# Patient Record
Sex: Female | Born: 1955 | Race: Black or African American | Marital: Single | Smoking: Current every day smoker
Health system: Southern US, Community
[De-identification: ages and names within clinical notes are randomized; demographics above are authoritative.]

## PROBLEM LIST (undated history)

## (undated) DIAGNOSIS — G894 Chronic pain syndrome: Secondary | ICD-10-CM

## (undated) DIAGNOSIS — M810 Age-related osteoporosis without current pathological fracture: Secondary | ICD-10-CM

## (undated) DIAGNOSIS — G4730 Sleep apnea, unspecified: Secondary | ICD-10-CM

## (undated) DIAGNOSIS — F419 Anxiety disorder, unspecified: Secondary | ICD-10-CM

## (undated) DIAGNOSIS — J449 Chronic obstructive pulmonary disease, unspecified: Secondary | ICD-10-CM

## (undated) DIAGNOSIS — K219 Gastro-esophageal reflux disease without esophagitis: Secondary | ICD-10-CM

## (undated) DIAGNOSIS — M1990 Unspecified osteoarthritis, unspecified site: Secondary | ICD-10-CM

## (undated) DIAGNOSIS — I509 Heart failure, unspecified: Secondary | ICD-10-CM

## (undated) DIAGNOSIS — E785 Hyperlipidemia, unspecified: Secondary | ICD-10-CM

## (undated) DIAGNOSIS — I639 Cerebral infarction, unspecified: Secondary | ICD-10-CM

## (undated) DIAGNOSIS — Z5189 Encounter for other specified aftercare: Secondary | ICD-10-CM

## (undated) DIAGNOSIS — I10 Essential (primary) hypertension: Secondary | ICD-10-CM

## (undated) DIAGNOSIS — G709 Myoneural disorder, unspecified: Secondary | ICD-10-CM

## (undated) DIAGNOSIS — E079 Disorder of thyroid, unspecified: Secondary | ICD-10-CM

## (undated) DIAGNOSIS — B1920 Unspecified viral hepatitis C without hepatic coma: Secondary | ICD-10-CM

## (undated) HISTORY — DX: Encounter for other specified aftercare: Z51.89

## (undated) HISTORY — DX: Anxiety disorder, unspecified: F41.9

## (undated) HISTORY — DX: Hyperlipidemia, unspecified: E78.5

## (undated) HISTORY — DX: Sleep apnea, unspecified: G47.30

## (undated) HISTORY — DX: Chronic obstructive pulmonary disease, unspecified: J44.9

## (undated) HISTORY — PX: ABDOMINAL HYSTERECTOMY: SHX81

## (undated) HISTORY — PX: THYROID SURGERY: SHX805

## (undated) HISTORY — PX: CHOLECYSTECTOMY: SHX55

## (undated) HISTORY — DX: Age-related osteoporosis without current pathological fracture: M81.0

## (undated) HISTORY — DX: Disorder of thyroid, unspecified: E07.9

## (undated) HISTORY — PX: ABDOMINAL SURGERY: SHX537

## (undated) HISTORY — DX: Myoneural disorder, unspecified: G70.9

## (undated) HISTORY — DX: Gastro-esophageal reflux disease without esophagitis: K21.9

---

## 1994-11-21 DIAGNOSIS — I639 Cerebral infarction, unspecified: Secondary | ICD-10-CM

## 1994-11-21 HISTORY — DX: Cerebral infarction, unspecified: I63.9

## 2005-03-16 ENCOUNTER — Encounter: Payer: Self-pay | Admitting: Gastroenterology

## 2005-05-09 ENCOUNTER — Encounter: Payer: Self-pay | Admitting: Gastroenterology

## 2008-08-18 ENCOUNTER — Encounter
Admission: RE | Admit: 2008-08-18 | Discharge: 2008-08-18 | Payer: Self-pay | Admitting: Physical Medicine & Rehabilitation

## 2009-06-19 ENCOUNTER — Encounter: Payer: Self-pay | Admitting: Gastroenterology

## 2011-01-21 ENCOUNTER — Encounter: Payer: Self-pay | Admitting: Gastroenterology

## 2011-01-21 HISTORY — PX: ESOPHAGOGASTRODUODENOSCOPY: SHX1529

## 2014-12-26 ENCOUNTER — Encounter: Payer: Self-pay | Admitting: Gastroenterology

## 2014-12-26 HISTORY — PX: COLONOSCOPY: SHX174

## 2015-07-07 DIAGNOSIS — R32 Unspecified urinary incontinence: Secondary | ICD-10-CM | POA: Diagnosis not present

## 2015-07-07 DIAGNOSIS — M170 Bilateral primary osteoarthritis of knee: Secondary | ICD-10-CM | POA: Diagnosis not present

## 2015-07-07 DIAGNOSIS — F11220 Opioid dependence with intoxication, uncomplicated: Secondary | ICD-10-CM | POA: Diagnosis not present

## 2015-07-07 DIAGNOSIS — Z79891 Long term (current) use of opiate analgesic: Secondary | ICD-10-CM | POA: Diagnosis not present

## 2015-07-07 DIAGNOSIS — E538 Deficiency of other specified B group vitamins: Secondary | ICD-10-CM | POA: Diagnosis not present

## 2015-07-07 DIAGNOSIS — G43909 Migraine, unspecified, not intractable, without status migrainosus: Secondary | ICD-10-CM | POA: Diagnosis not present

## 2015-07-07 DIAGNOSIS — M5137 Other intervertebral disc degeneration, lumbosacral region: Secondary | ICD-10-CM | POA: Diagnosis not present

## 2015-07-09 DIAGNOSIS — M25552 Pain in left hip: Secondary | ICD-10-CM | POA: Diagnosis not present

## 2015-07-12 DIAGNOSIS — R32 Unspecified urinary incontinence: Secondary | ICD-10-CM | POA: Diagnosis not present

## 2015-07-12 DIAGNOSIS — I10 Essential (primary) hypertension: Secondary | ICD-10-CM | POA: Diagnosis not present

## 2015-07-12 DIAGNOSIS — F419 Anxiety disorder, unspecified: Secondary | ICD-10-CM | POA: Diagnosis not present

## 2015-07-12 DIAGNOSIS — G43909 Migraine, unspecified, not intractable, without status migrainosus: Secondary | ICD-10-CM | POA: Diagnosis not present

## 2015-07-12 DIAGNOSIS — M19012 Primary osteoarthritis, left shoulder: Secondary | ICD-10-CM | POA: Diagnosis not present

## 2015-07-12 DIAGNOSIS — G8194 Hemiplegia, unspecified affecting left nondominant side: Secondary | ICD-10-CM | POA: Diagnosis not present

## 2015-07-12 DIAGNOSIS — M170 Bilateral primary osteoarthritis of knee: Secondary | ICD-10-CM | POA: Diagnosis not present

## 2015-07-12 DIAGNOSIS — M5137 Other intervertebral disc degeneration, lumbosacral region: Secondary | ICD-10-CM | POA: Diagnosis not present

## 2015-07-12 DIAGNOSIS — J984 Other disorders of lung: Secondary | ICD-10-CM | POA: Diagnosis not present

## 2015-07-12 DIAGNOSIS — I509 Heart failure, unspecified: Secondary | ICD-10-CM | POA: Diagnosis not present

## 2015-07-12 DIAGNOSIS — Z720 Tobacco use: Secondary | ICD-10-CM | POA: Diagnosis not present

## 2015-07-12 DIAGNOSIS — M19011 Primary osteoarthritis, right shoulder: Secondary | ICD-10-CM | POA: Diagnosis not present

## 2015-07-12 DIAGNOSIS — F319 Bipolar disorder, unspecified: Secondary | ICD-10-CM | POA: Diagnosis not present

## 2015-07-13 DIAGNOSIS — M5432 Sciatica, left side: Secondary | ICD-10-CM | POA: Diagnosis not present

## 2015-07-13 DIAGNOSIS — M7062 Trochanteric bursitis, left hip: Secondary | ICD-10-CM | POA: Diagnosis not present

## 2015-07-13 DIAGNOSIS — M5137 Other intervertebral disc degeneration, lumbosacral region: Secondary | ICD-10-CM | POA: Diagnosis not present

## 2015-07-14 DIAGNOSIS — M5137 Other intervertebral disc degeneration, lumbosacral region: Secondary | ICD-10-CM | POA: Diagnosis not present

## 2015-07-15 DIAGNOSIS — I509 Heart failure, unspecified: Secondary | ICD-10-CM | POA: Diagnosis not present

## 2015-07-15 DIAGNOSIS — J984 Other disorders of lung: Secondary | ICD-10-CM | POA: Diagnosis not present

## 2015-07-15 DIAGNOSIS — F419 Anxiety disorder, unspecified: Secondary | ICD-10-CM | POA: Diagnosis not present

## 2015-07-15 DIAGNOSIS — G43909 Migraine, unspecified, not intractable, without status migrainosus: Secondary | ICD-10-CM | POA: Diagnosis not present

## 2015-07-15 DIAGNOSIS — F319 Bipolar disorder, unspecified: Secondary | ICD-10-CM | POA: Diagnosis not present

## 2015-07-15 DIAGNOSIS — I10 Essential (primary) hypertension: Secondary | ICD-10-CM | POA: Diagnosis not present

## 2015-07-15 DIAGNOSIS — Z720 Tobacco use: Secondary | ICD-10-CM | POA: Diagnosis not present

## 2015-07-15 DIAGNOSIS — M170 Bilateral primary osteoarthritis of knee: Secondary | ICD-10-CM | POA: Diagnosis not present

## 2015-07-15 DIAGNOSIS — M5137 Other intervertebral disc degeneration, lumbosacral region: Secondary | ICD-10-CM | POA: Diagnosis not present

## 2015-07-15 DIAGNOSIS — R32 Unspecified urinary incontinence: Secondary | ICD-10-CM | POA: Diagnosis not present

## 2015-07-15 DIAGNOSIS — M19011 Primary osteoarthritis, right shoulder: Secondary | ICD-10-CM | POA: Diagnosis not present

## 2015-07-15 DIAGNOSIS — G8194 Hemiplegia, unspecified affecting left nondominant side: Secondary | ICD-10-CM | POA: Diagnosis not present

## 2015-07-15 DIAGNOSIS — M19012 Primary osteoarthritis, left shoulder: Secondary | ICD-10-CM | POA: Diagnosis not present

## 2015-07-16 DIAGNOSIS — M5137 Other intervertebral disc degeneration, lumbosacral region: Secondary | ICD-10-CM | POA: Diagnosis not present

## 2015-07-17 DIAGNOSIS — M5137 Other intervertebral disc degeneration, lumbosacral region: Secondary | ICD-10-CM | POA: Diagnosis not present

## 2015-07-18 DIAGNOSIS — M5136 Other intervertebral disc degeneration, lumbar region: Secondary | ICD-10-CM | POA: Diagnosis not present

## 2015-07-18 DIAGNOSIS — M5126 Other intervertebral disc displacement, lumbar region: Secondary | ICD-10-CM | POA: Diagnosis not present

## 2015-07-18 DIAGNOSIS — M5137 Other intervertebral disc degeneration, lumbosacral region: Secondary | ICD-10-CM | POA: Diagnosis not present

## 2015-07-19 DIAGNOSIS — M5137 Other intervertebral disc degeneration, lumbosacral region: Secondary | ICD-10-CM | POA: Diagnosis not present

## 2015-07-20 DIAGNOSIS — M5137 Other intervertebral disc degeneration, lumbosacral region: Secondary | ICD-10-CM | POA: Diagnosis not present

## 2015-07-21 DIAGNOSIS — F419 Anxiety disorder, unspecified: Secondary | ICD-10-CM | POA: Diagnosis not present

## 2015-07-21 DIAGNOSIS — G43909 Migraine, unspecified, not intractable, without status migrainosus: Secondary | ICD-10-CM | POA: Diagnosis not present

## 2015-07-21 DIAGNOSIS — I10 Essential (primary) hypertension: Secondary | ICD-10-CM | POA: Diagnosis not present

## 2015-07-21 DIAGNOSIS — M170 Bilateral primary osteoarthritis of knee: Secondary | ICD-10-CM | POA: Diagnosis not present

## 2015-07-21 DIAGNOSIS — M5137 Other intervertebral disc degeneration, lumbosacral region: Secondary | ICD-10-CM | POA: Diagnosis not present

## 2015-07-21 DIAGNOSIS — R32 Unspecified urinary incontinence: Secondary | ICD-10-CM | POA: Diagnosis not present

## 2015-07-21 DIAGNOSIS — F319 Bipolar disorder, unspecified: Secondary | ICD-10-CM | POA: Diagnosis not present

## 2015-07-21 DIAGNOSIS — G8194 Hemiplegia, unspecified affecting left nondominant side: Secondary | ICD-10-CM | POA: Diagnosis not present

## 2015-07-21 DIAGNOSIS — Z720 Tobacco use: Secondary | ICD-10-CM | POA: Diagnosis not present

## 2015-07-21 DIAGNOSIS — M19012 Primary osteoarthritis, left shoulder: Secondary | ICD-10-CM | POA: Diagnosis not present

## 2015-07-21 DIAGNOSIS — J984 Other disorders of lung: Secondary | ICD-10-CM | POA: Diagnosis not present

## 2015-07-21 DIAGNOSIS — M19011 Primary osteoarthritis, right shoulder: Secondary | ICD-10-CM | POA: Diagnosis not present

## 2015-07-21 DIAGNOSIS — I509 Heart failure, unspecified: Secondary | ICD-10-CM | POA: Diagnosis not present

## 2015-07-22 DIAGNOSIS — M5137 Other intervertebral disc degeneration, lumbosacral region: Secondary | ICD-10-CM | POA: Diagnosis not present

## 2015-07-22 DIAGNOSIS — R32 Unspecified urinary incontinence: Secondary | ICD-10-CM | POA: Diagnosis not present

## 2015-07-23 DIAGNOSIS — G8194 Hemiplegia, unspecified affecting left nondominant side: Secondary | ICD-10-CM | POA: Diagnosis not present

## 2015-07-23 DIAGNOSIS — M5137 Other intervertebral disc degeneration, lumbosacral region: Secondary | ICD-10-CM | POA: Diagnosis not present

## 2015-07-23 DIAGNOSIS — M170 Bilateral primary osteoarthritis of knee: Secondary | ICD-10-CM | POA: Diagnosis not present

## 2015-07-23 DIAGNOSIS — I509 Heart failure, unspecified: Secondary | ICD-10-CM | POA: Diagnosis not present

## 2015-07-23 DIAGNOSIS — J984 Other disorders of lung: Secondary | ICD-10-CM | POA: Diagnosis not present

## 2015-07-23 DIAGNOSIS — F319 Bipolar disorder, unspecified: Secondary | ICD-10-CM | POA: Diagnosis not present

## 2015-07-23 DIAGNOSIS — M19011 Primary osteoarthritis, right shoulder: Secondary | ICD-10-CM | POA: Diagnosis not present

## 2015-07-23 DIAGNOSIS — Z720 Tobacco use: Secondary | ICD-10-CM | POA: Diagnosis not present

## 2015-07-23 DIAGNOSIS — F419 Anxiety disorder, unspecified: Secondary | ICD-10-CM | POA: Diagnosis not present

## 2015-07-23 DIAGNOSIS — R32 Unspecified urinary incontinence: Secondary | ICD-10-CM | POA: Diagnosis not present

## 2015-07-23 DIAGNOSIS — G43909 Migraine, unspecified, not intractable, without status migrainosus: Secondary | ICD-10-CM | POA: Diagnosis not present

## 2015-07-23 DIAGNOSIS — M19012 Primary osteoarthritis, left shoulder: Secondary | ICD-10-CM | POA: Diagnosis not present

## 2015-07-23 DIAGNOSIS — I10 Essential (primary) hypertension: Secondary | ICD-10-CM | POA: Diagnosis not present

## 2015-07-24 DIAGNOSIS — M5137 Other intervertebral disc degeneration, lumbosacral region: Secondary | ICD-10-CM | POA: Diagnosis not present

## 2015-07-25 DIAGNOSIS — M5137 Other intervertebral disc degeneration, lumbosacral region: Secondary | ICD-10-CM | POA: Diagnosis not present

## 2015-07-26 DIAGNOSIS — M5137 Other intervertebral disc degeneration, lumbosacral region: Secondary | ICD-10-CM | POA: Diagnosis not present

## 2015-07-27 DIAGNOSIS — M5137 Other intervertebral disc degeneration, lumbosacral region: Secondary | ICD-10-CM | POA: Diagnosis not present

## 2015-07-28 DIAGNOSIS — M5137 Other intervertebral disc degeneration, lumbosacral region: Secondary | ICD-10-CM | POA: Diagnosis not present

## 2015-07-29 DIAGNOSIS — M5137 Other intervertebral disc degeneration, lumbosacral region: Secondary | ICD-10-CM | POA: Diagnosis not present

## 2015-07-29 DIAGNOSIS — I872 Venous insufficiency (chronic) (peripheral): Secondary | ICD-10-CM | POA: Diagnosis not present

## 2015-07-29 DIAGNOSIS — D509 Iron deficiency anemia, unspecified: Secondary | ICD-10-CM | POA: Diagnosis not present

## 2015-07-30 DIAGNOSIS — F319 Bipolar disorder, unspecified: Secondary | ICD-10-CM | POA: Diagnosis not present

## 2015-07-30 DIAGNOSIS — M5137 Other intervertebral disc degeneration, lumbosacral region: Secondary | ICD-10-CM | POA: Diagnosis not present

## 2015-07-30 DIAGNOSIS — G43909 Migraine, unspecified, not intractable, without status migrainosus: Secondary | ICD-10-CM | POA: Diagnosis not present

## 2015-07-30 DIAGNOSIS — F419 Anxiety disorder, unspecified: Secondary | ICD-10-CM | POA: Diagnosis not present

## 2015-07-30 DIAGNOSIS — M170 Bilateral primary osteoarthritis of knee: Secondary | ICD-10-CM | POA: Diagnosis not present

## 2015-07-30 DIAGNOSIS — J984 Other disorders of lung: Secondary | ICD-10-CM | POA: Diagnosis not present

## 2015-07-30 DIAGNOSIS — Z720 Tobacco use: Secondary | ICD-10-CM | POA: Diagnosis not present

## 2015-07-30 DIAGNOSIS — M19011 Primary osteoarthritis, right shoulder: Secondary | ICD-10-CM | POA: Diagnosis not present

## 2015-07-30 DIAGNOSIS — I509 Heart failure, unspecified: Secondary | ICD-10-CM | POA: Diagnosis not present

## 2015-07-30 DIAGNOSIS — G8194 Hemiplegia, unspecified affecting left nondominant side: Secondary | ICD-10-CM | POA: Diagnosis not present

## 2015-07-30 DIAGNOSIS — M19012 Primary osteoarthritis, left shoulder: Secondary | ICD-10-CM | POA: Diagnosis not present

## 2015-07-30 DIAGNOSIS — R32 Unspecified urinary incontinence: Secondary | ICD-10-CM | POA: Diagnosis not present

## 2015-07-30 DIAGNOSIS — I10 Essential (primary) hypertension: Secondary | ICD-10-CM | POA: Diagnosis not present

## 2015-07-31 DIAGNOSIS — M5137 Other intervertebral disc degeneration, lumbosacral region: Secondary | ICD-10-CM | POA: Diagnosis not present

## 2015-08-01 DIAGNOSIS — M5137 Other intervertebral disc degeneration, lumbosacral region: Secondary | ICD-10-CM | POA: Diagnosis not present

## 2015-08-02 DIAGNOSIS — M5137 Other intervertebral disc degeneration, lumbosacral region: Secondary | ICD-10-CM | POA: Diagnosis not present

## 2015-08-03 DIAGNOSIS — G43909 Migraine, unspecified, not intractable, without status migrainosus: Secondary | ICD-10-CM | POA: Diagnosis not present

## 2015-08-03 DIAGNOSIS — M19011 Primary osteoarthritis, right shoulder: Secondary | ICD-10-CM | POA: Diagnosis not present

## 2015-08-03 DIAGNOSIS — F319 Bipolar disorder, unspecified: Secondary | ICD-10-CM | POA: Diagnosis not present

## 2015-08-03 DIAGNOSIS — I10 Essential (primary) hypertension: Secondary | ICD-10-CM | POA: Diagnosis not present

## 2015-08-03 DIAGNOSIS — I509 Heart failure, unspecified: Secondary | ICD-10-CM | POA: Diagnosis not present

## 2015-08-03 DIAGNOSIS — J984 Other disorders of lung: Secondary | ICD-10-CM | POA: Diagnosis not present

## 2015-08-03 DIAGNOSIS — G8194 Hemiplegia, unspecified affecting left nondominant side: Secondary | ICD-10-CM | POA: Diagnosis not present

## 2015-08-03 DIAGNOSIS — M19012 Primary osteoarthritis, left shoulder: Secondary | ICD-10-CM | POA: Diagnosis not present

## 2015-08-03 DIAGNOSIS — Z720 Tobacco use: Secondary | ICD-10-CM | POA: Diagnosis not present

## 2015-08-03 DIAGNOSIS — R32 Unspecified urinary incontinence: Secondary | ICD-10-CM | POA: Diagnosis not present

## 2015-08-03 DIAGNOSIS — F419 Anxiety disorder, unspecified: Secondary | ICD-10-CM | POA: Diagnosis not present

## 2015-08-03 DIAGNOSIS — M5137 Other intervertebral disc degeneration, lumbosacral region: Secondary | ICD-10-CM | POA: Diagnosis not present

## 2015-08-03 DIAGNOSIS — M170 Bilateral primary osteoarthritis of knee: Secondary | ICD-10-CM | POA: Diagnosis not present

## 2015-08-04 DIAGNOSIS — M5137 Other intervertebral disc degeneration, lumbosacral region: Secondary | ICD-10-CM | POA: Diagnosis not present

## 2015-08-05 DIAGNOSIS — M5137 Other intervertebral disc degeneration, lumbosacral region: Secondary | ICD-10-CM | POA: Diagnosis not present

## 2015-08-06 DIAGNOSIS — F11220 Opioid dependence with intoxication, uncomplicated: Secondary | ICD-10-CM | POA: Diagnosis not present

## 2015-08-06 DIAGNOSIS — G8194 Hemiplegia, unspecified affecting left nondominant side: Secondary | ICD-10-CM | POA: Diagnosis not present

## 2015-08-06 DIAGNOSIS — M5137 Other intervertebral disc degeneration, lumbosacral region: Secondary | ICD-10-CM | POA: Diagnosis not present

## 2015-08-06 DIAGNOSIS — M5432 Sciatica, left side: Secondary | ICD-10-CM | POA: Diagnosis not present

## 2015-08-06 DIAGNOSIS — Z23 Encounter for immunization: Secondary | ICD-10-CM | POA: Diagnosis not present

## 2015-08-06 DIAGNOSIS — Z79891 Long term (current) use of opiate analgesic: Secondary | ICD-10-CM | POA: Diagnosis not present

## 2015-08-06 DIAGNOSIS — G43909 Migraine, unspecified, not intractable, without status migrainosus: Secondary | ICD-10-CM | POA: Diagnosis not present

## 2015-08-06 DIAGNOSIS — I63311 Cerebral infarction due to thrombosis of right middle cerebral artery: Secondary | ICD-10-CM | POA: Diagnosis not present

## 2015-08-06 DIAGNOSIS — R32 Unspecified urinary incontinence: Secondary | ICD-10-CM | POA: Diagnosis not present

## 2015-08-07 DIAGNOSIS — M5137 Other intervertebral disc degeneration, lumbosacral region: Secondary | ICD-10-CM | POA: Diagnosis not present

## 2015-08-08 DIAGNOSIS — M5137 Other intervertebral disc degeneration, lumbosacral region: Secondary | ICD-10-CM | POA: Diagnosis not present

## 2015-08-09 DIAGNOSIS — M5137 Other intervertebral disc degeneration, lumbosacral region: Secondary | ICD-10-CM | POA: Diagnosis not present

## 2015-08-10 DIAGNOSIS — M5137 Other intervertebral disc degeneration, lumbosacral region: Secondary | ICD-10-CM | POA: Diagnosis not present

## 2015-08-11 DIAGNOSIS — M5137 Other intervertebral disc degeneration, lumbosacral region: Secondary | ICD-10-CM | POA: Diagnosis not present

## 2015-08-12 DIAGNOSIS — Z720 Tobacco use: Secondary | ICD-10-CM | POA: Diagnosis not present

## 2015-08-12 DIAGNOSIS — M170 Bilateral primary osteoarthritis of knee: Secondary | ICD-10-CM | POA: Diagnosis not present

## 2015-08-12 DIAGNOSIS — J984 Other disorders of lung: Secondary | ICD-10-CM | POA: Diagnosis not present

## 2015-08-12 DIAGNOSIS — M19012 Primary osteoarthritis, left shoulder: Secondary | ICD-10-CM | POA: Diagnosis not present

## 2015-08-12 DIAGNOSIS — F419 Anxiety disorder, unspecified: Secondary | ICD-10-CM | POA: Diagnosis not present

## 2015-08-12 DIAGNOSIS — M19011 Primary osteoarthritis, right shoulder: Secondary | ICD-10-CM | POA: Diagnosis not present

## 2015-08-12 DIAGNOSIS — R32 Unspecified urinary incontinence: Secondary | ICD-10-CM | POA: Diagnosis not present

## 2015-08-12 DIAGNOSIS — I509 Heart failure, unspecified: Secondary | ICD-10-CM | POA: Diagnosis not present

## 2015-08-12 DIAGNOSIS — M5137 Other intervertebral disc degeneration, lumbosacral region: Secondary | ICD-10-CM | POA: Diagnosis not present

## 2015-08-12 DIAGNOSIS — G8194 Hemiplegia, unspecified affecting left nondominant side: Secondary | ICD-10-CM | POA: Diagnosis not present

## 2015-08-12 DIAGNOSIS — G43909 Migraine, unspecified, not intractable, without status migrainosus: Secondary | ICD-10-CM | POA: Diagnosis not present

## 2015-08-12 DIAGNOSIS — F319 Bipolar disorder, unspecified: Secondary | ICD-10-CM | POA: Diagnosis not present

## 2015-08-12 DIAGNOSIS — I10 Essential (primary) hypertension: Secondary | ICD-10-CM | POA: Diagnosis not present

## 2015-08-13 DIAGNOSIS — M5137 Other intervertebral disc degeneration, lumbosacral region: Secondary | ICD-10-CM | POA: Diagnosis not present

## 2015-08-14 DIAGNOSIS — M19012 Primary osteoarthritis, left shoulder: Secondary | ICD-10-CM | POA: Diagnosis not present

## 2015-08-14 DIAGNOSIS — G8194 Hemiplegia, unspecified affecting left nondominant side: Secondary | ICD-10-CM | POA: Diagnosis not present

## 2015-08-14 DIAGNOSIS — G43909 Migraine, unspecified, not intractable, without status migrainosus: Secondary | ICD-10-CM | POA: Diagnosis not present

## 2015-08-14 DIAGNOSIS — I10 Essential (primary) hypertension: Secondary | ICD-10-CM | POA: Diagnosis not present

## 2015-08-14 DIAGNOSIS — M5137 Other intervertebral disc degeneration, lumbosacral region: Secondary | ICD-10-CM | POA: Diagnosis not present

## 2015-08-14 DIAGNOSIS — M19011 Primary osteoarthritis, right shoulder: Secondary | ICD-10-CM | POA: Diagnosis not present

## 2015-08-14 DIAGNOSIS — Z720 Tobacco use: Secondary | ICD-10-CM | POA: Diagnosis not present

## 2015-08-14 DIAGNOSIS — F419 Anxiety disorder, unspecified: Secondary | ICD-10-CM | POA: Diagnosis not present

## 2015-08-14 DIAGNOSIS — I509 Heart failure, unspecified: Secondary | ICD-10-CM | POA: Diagnosis not present

## 2015-08-14 DIAGNOSIS — R32 Unspecified urinary incontinence: Secondary | ICD-10-CM | POA: Diagnosis not present

## 2015-08-14 DIAGNOSIS — J984 Other disorders of lung: Secondary | ICD-10-CM | POA: Diagnosis not present

## 2015-08-14 DIAGNOSIS — F319 Bipolar disorder, unspecified: Secondary | ICD-10-CM | POA: Diagnosis not present

## 2015-08-14 DIAGNOSIS — M170 Bilateral primary osteoarthritis of knee: Secondary | ICD-10-CM | POA: Diagnosis not present

## 2015-08-15 DIAGNOSIS — M5137 Other intervertebral disc degeneration, lumbosacral region: Secondary | ICD-10-CM | POA: Diagnosis not present

## 2015-08-16 DIAGNOSIS — M5137 Other intervertebral disc degeneration, lumbosacral region: Secondary | ICD-10-CM | POA: Diagnosis not present

## 2015-08-17 DIAGNOSIS — M5137 Other intervertebral disc degeneration, lumbosacral region: Secondary | ICD-10-CM | POA: Diagnosis not present

## 2015-08-18 DIAGNOSIS — M19011 Primary osteoarthritis, right shoulder: Secondary | ICD-10-CM | POA: Diagnosis not present

## 2015-08-18 DIAGNOSIS — G43909 Migraine, unspecified, not intractable, without status migrainosus: Secondary | ICD-10-CM | POA: Diagnosis not present

## 2015-08-18 DIAGNOSIS — M5137 Other intervertebral disc degeneration, lumbosacral region: Secondary | ICD-10-CM | POA: Diagnosis not present

## 2015-08-18 DIAGNOSIS — I10 Essential (primary) hypertension: Secondary | ICD-10-CM | POA: Diagnosis not present

## 2015-08-18 DIAGNOSIS — F319 Bipolar disorder, unspecified: Secondary | ICD-10-CM | POA: Diagnosis not present

## 2015-08-18 DIAGNOSIS — I509 Heart failure, unspecified: Secondary | ICD-10-CM | POA: Diagnosis not present

## 2015-08-18 DIAGNOSIS — Z720 Tobacco use: Secondary | ICD-10-CM | POA: Diagnosis not present

## 2015-08-18 DIAGNOSIS — F419 Anxiety disorder, unspecified: Secondary | ICD-10-CM | POA: Diagnosis not present

## 2015-08-18 DIAGNOSIS — G8194 Hemiplegia, unspecified affecting left nondominant side: Secondary | ICD-10-CM | POA: Diagnosis not present

## 2015-08-18 DIAGNOSIS — M19012 Primary osteoarthritis, left shoulder: Secondary | ICD-10-CM | POA: Diagnosis not present

## 2015-08-18 DIAGNOSIS — R32 Unspecified urinary incontinence: Secondary | ICD-10-CM | POA: Diagnosis not present

## 2015-08-18 DIAGNOSIS — M170 Bilateral primary osteoarthritis of knee: Secondary | ICD-10-CM | POA: Diagnosis not present

## 2015-08-18 DIAGNOSIS — J984 Other disorders of lung: Secondary | ICD-10-CM | POA: Diagnosis not present

## 2015-08-19 DIAGNOSIS — M5137 Other intervertebral disc degeneration, lumbosacral region: Secondary | ICD-10-CM | POA: Diagnosis not present

## 2015-08-20 DIAGNOSIS — M5137 Other intervertebral disc degeneration, lumbosacral region: Secondary | ICD-10-CM | POA: Diagnosis not present

## 2015-08-21 DIAGNOSIS — M5137 Other intervertebral disc degeneration, lumbosacral region: Secondary | ICD-10-CM | POA: Diagnosis not present

## 2015-08-21 DIAGNOSIS — R32 Unspecified urinary incontinence: Secondary | ICD-10-CM | POA: Diagnosis not present

## 2015-08-22 DIAGNOSIS — M5137 Other intervertebral disc degeneration, lumbosacral region: Secondary | ICD-10-CM | POA: Diagnosis not present

## 2015-08-23 DIAGNOSIS — M5137 Other intervertebral disc degeneration, lumbosacral region: Secondary | ICD-10-CM | POA: Diagnosis not present

## 2015-08-24 DIAGNOSIS — M5137 Other intervertebral disc degeneration, lumbosacral region: Secondary | ICD-10-CM | POA: Diagnosis not present

## 2015-08-25 DIAGNOSIS — F419 Anxiety disorder, unspecified: Secondary | ICD-10-CM | POA: Diagnosis not present

## 2015-08-25 DIAGNOSIS — M19011 Primary osteoarthritis, right shoulder: Secondary | ICD-10-CM | POA: Diagnosis not present

## 2015-08-25 DIAGNOSIS — F319 Bipolar disorder, unspecified: Secondary | ICD-10-CM | POA: Diagnosis not present

## 2015-08-25 DIAGNOSIS — R32 Unspecified urinary incontinence: Secondary | ICD-10-CM | POA: Diagnosis not present

## 2015-08-25 DIAGNOSIS — M170 Bilateral primary osteoarthritis of knee: Secondary | ICD-10-CM | POA: Diagnosis not present

## 2015-08-25 DIAGNOSIS — I509 Heart failure, unspecified: Secondary | ICD-10-CM | POA: Diagnosis not present

## 2015-08-25 DIAGNOSIS — G43909 Migraine, unspecified, not intractable, without status migrainosus: Secondary | ICD-10-CM | POA: Diagnosis not present

## 2015-08-25 DIAGNOSIS — Z720 Tobacco use: Secondary | ICD-10-CM | POA: Diagnosis not present

## 2015-08-25 DIAGNOSIS — M5137 Other intervertebral disc degeneration, lumbosacral region: Secondary | ICD-10-CM | POA: Diagnosis not present

## 2015-08-25 DIAGNOSIS — I10 Essential (primary) hypertension: Secondary | ICD-10-CM | POA: Diagnosis not present

## 2015-08-25 DIAGNOSIS — J984 Other disorders of lung: Secondary | ICD-10-CM | POA: Diagnosis not present

## 2015-08-25 DIAGNOSIS — M19012 Primary osteoarthritis, left shoulder: Secondary | ICD-10-CM | POA: Diagnosis not present

## 2015-08-25 DIAGNOSIS — G8194 Hemiplegia, unspecified affecting left nondominant side: Secondary | ICD-10-CM | POA: Diagnosis not present

## 2015-08-26 DIAGNOSIS — J984 Other disorders of lung: Secondary | ICD-10-CM | POA: Diagnosis not present

## 2015-08-26 DIAGNOSIS — F319 Bipolar disorder, unspecified: Secondary | ICD-10-CM | POA: Diagnosis not present

## 2015-08-26 DIAGNOSIS — M170 Bilateral primary osteoarthritis of knee: Secondary | ICD-10-CM | POA: Diagnosis not present

## 2015-08-26 DIAGNOSIS — M19011 Primary osteoarthritis, right shoulder: Secondary | ICD-10-CM | POA: Diagnosis not present

## 2015-08-26 DIAGNOSIS — G43909 Migraine, unspecified, not intractable, without status migrainosus: Secondary | ICD-10-CM | POA: Diagnosis not present

## 2015-08-26 DIAGNOSIS — F419 Anxiety disorder, unspecified: Secondary | ICD-10-CM | POA: Diagnosis not present

## 2015-08-26 DIAGNOSIS — M5137 Other intervertebral disc degeneration, lumbosacral region: Secondary | ICD-10-CM | POA: Diagnosis not present

## 2015-08-26 DIAGNOSIS — Z720 Tobacco use: Secondary | ICD-10-CM | POA: Diagnosis not present

## 2015-08-26 DIAGNOSIS — R32 Unspecified urinary incontinence: Secondary | ICD-10-CM | POA: Diagnosis not present

## 2015-08-26 DIAGNOSIS — M19012 Primary osteoarthritis, left shoulder: Secondary | ICD-10-CM | POA: Diagnosis not present

## 2015-08-26 DIAGNOSIS — I10 Essential (primary) hypertension: Secondary | ICD-10-CM | POA: Diagnosis not present

## 2015-08-26 DIAGNOSIS — G8194 Hemiplegia, unspecified affecting left nondominant side: Secondary | ICD-10-CM | POA: Diagnosis not present

## 2015-08-26 DIAGNOSIS — I509 Heart failure, unspecified: Secondary | ICD-10-CM | POA: Diagnosis not present

## 2015-08-27 DIAGNOSIS — G43909 Migraine, unspecified, not intractable, without status migrainosus: Secondary | ICD-10-CM | POA: Diagnosis not present

## 2015-08-27 DIAGNOSIS — I509 Heart failure, unspecified: Secondary | ICD-10-CM | POA: Diagnosis not present

## 2015-08-27 DIAGNOSIS — M19012 Primary osteoarthritis, left shoulder: Secondary | ICD-10-CM | POA: Diagnosis not present

## 2015-08-27 DIAGNOSIS — F319 Bipolar disorder, unspecified: Secondary | ICD-10-CM | POA: Diagnosis not present

## 2015-08-27 DIAGNOSIS — Z720 Tobacco use: Secondary | ICD-10-CM | POA: Diagnosis not present

## 2015-08-27 DIAGNOSIS — M170 Bilateral primary osteoarthritis of knee: Secondary | ICD-10-CM | POA: Diagnosis not present

## 2015-08-27 DIAGNOSIS — M19011 Primary osteoarthritis, right shoulder: Secondary | ICD-10-CM | POA: Diagnosis not present

## 2015-08-27 DIAGNOSIS — F419 Anxiety disorder, unspecified: Secondary | ICD-10-CM | POA: Diagnosis not present

## 2015-08-27 DIAGNOSIS — I10 Essential (primary) hypertension: Secondary | ICD-10-CM | POA: Diagnosis not present

## 2015-08-27 DIAGNOSIS — R32 Unspecified urinary incontinence: Secondary | ICD-10-CM | POA: Diagnosis not present

## 2015-08-27 DIAGNOSIS — J984 Other disorders of lung: Secondary | ICD-10-CM | POA: Diagnosis not present

## 2015-08-27 DIAGNOSIS — G8194 Hemiplegia, unspecified affecting left nondominant side: Secondary | ICD-10-CM | POA: Diagnosis not present

## 2015-08-27 DIAGNOSIS — M5137 Other intervertebral disc degeneration, lumbosacral region: Secondary | ICD-10-CM | POA: Diagnosis not present

## 2015-08-28 DIAGNOSIS — M5137 Other intervertebral disc degeneration, lumbosacral region: Secondary | ICD-10-CM | POA: Diagnosis not present

## 2015-08-29 DIAGNOSIS — M5137 Other intervertebral disc degeneration, lumbosacral region: Secondary | ICD-10-CM | POA: Diagnosis not present

## 2015-08-30 DIAGNOSIS — M5137 Other intervertebral disc degeneration, lumbosacral region: Secondary | ICD-10-CM | POA: Diagnosis not present

## 2015-08-31 DIAGNOSIS — M5137 Other intervertebral disc degeneration, lumbosacral region: Secondary | ICD-10-CM | POA: Diagnosis not present

## 2015-09-01 DIAGNOSIS — I509 Heart failure, unspecified: Secondary | ICD-10-CM | POA: Diagnosis not present

## 2015-09-01 DIAGNOSIS — G43909 Migraine, unspecified, not intractable, without status migrainosus: Secondary | ICD-10-CM | POA: Diagnosis not present

## 2015-09-01 DIAGNOSIS — J984 Other disorders of lung: Secondary | ICD-10-CM | POA: Diagnosis not present

## 2015-09-01 DIAGNOSIS — F319 Bipolar disorder, unspecified: Secondary | ICD-10-CM | POA: Diagnosis not present

## 2015-09-01 DIAGNOSIS — Z720 Tobacco use: Secondary | ICD-10-CM | POA: Diagnosis not present

## 2015-09-01 DIAGNOSIS — M5137 Other intervertebral disc degeneration, lumbosacral region: Secondary | ICD-10-CM | POA: Diagnosis not present

## 2015-09-01 DIAGNOSIS — G8194 Hemiplegia, unspecified affecting left nondominant side: Secondary | ICD-10-CM | POA: Diagnosis not present

## 2015-09-01 DIAGNOSIS — I10 Essential (primary) hypertension: Secondary | ICD-10-CM | POA: Diagnosis not present

## 2015-09-01 DIAGNOSIS — F419 Anxiety disorder, unspecified: Secondary | ICD-10-CM | POA: Diagnosis not present

## 2015-09-01 DIAGNOSIS — M19011 Primary osteoarthritis, right shoulder: Secondary | ICD-10-CM | POA: Diagnosis not present

## 2015-09-01 DIAGNOSIS — R32 Unspecified urinary incontinence: Secondary | ICD-10-CM | POA: Diagnosis not present

## 2015-09-01 DIAGNOSIS — M170 Bilateral primary osteoarthritis of knee: Secondary | ICD-10-CM | POA: Diagnosis not present

## 2015-09-01 DIAGNOSIS — M19012 Primary osteoarthritis, left shoulder: Secondary | ICD-10-CM | POA: Diagnosis not present

## 2015-09-02 DIAGNOSIS — M5137 Other intervertebral disc degeneration, lumbosacral region: Secondary | ICD-10-CM | POA: Diagnosis not present

## 2015-09-03 DIAGNOSIS — M170 Bilateral primary osteoarthritis of knee: Secondary | ICD-10-CM | POA: Diagnosis not present

## 2015-09-03 DIAGNOSIS — M5137 Other intervertebral disc degeneration, lumbosacral region: Secondary | ICD-10-CM | POA: Diagnosis not present

## 2015-09-03 DIAGNOSIS — Z79899 Other long term (current) drug therapy: Secondary | ICD-10-CM | POA: Diagnosis not present

## 2015-09-03 DIAGNOSIS — Z79891 Long term (current) use of opiate analgesic: Secondary | ICD-10-CM | POA: Diagnosis not present

## 2015-09-03 DIAGNOSIS — G8194 Hemiplegia, unspecified affecting left nondominant side: Secondary | ICD-10-CM | POA: Diagnosis not present

## 2015-09-03 DIAGNOSIS — M19012 Primary osteoarthritis, left shoulder: Secondary | ICD-10-CM | POA: Diagnosis not present

## 2015-09-03 DIAGNOSIS — M19011 Primary osteoarthritis, right shoulder: Secondary | ICD-10-CM | POA: Diagnosis not present

## 2015-09-04 DIAGNOSIS — M5137 Other intervertebral disc degeneration, lumbosacral region: Secondary | ICD-10-CM | POA: Diagnosis not present

## 2015-09-05 DIAGNOSIS — M5137 Other intervertebral disc degeneration, lumbosacral region: Secondary | ICD-10-CM | POA: Diagnosis not present

## 2015-09-06 DIAGNOSIS — M5137 Other intervertebral disc degeneration, lumbosacral region: Secondary | ICD-10-CM | POA: Diagnosis not present

## 2015-09-07 DIAGNOSIS — G43909 Migraine, unspecified, not intractable, without status migrainosus: Secondary | ICD-10-CM | POA: Diagnosis not present

## 2015-09-07 DIAGNOSIS — M19011 Primary osteoarthritis, right shoulder: Secondary | ICD-10-CM | POA: Diagnosis not present

## 2015-09-07 DIAGNOSIS — Z792 Long term (current) use of antibiotics: Secondary | ICD-10-CM | POA: Diagnosis not present

## 2015-09-07 DIAGNOSIS — F319 Bipolar disorder, unspecified: Secondary | ICD-10-CM | POA: Diagnosis not present

## 2015-09-07 DIAGNOSIS — F419 Anxiety disorder, unspecified: Secondary | ICD-10-CM | POA: Diagnosis not present

## 2015-09-07 DIAGNOSIS — I110 Hypertensive heart disease with heart failure: Secondary | ICD-10-CM | POA: Diagnosis not present

## 2015-09-07 DIAGNOSIS — M5137 Other intervertebral disc degeneration, lumbosacral region: Secondary | ICD-10-CM | POA: Diagnosis not present

## 2015-09-07 DIAGNOSIS — I509 Heart failure, unspecified: Secondary | ICD-10-CM | POA: Diagnosis not present

## 2015-09-07 DIAGNOSIS — I69354 Hemiplegia and hemiparesis following cerebral infarction affecting left non-dominant side: Secondary | ICD-10-CM | POA: Diagnosis not present

## 2015-09-07 DIAGNOSIS — M19012 Primary osteoarthritis, left shoulder: Secondary | ICD-10-CM | POA: Diagnosis not present

## 2015-09-07 DIAGNOSIS — Z7902 Long term (current) use of antithrombotics/antiplatelets: Secondary | ICD-10-CM | POA: Diagnosis not present

## 2015-09-07 DIAGNOSIS — J449 Chronic obstructive pulmonary disease, unspecified: Secondary | ICD-10-CM | POA: Diagnosis not present

## 2015-09-07 DIAGNOSIS — M170 Bilateral primary osteoarthritis of knee: Secondary | ICD-10-CM | POA: Diagnosis not present

## 2015-09-08 DIAGNOSIS — M5137 Other intervertebral disc degeneration, lumbosacral region: Secondary | ICD-10-CM | POA: Diagnosis not present

## 2015-09-09 DIAGNOSIS — M5137 Other intervertebral disc degeneration, lumbosacral region: Secondary | ICD-10-CM | POA: Diagnosis not present

## 2015-09-10 DIAGNOSIS — M5137 Other intervertebral disc degeneration, lumbosacral region: Secondary | ICD-10-CM | POA: Diagnosis not present

## 2015-09-11 DIAGNOSIS — M5416 Radiculopathy, lumbar region: Secondary | ICD-10-CM | POA: Diagnosis not present

## 2015-09-11 DIAGNOSIS — M5137 Other intervertebral disc degeneration, lumbosacral region: Secondary | ICD-10-CM | POA: Diagnosis not present

## 2015-09-12 DIAGNOSIS — M5137 Other intervertebral disc degeneration, lumbosacral region: Secondary | ICD-10-CM | POA: Diagnosis not present

## 2015-09-13 DIAGNOSIS — M5137 Other intervertebral disc degeneration, lumbosacral region: Secondary | ICD-10-CM | POA: Diagnosis not present

## 2015-09-13 DIAGNOSIS — M5416 Radiculopathy, lumbar region: Secondary | ICD-10-CM | POA: Diagnosis not present

## 2015-09-13 DIAGNOSIS — M5441 Lumbago with sciatica, right side: Secondary | ICD-10-CM | POA: Diagnosis not present

## 2015-09-14 DIAGNOSIS — M170 Bilateral primary osteoarthritis of knee: Secondary | ICD-10-CM | POA: Diagnosis not present

## 2015-09-14 DIAGNOSIS — Z7902 Long term (current) use of antithrombotics/antiplatelets: Secondary | ICD-10-CM | POA: Diagnosis not present

## 2015-09-14 DIAGNOSIS — M19011 Primary osteoarthritis, right shoulder: Secondary | ICD-10-CM | POA: Diagnosis not present

## 2015-09-14 DIAGNOSIS — I110 Hypertensive heart disease with heart failure: Secondary | ICD-10-CM | POA: Diagnosis not present

## 2015-09-14 DIAGNOSIS — G43909 Migraine, unspecified, not intractable, without status migrainosus: Secondary | ICD-10-CM | POA: Diagnosis not present

## 2015-09-14 DIAGNOSIS — I509 Heart failure, unspecified: Secondary | ICD-10-CM | POA: Diagnosis not present

## 2015-09-14 DIAGNOSIS — Z792 Long term (current) use of antibiotics: Secondary | ICD-10-CM | POA: Diagnosis not present

## 2015-09-14 DIAGNOSIS — J449 Chronic obstructive pulmonary disease, unspecified: Secondary | ICD-10-CM | POA: Diagnosis not present

## 2015-09-14 DIAGNOSIS — M19012 Primary osteoarthritis, left shoulder: Secondary | ICD-10-CM | POA: Diagnosis not present

## 2015-09-14 DIAGNOSIS — M5137 Other intervertebral disc degeneration, lumbosacral region: Secondary | ICD-10-CM | POA: Diagnosis not present

## 2015-09-14 DIAGNOSIS — I69354 Hemiplegia and hemiparesis following cerebral infarction affecting left non-dominant side: Secondary | ICD-10-CM | POA: Diagnosis not present

## 2015-09-14 DIAGNOSIS — F319 Bipolar disorder, unspecified: Secondary | ICD-10-CM | POA: Diagnosis not present

## 2015-09-14 DIAGNOSIS — F419 Anxiety disorder, unspecified: Secondary | ICD-10-CM | POA: Diagnosis not present

## 2015-09-15 DIAGNOSIS — M5137 Other intervertebral disc degeneration, lumbosacral region: Secondary | ICD-10-CM | POA: Diagnosis not present

## 2015-09-16 DIAGNOSIS — M5137 Other intervertebral disc degeneration, lumbosacral region: Secondary | ICD-10-CM | POA: Diagnosis not present

## 2015-09-17 DIAGNOSIS — M5137 Other intervertebral disc degeneration, lumbosacral region: Secondary | ICD-10-CM | POA: Diagnosis not present

## 2015-09-17 DIAGNOSIS — M5416 Radiculopathy, lumbar region: Secondary | ICD-10-CM | POA: Diagnosis not present

## 2015-09-18 DIAGNOSIS — M19012 Primary osteoarthritis, left shoulder: Secondary | ICD-10-CM | POA: Diagnosis not present

## 2015-09-18 DIAGNOSIS — F419 Anxiety disorder, unspecified: Secondary | ICD-10-CM | POA: Diagnosis not present

## 2015-09-18 DIAGNOSIS — I69354 Hemiplegia and hemiparesis following cerebral infarction affecting left non-dominant side: Secondary | ICD-10-CM | POA: Diagnosis not present

## 2015-09-18 DIAGNOSIS — M170 Bilateral primary osteoarthritis of knee: Secondary | ICD-10-CM | POA: Diagnosis not present

## 2015-09-18 DIAGNOSIS — J449 Chronic obstructive pulmonary disease, unspecified: Secondary | ICD-10-CM | POA: Diagnosis not present

## 2015-09-18 DIAGNOSIS — F319 Bipolar disorder, unspecified: Secondary | ICD-10-CM | POA: Diagnosis not present

## 2015-09-18 DIAGNOSIS — M5137 Other intervertebral disc degeneration, lumbosacral region: Secondary | ICD-10-CM | POA: Diagnosis not present

## 2015-09-18 DIAGNOSIS — I110 Hypertensive heart disease with heart failure: Secondary | ICD-10-CM | POA: Diagnosis not present

## 2015-09-18 DIAGNOSIS — Z7902 Long term (current) use of antithrombotics/antiplatelets: Secondary | ICD-10-CM | POA: Diagnosis not present

## 2015-09-18 DIAGNOSIS — M19011 Primary osteoarthritis, right shoulder: Secondary | ICD-10-CM | POA: Diagnosis not present

## 2015-09-18 DIAGNOSIS — I509 Heart failure, unspecified: Secondary | ICD-10-CM | POA: Diagnosis not present

## 2015-09-18 DIAGNOSIS — G43909 Migraine, unspecified, not intractable, without status migrainosus: Secondary | ICD-10-CM | POA: Diagnosis not present

## 2015-09-18 DIAGNOSIS — Z792 Long term (current) use of antibiotics: Secondary | ICD-10-CM | POA: Diagnosis not present

## 2015-09-19 DIAGNOSIS — M5137 Other intervertebral disc degeneration, lumbosacral region: Secondary | ICD-10-CM | POA: Diagnosis not present

## 2015-09-20 DIAGNOSIS — M5137 Other intervertebral disc degeneration, lumbosacral region: Secondary | ICD-10-CM | POA: Diagnosis not present

## 2015-09-21 DIAGNOSIS — M5137 Other intervertebral disc degeneration, lumbosacral region: Secondary | ICD-10-CM | POA: Diagnosis not present

## 2015-09-22 DIAGNOSIS — J209 Acute bronchitis, unspecified: Secondary | ICD-10-CM | POA: Diagnosis not present

## 2015-09-22 DIAGNOSIS — R197 Diarrhea, unspecified: Secondary | ICD-10-CM | POA: Diagnosis not present

## 2015-09-22 DIAGNOSIS — J069 Acute upper respiratory infection, unspecified: Secondary | ICD-10-CM | POA: Diagnosis not present

## 2015-09-22 DIAGNOSIS — R05 Cough: Secondary | ICD-10-CM | POA: Diagnosis not present

## 2015-09-22 DIAGNOSIS — R112 Nausea with vomiting, unspecified: Secondary | ICD-10-CM | POA: Diagnosis not present

## 2015-09-25 DIAGNOSIS — M19012 Primary osteoarthritis, left shoulder: Secondary | ICD-10-CM | POA: Diagnosis not present

## 2015-09-25 DIAGNOSIS — M19011 Primary osteoarthritis, right shoulder: Secondary | ICD-10-CM | POA: Diagnosis not present

## 2015-09-25 DIAGNOSIS — J449 Chronic obstructive pulmonary disease, unspecified: Secondary | ICD-10-CM | POA: Diagnosis not present

## 2015-09-25 DIAGNOSIS — I110 Hypertensive heart disease with heart failure: Secondary | ICD-10-CM | POA: Diagnosis not present

## 2015-09-25 DIAGNOSIS — G43909 Migraine, unspecified, not intractable, without status migrainosus: Secondary | ICD-10-CM | POA: Diagnosis not present

## 2015-09-25 DIAGNOSIS — F419 Anxiety disorder, unspecified: Secondary | ICD-10-CM | POA: Diagnosis not present

## 2015-09-25 DIAGNOSIS — I509 Heart failure, unspecified: Secondary | ICD-10-CM | POA: Diagnosis not present

## 2015-09-25 DIAGNOSIS — I69354 Hemiplegia and hemiparesis following cerebral infarction affecting left non-dominant side: Secondary | ICD-10-CM | POA: Diagnosis not present

## 2015-09-25 DIAGNOSIS — M170 Bilateral primary osteoarthritis of knee: Secondary | ICD-10-CM | POA: Diagnosis not present

## 2015-09-25 DIAGNOSIS — Z7902 Long term (current) use of antithrombotics/antiplatelets: Secondary | ICD-10-CM | POA: Diagnosis not present

## 2015-09-25 DIAGNOSIS — F319 Bipolar disorder, unspecified: Secondary | ICD-10-CM | POA: Diagnosis not present

## 2015-09-25 DIAGNOSIS — Z792 Long term (current) use of antibiotics: Secondary | ICD-10-CM | POA: Diagnosis not present

## 2015-09-25 DIAGNOSIS — M5137 Other intervertebral disc degeneration, lumbosacral region: Secondary | ICD-10-CM | POA: Diagnosis not present

## 2015-09-28 DIAGNOSIS — G43909 Migraine, unspecified, not intractable, without status migrainosus: Secondary | ICD-10-CM | POA: Diagnosis not present

## 2015-09-28 DIAGNOSIS — Z7982 Long term (current) use of aspirin: Secondary | ICD-10-CM | POA: Diagnosis not present

## 2015-09-28 DIAGNOSIS — I69354 Hemiplegia and hemiparesis following cerebral infarction affecting left non-dominant side: Secondary | ICD-10-CM | POA: Diagnosis not present

## 2015-09-28 DIAGNOSIS — F319 Bipolar disorder, unspecified: Secondary | ICD-10-CM | POA: Diagnosis not present

## 2015-09-28 DIAGNOSIS — M19012 Primary osteoarthritis, left shoulder: Secondary | ICD-10-CM | POA: Diagnosis not present

## 2015-09-28 DIAGNOSIS — I509 Heart failure, unspecified: Secondary | ICD-10-CM | POA: Diagnosis not present

## 2015-09-28 DIAGNOSIS — M170 Bilateral primary osteoarthritis of knee: Secondary | ICD-10-CM | POA: Diagnosis not present

## 2015-09-28 DIAGNOSIS — I110 Hypertensive heart disease with heart failure: Secondary | ICD-10-CM | POA: Diagnosis not present

## 2015-09-28 DIAGNOSIS — Z792 Long term (current) use of antibiotics: Secondary | ICD-10-CM | POA: Diagnosis not present

## 2015-09-28 DIAGNOSIS — F419 Anxiety disorder, unspecified: Secondary | ICD-10-CM | POA: Diagnosis not present

## 2015-09-28 DIAGNOSIS — M5137 Other intervertebral disc degeneration, lumbosacral region: Secondary | ICD-10-CM | POA: Diagnosis not present

## 2015-09-28 DIAGNOSIS — Z7902 Long term (current) use of antithrombotics/antiplatelets: Secondary | ICD-10-CM | POA: Diagnosis not present

## 2015-09-28 DIAGNOSIS — J449 Chronic obstructive pulmonary disease, unspecified: Secondary | ICD-10-CM | POA: Diagnosis not present

## 2015-09-28 DIAGNOSIS — Z9181 History of falling: Secondary | ICD-10-CM | POA: Diagnosis not present

## 2015-09-28 DIAGNOSIS — M19011 Primary osteoarthritis, right shoulder: Secondary | ICD-10-CM | POA: Diagnosis not present

## 2015-09-29 DIAGNOSIS — F319 Bipolar disorder, unspecified: Secondary | ICD-10-CM | POA: Diagnosis not present

## 2015-09-29 DIAGNOSIS — M19012 Primary osteoarthritis, left shoulder: Secondary | ICD-10-CM | POA: Diagnosis not present

## 2015-09-29 DIAGNOSIS — M170 Bilateral primary osteoarthritis of knee: Secondary | ICD-10-CM | POA: Diagnosis not present

## 2015-09-29 DIAGNOSIS — I509 Heart failure, unspecified: Secondary | ICD-10-CM | POA: Diagnosis not present

## 2015-09-29 DIAGNOSIS — I110 Hypertensive heart disease with heart failure: Secondary | ICD-10-CM | POA: Diagnosis not present

## 2015-09-29 DIAGNOSIS — J449 Chronic obstructive pulmonary disease, unspecified: Secondary | ICD-10-CM | POA: Diagnosis not present

## 2015-09-29 DIAGNOSIS — I69354 Hemiplegia and hemiparesis following cerebral infarction affecting left non-dominant side: Secondary | ICD-10-CM | POA: Diagnosis not present

## 2015-09-29 DIAGNOSIS — M5137 Other intervertebral disc degeneration, lumbosacral region: Secondary | ICD-10-CM | POA: Diagnosis not present

## 2015-09-29 DIAGNOSIS — F419 Anxiety disorder, unspecified: Secondary | ICD-10-CM | POA: Diagnosis not present

## 2015-09-29 DIAGNOSIS — Z9181 History of falling: Secondary | ICD-10-CM | POA: Diagnosis not present

## 2015-09-29 DIAGNOSIS — Z7902 Long term (current) use of antithrombotics/antiplatelets: Secondary | ICD-10-CM | POA: Diagnosis not present

## 2015-09-29 DIAGNOSIS — Z7982 Long term (current) use of aspirin: Secondary | ICD-10-CM | POA: Diagnosis not present

## 2015-09-29 DIAGNOSIS — M19011 Primary osteoarthritis, right shoulder: Secondary | ICD-10-CM | POA: Diagnosis not present

## 2015-09-29 DIAGNOSIS — Z792 Long term (current) use of antibiotics: Secondary | ICD-10-CM | POA: Diagnosis not present

## 2015-09-29 DIAGNOSIS — G43909 Migraine, unspecified, not intractable, without status migrainosus: Secondary | ICD-10-CM | POA: Diagnosis not present

## 2015-10-05 DIAGNOSIS — G8194 Hemiplegia, unspecified affecting left nondominant side: Secondary | ICD-10-CM | POA: Diagnosis not present

## 2015-10-05 DIAGNOSIS — Z6828 Body mass index (BMI) 28.0-28.9, adult: Secondary | ICD-10-CM | POA: Diagnosis not present

## 2015-10-05 DIAGNOSIS — M5137 Other intervertebral disc degeneration, lumbosacral region: Secondary | ICD-10-CM | POA: Diagnosis not present

## 2015-10-05 DIAGNOSIS — M170 Bilateral primary osteoarthritis of knee: Secondary | ICD-10-CM | POA: Diagnosis not present

## 2015-10-05 DIAGNOSIS — F11220 Opioid dependence with intoxication, uncomplicated: Secondary | ICD-10-CM | POA: Diagnosis not present

## 2015-10-05 DIAGNOSIS — Z79891 Long term (current) use of opiate analgesic: Secondary | ICD-10-CM | POA: Diagnosis not present

## 2015-10-06 DIAGNOSIS — J449 Chronic obstructive pulmonary disease, unspecified: Secondary | ICD-10-CM | POA: Diagnosis not present

## 2015-10-06 DIAGNOSIS — M19012 Primary osteoarthritis, left shoulder: Secondary | ICD-10-CM | POA: Diagnosis not present

## 2015-10-06 DIAGNOSIS — M19011 Primary osteoarthritis, right shoulder: Secondary | ICD-10-CM | POA: Diagnosis not present

## 2015-10-06 DIAGNOSIS — I110 Hypertensive heart disease with heart failure: Secondary | ICD-10-CM | POA: Diagnosis not present

## 2015-10-06 DIAGNOSIS — F419 Anxiety disorder, unspecified: Secondary | ICD-10-CM | POA: Diagnosis not present

## 2015-10-06 DIAGNOSIS — M5137 Other intervertebral disc degeneration, lumbosacral region: Secondary | ICD-10-CM | POA: Diagnosis not present

## 2015-10-06 DIAGNOSIS — I69354 Hemiplegia and hemiparesis following cerebral infarction affecting left non-dominant side: Secondary | ICD-10-CM | POA: Diagnosis not present

## 2015-10-06 DIAGNOSIS — M170 Bilateral primary osteoarthritis of knee: Secondary | ICD-10-CM | POA: Diagnosis not present

## 2015-10-06 DIAGNOSIS — I509 Heart failure, unspecified: Secondary | ICD-10-CM | POA: Diagnosis not present

## 2015-10-06 DIAGNOSIS — Z792 Long term (current) use of antibiotics: Secondary | ICD-10-CM | POA: Diagnosis not present

## 2015-10-06 DIAGNOSIS — G43909 Migraine, unspecified, not intractable, without status migrainosus: Secondary | ICD-10-CM | POA: Diagnosis not present

## 2015-10-06 DIAGNOSIS — F319 Bipolar disorder, unspecified: Secondary | ICD-10-CM | POA: Diagnosis not present

## 2015-10-06 DIAGNOSIS — Z7902 Long term (current) use of antithrombotics/antiplatelets: Secondary | ICD-10-CM | POA: Diagnosis not present

## 2015-10-14 DIAGNOSIS — F319 Bipolar disorder, unspecified: Secondary | ICD-10-CM | POA: Diagnosis not present

## 2015-10-14 DIAGNOSIS — M170 Bilateral primary osteoarthritis of knee: Secondary | ICD-10-CM | POA: Diagnosis not present

## 2015-10-14 DIAGNOSIS — Z792 Long term (current) use of antibiotics: Secondary | ICD-10-CM | POA: Diagnosis not present

## 2015-10-14 DIAGNOSIS — M19012 Primary osteoarthritis, left shoulder: Secondary | ICD-10-CM | POA: Diagnosis not present

## 2015-10-14 DIAGNOSIS — I110 Hypertensive heart disease with heart failure: Secondary | ICD-10-CM | POA: Diagnosis not present

## 2015-10-14 DIAGNOSIS — Z7902 Long term (current) use of antithrombotics/antiplatelets: Secondary | ICD-10-CM | POA: Diagnosis not present

## 2015-10-14 DIAGNOSIS — M19011 Primary osteoarthritis, right shoulder: Secondary | ICD-10-CM | POA: Diagnosis not present

## 2015-10-14 DIAGNOSIS — I509 Heart failure, unspecified: Secondary | ICD-10-CM | POA: Diagnosis not present

## 2015-10-14 DIAGNOSIS — J449 Chronic obstructive pulmonary disease, unspecified: Secondary | ICD-10-CM | POA: Diagnosis not present

## 2015-10-14 DIAGNOSIS — M5137 Other intervertebral disc degeneration, lumbosacral region: Secondary | ICD-10-CM | POA: Diagnosis not present

## 2015-10-14 DIAGNOSIS — F419 Anxiety disorder, unspecified: Secondary | ICD-10-CM | POA: Diagnosis not present

## 2015-10-14 DIAGNOSIS — G43909 Migraine, unspecified, not intractable, without status migrainosus: Secondary | ICD-10-CM | POA: Diagnosis not present

## 2015-10-14 DIAGNOSIS — I69354 Hemiplegia and hemiparesis following cerebral infarction affecting left non-dominant side: Secondary | ICD-10-CM | POA: Diagnosis not present

## 2015-10-28 DIAGNOSIS — J449 Chronic obstructive pulmonary disease, unspecified: Secondary | ICD-10-CM | POA: Diagnosis not present

## 2015-10-28 DIAGNOSIS — F419 Anxiety disorder, unspecified: Secondary | ICD-10-CM | POA: Diagnosis not present

## 2015-10-28 DIAGNOSIS — I69354 Hemiplegia and hemiparesis following cerebral infarction affecting left non-dominant side: Secondary | ICD-10-CM | POA: Diagnosis not present

## 2015-10-28 DIAGNOSIS — G43909 Migraine, unspecified, not intractable, without status migrainosus: Secondary | ICD-10-CM | POA: Diagnosis not present

## 2015-10-28 DIAGNOSIS — F319 Bipolar disorder, unspecified: Secondary | ICD-10-CM | POA: Diagnosis not present

## 2015-10-28 DIAGNOSIS — Z792 Long term (current) use of antibiotics: Secondary | ICD-10-CM | POA: Diagnosis not present

## 2015-10-28 DIAGNOSIS — M19011 Primary osteoarthritis, right shoulder: Secondary | ICD-10-CM | POA: Diagnosis not present

## 2015-10-28 DIAGNOSIS — I509 Heart failure, unspecified: Secondary | ICD-10-CM | POA: Diagnosis not present

## 2015-10-28 DIAGNOSIS — M170 Bilateral primary osteoarthritis of knee: Secondary | ICD-10-CM | POA: Diagnosis not present

## 2015-10-28 DIAGNOSIS — I110 Hypertensive heart disease with heart failure: Secondary | ICD-10-CM | POA: Diagnosis not present

## 2015-10-28 DIAGNOSIS — Z7902 Long term (current) use of antithrombotics/antiplatelets: Secondary | ICD-10-CM | POA: Diagnosis not present

## 2015-10-28 DIAGNOSIS — M19012 Primary osteoarthritis, left shoulder: Secondary | ICD-10-CM | POA: Diagnosis not present

## 2015-10-28 DIAGNOSIS — M5137 Other intervertebral disc degeneration, lumbosacral region: Secondary | ICD-10-CM | POA: Diagnosis not present

## 2015-11-02 DIAGNOSIS — Z6828 Body mass index (BMI) 28.0-28.9, adult: Secondary | ICD-10-CM | POA: Diagnosis not present

## 2015-11-02 DIAGNOSIS — M545 Low back pain: Secondary | ICD-10-CM | POA: Diagnosis not present

## 2015-11-02 DIAGNOSIS — R32 Unspecified urinary incontinence: Secondary | ICD-10-CM | POA: Diagnosis not present

## 2015-11-02 DIAGNOSIS — M5432 Sciatica, left side: Secondary | ICD-10-CM | POA: Diagnosis not present

## 2015-11-02 DIAGNOSIS — Z79891 Long term (current) use of opiate analgesic: Secondary | ICD-10-CM | POA: Diagnosis not present

## 2015-11-02 DIAGNOSIS — M5431 Sciatica, right side: Secondary | ICD-10-CM | POA: Diagnosis not present

## 2015-11-02 DIAGNOSIS — R300 Dysuria: Secondary | ICD-10-CM | POA: Diagnosis not present

## 2015-11-02 DIAGNOSIS — F11220 Opioid dependence with intoxication, uncomplicated: Secondary | ICD-10-CM | POA: Diagnosis not present

## 2015-11-02 DIAGNOSIS — R8271 Bacteriuria: Secondary | ICD-10-CM | POA: Diagnosis not present

## 2015-11-05 DIAGNOSIS — M19011 Primary osteoarthritis, right shoulder: Secondary | ICD-10-CM | POA: Diagnosis not present

## 2015-11-05 DIAGNOSIS — M170 Bilateral primary osteoarthritis of knee: Secondary | ICD-10-CM | POA: Diagnosis not present

## 2015-11-05 DIAGNOSIS — Z7902 Long term (current) use of antithrombotics/antiplatelets: Secondary | ICD-10-CM | POA: Diagnosis not present

## 2015-11-05 DIAGNOSIS — J449 Chronic obstructive pulmonary disease, unspecified: Secondary | ICD-10-CM | POA: Diagnosis not present

## 2015-11-05 DIAGNOSIS — G43909 Migraine, unspecified, not intractable, without status migrainosus: Secondary | ICD-10-CM | POA: Diagnosis not present

## 2015-11-05 DIAGNOSIS — M5137 Other intervertebral disc degeneration, lumbosacral region: Secondary | ICD-10-CM | POA: Diagnosis not present

## 2015-11-05 DIAGNOSIS — F419 Anxiety disorder, unspecified: Secondary | ICD-10-CM | POA: Diagnosis not present

## 2015-11-05 DIAGNOSIS — I509 Heart failure, unspecified: Secondary | ICD-10-CM | POA: Diagnosis not present

## 2015-11-05 DIAGNOSIS — I110 Hypertensive heart disease with heart failure: Secondary | ICD-10-CM | POA: Diagnosis not present

## 2015-11-05 DIAGNOSIS — I69354 Hemiplegia and hemiparesis following cerebral infarction affecting left non-dominant side: Secondary | ICD-10-CM | POA: Diagnosis not present

## 2015-11-05 DIAGNOSIS — M19012 Primary osteoarthritis, left shoulder: Secondary | ICD-10-CM | POA: Diagnosis not present

## 2015-11-05 DIAGNOSIS — Z792 Long term (current) use of antibiotics: Secondary | ICD-10-CM | POA: Diagnosis not present

## 2015-11-05 DIAGNOSIS — F319 Bipolar disorder, unspecified: Secondary | ICD-10-CM | POA: Diagnosis not present

## 2015-11-22 DIAGNOSIS — M5137 Other intervertebral disc degeneration, lumbosacral region: Secondary | ICD-10-CM | POA: Diagnosis not present

## 2015-11-23 DIAGNOSIS — M5137 Other intervertebral disc degeneration, lumbosacral region: Secondary | ICD-10-CM | POA: Diagnosis not present

## 2015-11-24 DIAGNOSIS — M5137 Other intervertebral disc degeneration, lumbosacral region: Secondary | ICD-10-CM | POA: Diagnosis not present

## 2015-11-25 DIAGNOSIS — M5137 Other intervertebral disc degeneration, lumbosacral region: Secondary | ICD-10-CM | POA: Diagnosis not present

## 2015-11-26 DIAGNOSIS — M5137 Other intervertebral disc degeneration, lumbosacral region: Secondary | ICD-10-CM | POA: Diagnosis not present

## 2015-11-27 DIAGNOSIS — R109 Unspecified abdominal pain: Secondary | ICD-10-CM | POA: Diagnosis not present

## 2015-11-27 DIAGNOSIS — R1011 Right upper quadrant pain: Secondary | ICD-10-CM | POA: Diagnosis not present

## 2015-11-27 DIAGNOSIS — K5903 Drug induced constipation: Secondary | ICD-10-CM | POA: Diagnosis not present

## 2015-11-27 DIAGNOSIS — R112 Nausea with vomiting, unspecified: Secondary | ICD-10-CM | POA: Diagnosis not present

## 2015-11-27 DIAGNOSIS — M5137 Other intervertebral disc degeneration, lumbosacral region: Secondary | ICD-10-CM | POA: Diagnosis not present

## 2015-11-27 DIAGNOSIS — R140 Abdominal distension (gaseous): Secondary | ICD-10-CM | POA: Diagnosis not present

## 2015-11-27 DIAGNOSIS — R1033 Periumbilical pain: Secondary | ICD-10-CM | POA: Diagnosis not present

## 2015-11-27 DIAGNOSIS — R1031 Right lower quadrant pain: Secondary | ICD-10-CM | POA: Diagnosis not present

## 2015-11-30 DIAGNOSIS — M5137 Other intervertebral disc degeneration, lumbosacral region: Secondary | ICD-10-CM | POA: Diagnosis not present

## 2015-12-01 DIAGNOSIS — M5137 Other intervertebral disc degeneration, lumbosacral region: Secondary | ICD-10-CM | POA: Diagnosis not present

## 2015-12-02 DIAGNOSIS — M5137 Other intervertebral disc degeneration, lumbosacral region: Secondary | ICD-10-CM | POA: Diagnosis not present

## 2015-12-03 DIAGNOSIS — I10 Essential (primary) hypertension: Secondary | ICD-10-CM | POA: Diagnosis not present

## 2015-12-03 DIAGNOSIS — Z79891 Long term (current) use of opiate analgesic: Secondary | ICD-10-CM | POA: Diagnosis not present

## 2015-12-03 DIAGNOSIS — G894 Chronic pain syndrome: Secondary | ICD-10-CM | POA: Diagnosis not present

## 2015-12-03 DIAGNOSIS — F11220 Opioid dependence with intoxication, uncomplicated: Secondary | ICD-10-CM | POA: Diagnosis not present

## 2015-12-03 DIAGNOSIS — M5137 Other intervertebral disc degeneration, lumbosacral region: Secondary | ICD-10-CM | POA: Diagnosis not present

## 2015-12-04 DIAGNOSIS — M5137 Other intervertebral disc degeneration, lumbosacral region: Secondary | ICD-10-CM | POA: Diagnosis not present

## 2015-12-05 DIAGNOSIS — M5137 Other intervertebral disc degeneration, lumbosacral region: Secondary | ICD-10-CM | POA: Diagnosis not present

## 2015-12-06 DIAGNOSIS — M5137 Other intervertebral disc degeneration, lumbosacral region: Secondary | ICD-10-CM | POA: Diagnosis not present

## 2015-12-07 DIAGNOSIS — M5137 Other intervertebral disc degeneration, lumbosacral region: Secondary | ICD-10-CM | POA: Diagnosis not present

## 2015-12-08 DIAGNOSIS — M5137 Other intervertebral disc degeneration, lumbosacral region: Secondary | ICD-10-CM | POA: Diagnosis not present

## 2015-12-09 DIAGNOSIS — M5137 Other intervertebral disc degeneration, lumbosacral region: Secondary | ICD-10-CM | POA: Diagnosis not present

## 2015-12-10 DIAGNOSIS — M5137 Other intervertebral disc degeneration, lumbosacral region: Secondary | ICD-10-CM | POA: Diagnosis not present

## 2015-12-11 DIAGNOSIS — M5137 Other intervertebral disc degeneration, lumbosacral region: Secondary | ICD-10-CM | POA: Diagnosis not present

## 2015-12-12 DIAGNOSIS — M5137 Other intervertebral disc degeneration, lumbosacral region: Secondary | ICD-10-CM | POA: Diagnosis not present

## 2015-12-13 DIAGNOSIS — R509 Fever, unspecified: Secondary | ICD-10-CM | POA: Diagnosis not present

## 2015-12-13 DIAGNOSIS — M5137 Other intervertebral disc degeneration, lumbosacral region: Secondary | ICD-10-CM | POA: Diagnosis not present

## 2015-12-13 DIAGNOSIS — R05 Cough: Secondary | ICD-10-CM | POA: Diagnosis not present

## 2015-12-13 DIAGNOSIS — J209 Acute bronchitis, unspecified: Secondary | ICD-10-CM | POA: Diagnosis not present

## 2015-12-14 DIAGNOSIS — M5137 Other intervertebral disc degeneration, lumbosacral region: Secondary | ICD-10-CM | POA: Diagnosis not present

## 2015-12-15 DIAGNOSIS — M5137 Other intervertebral disc degeneration, lumbosacral region: Secondary | ICD-10-CM | POA: Diagnosis not present

## 2015-12-16 DIAGNOSIS — M5137 Other intervertebral disc degeneration, lumbosacral region: Secondary | ICD-10-CM | POA: Diagnosis not present

## 2015-12-17 DIAGNOSIS — M5137 Other intervertebral disc degeneration, lumbosacral region: Secondary | ICD-10-CM | POA: Diagnosis not present

## 2015-12-18 DIAGNOSIS — M5137 Other intervertebral disc degeneration, lumbosacral region: Secondary | ICD-10-CM | POA: Diagnosis not present

## 2015-12-19 DIAGNOSIS — M5137 Other intervertebral disc degeneration, lumbosacral region: Secondary | ICD-10-CM | POA: Diagnosis not present

## 2015-12-20 DIAGNOSIS — M5137 Other intervertebral disc degeneration, lumbosacral region: Secondary | ICD-10-CM | POA: Diagnosis not present

## 2015-12-21 DIAGNOSIS — M5137 Other intervertebral disc degeneration, lumbosacral region: Secondary | ICD-10-CM | POA: Diagnosis not present

## 2015-12-22 DIAGNOSIS — M5137 Other intervertebral disc degeneration, lumbosacral region: Secondary | ICD-10-CM | POA: Diagnosis not present

## 2015-12-23 DIAGNOSIS — M5137 Other intervertebral disc degeneration, lumbosacral region: Secondary | ICD-10-CM | POA: Diagnosis not present

## 2015-12-24 DIAGNOSIS — M5137 Other intervertebral disc degeneration, lumbosacral region: Secondary | ICD-10-CM | POA: Diagnosis not present

## 2015-12-25 DIAGNOSIS — M5137 Other intervertebral disc degeneration, lumbosacral region: Secondary | ICD-10-CM | POA: Diagnosis not present

## 2015-12-26 DIAGNOSIS — M5137 Other intervertebral disc degeneration, lumbosacral region: Secondary | ICD-10-CM | POA: Diagnosis not present

## 2015-12-27 DIAGNOSIS — M5137 Other intervertebral disc degeneration, lumbosacral region: Secondary | ICD-10-CM | POA: Diagnosis not present

## 2015-12-28 DIAGNOSIS — M5137 Other intervertebral disc degeneration, lumbosacral region: Secondary | ICD-10-CM | POA: Diagnosis not present

## 2015-12-29 DIAGNOSIS — M5137 Other intervertebral disc degeneration, lumbosacral region: Secondary | ICD-10-CM | POA: Diagnosis not present

## 2015-12-30 DIAGNOSIS — M5137 Other intervertebral disc degeneration, lumbosacral region: Secondary | ICD-10-CM | POA: Diagnosis not present

## 2015-12-30 DIAGNOSIS — Z79891 Long term (current) use of opiate analgesic: Secondary | ICD-10-CM | POA: Diagnosis not present

## 2015-12-30 DIAGNOSIS — F11220 Opioid dependence with intoxication, uncomplicated: Secondary | ICD-10-CM | POA: Diagnosis not present

## 2015-12-30 DIAGNOSIS — I10 Essential (primary) hypertension: Secondary | ICD-10-CM | POA: Diagnosis not present

## 2015-12-30 DIAGNOSIS — G8929 Other chronic pain: Secondary | ICD-10-CM | POA: Diagnosis not present

## 2015-12-30 DIAGNOSIS — Z95828 Presence of other vascular implants and grafts: Secondary | ICD-10-CM | POA: Diagnosis not present

## 2015-12-31 DIAGNOSIS — M5137 Other intervertebral disc degeneration, lumbosacral region: Secondary | ICD-10-CM | POA: Diagnosis not present

## 2016-01-01 DIAGNOSIS — M5137 Other intervertebral disc degeneration, lumbosacral region: Secondary | ICD-10-CM | POA: Diagnosis not present

## 2016-01-02 DIAGNOSIS — M5137 Other intervertebral disc degeneration, lumbosacral region: Secondary | ICD-10-CM | POA: Diagnosis not present

## 2016-01-03 DIAGNOSIS — M5137 Other intervertebral disc degeneration, lumbosacral region: Secondary | ICD-10-CM | POA: Diagnosis not present

## 2016-01-04 DIAGNOSIS — M5137 Other intervertebral disc degeneration, lumbosacral region: Secondary | ICD-10-CM | POA: Diagnosis not present

## 2016-01-04 DIAGNOSIS — R32 Unspecified urinary incontinence: Secondary | ICD-10-CM | POA: Diagnosis not present

## 2016-01-05 DIAGNOSIS — M5137 Other intervertebral disc degeneration, lumbosacral region: Secondary | ICD-10-CM | POA: Diagnosis not present

## 2016-01-06 DIAGNOSIS — G9009 Other idiopathic peripheral autonomic neuropathy: Secondary | ICD-10-CM | POA: Diagnosis not present

## 2016-01-06 DIAGNOSIS — G629 Polyneuropathy, unspecified: Secondary | ICD-10-CM | POA: Diagnosis not present

## 2016-01-06 DIAGNOSIS — I10 Essential (primary) hypertension: Secondary | ICD-10-CM | POA: Diagnosis not present

## 2016-01-06 DIAGNOSIS — J449 Chronic obstructive pulmonary disease, unspecified: Secondary | ICD-10-CM | POA: Diagnosis not present

## 2016-01-06 DIAGNOSIS — M1990 Unspecified osteoarthritis, unspecified site: Secondary | ICD-10-CM | POA: Diagnosis not present

## 2016-01-06 DIAGNOSIS — M5137 Other intervertebral disc degeneration, lumbosacral region: Secondary | ICD-10-CM | POA: Diagnosis not present

## 2016-01-06 DIAGNOSIS — S91104A Unspecified open wound of right lesser toe(s) without damage to nail, initial encounter: Secondary | ICD-10-CM | POA: Diagnosis not present

## 2016-01-06 DIAGNOSIS — I110 Hypertensive heart disease with heart failure: Secondary | ICD-10-CM | POA: Diagnosis not present

## 2016-01-06 DIAGNOSIS — G4730 Sleep apnea, unspecified: Secondary | ICD-10-CM | POA: Diagnosis not present

## 2016-01-06 DIAGNOSIS — S91105A Unspecified open wound of left lesser toe(s) without damage to nail, initial encounter: Secondary | ICD-10-CM | POA: Diagnosis not present

## 2016-01-06 DIAGNOSIS — I509 Heart failure, unspecified: Secondary | ICD-10-CM | POA: Diagnosis not present

## 2016-01-06 DIAGNOSIS — Z87891 Personal history of nicotine dependence: Secondary | ICD-10-CM | POA: Diagnosis not present

## 2016-01-07 DIAGNOSIS — M1990 Unspecified osteoarthritis, unspecified site: Secondary | ICD-10-CM | POA: Diagnosis not present

## 2016-01-07 DIAGNOSIS — I872 Venous insufficiency (chronic) (peripheral): Secondary | ICD-10-CM | POA: Diagnosis not present

## 2016-01-07 DIAGNOSIS — R569 Unspecified convulsions: Secondary | ICD-10-CM | POA: Diagnosis not present

## 2016-01-07 DIAGNOSIS — M5137 Other intervertebral disc degeneration, lumbosacral region: Secondary | ICD-10-CM | POA: Diagnosis not present

## 2016-01-07 DIAGNOSIS — I10 Essential (primary) hypertension: Secondary | ICD-10-CM | POA: Diagnosis not present

## 2016-01-07 DIAGNOSIS — J45909 Unspecified asthma, uncomplicated: Secondary | ICD-10-CM | POA: Diagnosis not present

## 2016-01-07 DIAGNOSIS — D509 Iron deficiency anemia, unspecified: Secondary | ICD-10-CM | POA: Diagnosis not present

## 2016-01-08 DIAGNOSIS — M5137 Other intervertebral disc degeneration, lumbosacral region: Secondary | ICD-10-CM | POA: Diagnosis not present

## 2016-01-09 DIAGNOSIS — M5137 Other intervertebral disc degeneration, lumbosacral region: Secondary | ICD-10-CM | POA: Diagnosis not present

## 2016-01-10 DIAGNOSIS — M5137 Other intervertebral disc degeneration, lumbosacral region: Secondary | ICD-10-CM | POA: Diagnosis not present

## 2016-01-11 DIAGNOSIS — M5137 Other intervertebral disc degeneration, lumbosacral region: Secondary | ICD-10-CM | POA: Diagnosis not present

## 2016-01-12 DIAGNOSIS — M5137 Other intervertebral disc degeneration, lumbosacral region: Secondary | ICD-10-CM | POA: Diagnosis not present

## 2016-01-13 DIAGNOSIS — M5137 Other intervertebral disc degeneration, lumbosacral region: Secondary | ICD-10-CM | POA: Diagnosis not present

## 2016-01-13 DIAGNOSIS — S91104D Unspecified open wound of right lesser toe(s) without damage to nail, subsequent encounter: Secondary | ICD-10-CM | POA: Diagnosis not present

## 2016-01-13 DIAGNOSIS — S91104A Unspecified open wound of right lesser toe(s) without damage to nail, initial encounter: Secondary | ICD-10-CM | POA: Diagnosis not present

## 2016-01-14 DIAGNOSIS — H2513 Age-related nuclear cataract, bilateral: Secondary | ICD-10-CM | POA: Diagnosis not present

## 2016-01-14 DIAGNOSIS — M5137 Other intervertebral disc degeneration, lumbosacral region: Secondary | ICD-10-CM | POA: Diagnosis not present

## 2016-01-14 DIAGNOSIS — H547 Unspecified visual loss: Secondary | ICD-10-CM | POA: Diagnosis not present

## 2016-01-15 DIAGNOSIS — M5137 Other intervertebral disc degeneration, lumbosacral region: Secondary | ICD-10-CM | POA: Diagnosis not present

## 2016-01-16 DIAGNOSIS — M5137 Other intervertebral disc degeneration, lumbosacral region: Secondary | ICD-10-CM | POA: Diagnosis not present

## 2016-01-17 DIAGNOSIS — M5137 Other intervertebral disc degeneration, lumbosacral region: Secondary | ICD-10-CM | POA: Diagnosis not present

## 2016-01-18 DIAGNOSIS — M5137 Other intervertebral disc degeneration, lumbosacral region: Secondary | ICD-10-CM | POA: Diagnosis not present

## 2016-01-19 DIAGNOSIS — M5137 Other intervertebral disc degeneration, lumbosacral region: Secondary | ICD-10-CM | POA: Diagnosis not present

## 2016-01-20 DIAGNOSIS — Z09 Encounter for follow-up examination after completed treatment for conditions other than malignant neoplasm: Secondary | ICD-10-CM | POA: Diagnosis not present

## 2016-01-20 DIAGNOSIS — S91104D Unspecified open wound of right lesser toe(s) without damage to nail, subsequent encounter: Secondary | ICD-10-CM | POA: Diagnosis not present

## 2016-01-20 DIAGNOSIS — Z872 Personal history of diseases of the skin and subcutaneous tissue: Secondary | ICD-10-CM | POA: Diagnosis not present

## 2016-01-27 DIAGNOSIS — F11220 Opioid dependence with intoxication, uncomplicated: Secondary | ICD-10-CM | POA: Diagnosis not present

## 2016-01-27 DIAGNOSIS — Z1231 Encounter for screening mammogram for malignant neoplasm of breast: Secondary | ICD-10-CM | POA: Diagnosis not present

## 2016-01-27 DIAGNOSIS — Z6830 Body mass index (BMI) 30.0-30.9, adult: Secondary | ICD-10-CM | POA: Diagnosis not present

## 2016-01-27 DIAGNOSIS — L98491 Non-pressure chronic ulcer of skin of other sites limited to breakdown of skin: Secondary | ICD-10-CM | POA: Diagnosis not present

## 2016-01-27 DIAGNOSIS — Z23 Encounter for immunization: Secondary | ICD-10-CM | POA: Diagnosis not present

## 2016-01-27 DIAGNOSIS — E1165 Type 2 diabetes mellitus with hyperglycemia: Secondary | ICD-10-CM | POA: Diagnosis not present

## 2016-01-27 DIAGNOSIS — Z1210 Encounter for screening for malignant neoplasm of intestinal tract, unspecified: Secondary | ICD-10-CM | POA: Diagnosis not present

## 2016-01-27 DIAGNOSIS — Z79891 Long term (current) use of opiate analgesic: Secondary | ICD-10-CM | POA: Diagnosis not present

## 2016-01-27 DIAGNOSIS — I10 Essential (primary) hypertension: Secondary | ICD-10-CM | POA: Diagnosis not present

## 2016-01-27 DIAGNOSIS — G8929 Other chronic pain: Secondary | ICD-10-CM | POA: Diagnosis not present

## 2016-01-27 DIAGNOSIS — E782 Mixed hyperlipidemia: Secondary | ICD-10-CM | POA: Diagnosis not present

## 2016-02-03 DIAGNOSIS — D485 Neoplasm of uncertain behavior of skin: Secondary | ICD-10-CM | POA: Diagnosis not present

## 2016-02-08 DIAGNOSIS — Z452 Encounter for adjustment and management of vascular access device: Secondary | ICD-10-CM | POA: Diagnosis not present

## 2016-02-11 DIAGNOSIS — R32 Unspecified urinary incontinence: Secondary | ICD-10-CM | POA: Diagnosis not present

## 2016-02-13 DIAGNOSIS — I69311 Memory deficit following cerebral infarction: Secondary | ICD-10-CM | POA: Diagnosis not present

## 2016-02-13 DIAGNOSIS — M810 Age-related osteoporosis without current pathological fracture: Secondary | ICD-10-CM | POA: Diagnosis not present

## 2016-02-13 DIAGNOSIS — R197 Diarrhea, unspecified: Secondary | ICD-10-CM | POA: Diagnosis not present

## 2016-02-13 DIAGNOSIS — R1110 Vomiting, unspecified: Secondary | ICD-10-CM | POA: Diagnosis not present

## 2016-02-13 DIAGNOSIS — G4730 Sleep apnea, unspecified: Secondary | ICD-10-CM | POA: Diagnosis not present

## 2016-02-13 DIAGNOSIS — I69328 Other speech and language deficits following cerebral infarction: Secondary | ICD-10-CM | POA: Diagnosis not present

## 2016-02-13 DIAGNOSIS — E785 Hyperlipidemia, unspecified: Secondary | ICD-10-CM | POA: Diagnosis not present

## 2016-02-13 DIAGNOSIS — J449 Chronic obstructive pulmonary disease, unspecified: Secondary | ICD-10-CM | POA: Diagnosis not present

## 2016-02-13 DIAGNOSIS — K660 Peritoneal adhesions (postprocedural) (postinfection): Secondary | ICD-10-CM | POA: Diagnosis not present

## 2016-02-13 DIAGNOSIS — Z886 Allergy status to analgesic agent status: Secondary | ICD-10-CM | POA: Diagnosis not present

## 2016-02-13 DIAGNOSIS — R140 Abdominal distension (gaseous): Secondary | ICD-10-CM | POA: Diagnosis not present

## 2016-02-13 DIAGNOSIS — R1900 Intra-abdominal and pelvic swelling, mass and lump, unspecified site: Secondary | ICD-10-CM | POA: Diagnosis not present

## 2016-02-13 DIAGNOSIS — Z87891 Personal history of nicotine dependence: Secondary | ICD-10-CM | POA: Diagnosis not present

## 2016-02-13 DIAGNOSIS — F319 Bipolar disorder, unspecified: Secondary | ICD-10-CM | POA: Diagnosis not present

## 2016-02-13 DIAGNOSIS — K5669 Other intestinal obstruction: Secondary | ICD-10-CM | POA: Diagnosis not present

## 2016-02-13 DIAGNOSIS — K5660 Unspecified intestinal obstruction: Secondary | ICD-10-CM | POA: Diagnosis not present

## 2016-02-13 DIAGNOSIS — Z885 Allergy status to narcotic agent status: Secondary | ICD-10-CM | POA: Diagnosis not present

## 2016-02-13 DIAGNOSIS — T40605A Adverse effect of unspecified narcotics, initial encounter: Secondary | ICD-10-CM | POA: Diagnosis not present

## 2016-02-13 DIAGNOSIS — I2510 Atherosclerotic heart disease of native coronary artery without angina pectoris: Secondary | ICD-10-CM | POA: Diagnosis not present

## 2016-02-13 DIAGNOSIS — D62 Acute posthemorrhagic anemia: Secondary | ICD-10-CM | POA: Diagnosis not present

## 2016-02-13 DIAGNOSIS — R935 Abnormal findings on diagnostic imaging of other abdominal regions, including retroperitoneum: Secondary | ICD-10-CM | POA: Diagnosis not present

## 2016-02-13 DIAGNOSIS — Q249 Congenital malformation of heart, unspecified: Secondary | ICD-10-CM | POA: Diagnosis not present

## 2016-02-13 DIAGNOSIS — K565 Intestinal adhesions [bands] with obstruction (postprocedural) (postinfection): Secondary | ICD-10-CM | POA: Diagnosis not present

## 2016-02-13 DIAGNOSIS — Z888 Allergy status to other drugs, medicaments and biological substances status: Secondary | ICD-10-CM | POA: Diagnosis not present

## 2016-02-13 DIAGNOSIS — M1990 Unspecified osteoarthritis, unspecified site: Secondary | ICD-10-CM | POA: Diagnosis not present

## 2016-02-13 DIAGNOSIS — R1033 Periumbilical pain: Secondary | ICD-10-CM | POA: Diagnosis not present

## 2016-02-13 DIAGNOSIS — I10 Essential (primary) hypertension: Secondary | ICD-10-CM | POA: Diagnosis not present

## 2016-02-13 DIAGNOSIS — K599 Functional intestinal disorder, unspecified: Secondary | ICD-10-CM | POA: Diagnosis not present

## 2016-02-13 DIAGNOSIS — Z5331 Laparoscopic surgical procedure converted to open procedure: Secondary | ICD-10-CM | POA: Diagnosis not present

## 2016-03-01 DIAGNOSIS — F11220 Opioid dependence with intoxication, uncomplicated: Secondary | ICD-10-CM | POA: Diagnosis not present

## 2016-03-01 DIAGNOSIS — Z79891 Long term (current) use of opiate analgesic: Secondary | ICD-10-CM | POA: Diagnosis not present

## 2016-03-01 DIAGNOSIS — G8929 Other chronic pain: Secondary | ICD-10-CM | POA: Diagnosis not present

## 2016-03-01 DIAGNOSIS — R1084 Generalized abdominal pain: Secondary | ICD-10-CM | POA: Diagnosis not present

## 2016-03-01 DIAGNOSIS — I10 Essential (primary) hypertension: Secondary | ICD-10-CM | POA: Diagnosis not present

## 2016-03-02 DIAGNOSIS — K567 Ileus, unspecified: Secondary | ICD-10-CM | POA: Diagnosis not present

## 2016-03-02 DIAGNOSIS — E860 Dehydration: Secondary | ICD-10-CM | POA: Diagnosis not present

## 2016-03-02 DIAGNOSIS — R112 Nausea with vomiting, unspecified: Secondary | ICD-10-CM | POA: Diagnosis not present

## 2016-03-02 DIAGNOSIS — K651 Peritoneal abscess: Secondary | ICD-10-CM | POA: Diagnosis not present

## 2016-03-03 DIAGNOSIS — K219 Gastro-esophageal reflux disease without esophagitis: Secondary | ICD-10-CM | POA: Diagnosis not present

## 2016-03-03 DIAGNOSIS — I2510 Atherosclerotic heart disease of native coronary artery without angina pectoris: Secondary | ICD-10-CM | POA: Diagnosis not present

## 2016-03-03 DIAGNOSIS — R569 Unspecified convulsions: Secondary | ICD-10-CM | POA: Diagnosis not present

## 2016-03-03 DIAGNOSIS — M069 Rheumatoid arthritis, unspecified: Secondary | ICD-10-CM | POA: Diagnosis not present

## 2016-03-03 DIAGNOSIS — I509 Heart failure, unspecified: Secondary | ICD-10-CM | POA: Diagnosis not present

## 2016-03-03 DIAGNOSIS — G894 Chronic pain syndrome: Secondary | ICD-10-CM | POA: Diagnosis not present

## 2016-03-03 DIAGNOSIS — I10 Essential (primary) hypertension: Secondary | ICD-10-CM | POA: Diagnosis not present

## 2016-03-03 DIAGNOSIS — I252 Old myocardial infarction: Secondary | ICD-10-CM | POA: Diagnosis not present

## 2016-03-03 DIAGNOSIS — J449 Chronic obstructive pulmonary disease, unspecified: Secondary | ICD-10-CM | POA: Diagnosis not present

## 2016-03-03 DIAGNOSIS — M1990 Unspecified osteoarthritis, unspecified site: Secondary | ICD-10-CM | POA: Diagnosis not present

## 2016-03-03 DIAGNOSIS — Z79899 Other long term (current) drug therapy: Secondary | ICD-10-CM | POA: Diagnosis not present

## 2016-03-03 DIAGNOSIS — E785 Hyperlipidemia, unspecified: Secondary | ICD-10-CM | POA: Diagnosis not present

## 2016-03-03 DIAGNOSIS — K5900 Constipation, unspecified: Secondary | ICD-10-CM | POA: Diagnosis not present

## 2016-03-03 DIAGNOSIS — J45909 Unspecified asthma, uncomplicated: Secondary | ICD-10-CM | POA: Diagnosis not present

## 2016-03-03 DIAGNOSIS — J9811 Atelectasis: Secondary | ICD-10-CM | POA: Diagnosis not present

## 2016-03-03 DIAGNOSIS — E039 Hypothyroidism, unspecified: Secondary | ICD-10-CM | POA: Diagnosis not present

## 2016-03-03 DIAGNOSIS — K567 Ileus, unspecified: Secondary | ICD-10-CM | POA: Diagnosis not present

## 2016-03-03 DIAGNOSIS — K5660 Unspecified intestinal obstruction: Secondary | ICD-10-CM | POA: Diagnosis not present

## 2016-03-03 DIAGNOSIS — I4891 Unspecified atrial fibrillation: Secondary | ICD-10-CM | POA: Diagnosis not present

## 2016-03-03 DIAGNOSIS — K760 Fatty (change of) liver, not elsewhere classified: Secondary | ICD-10-CM | POA: Diagnosis not present

## 2016-03-03 DIAGNOSIS — Z8673 Personal history of transient ischemic attack (TIA), and cerebral infarction without residual deficits: Secondary | ICD-10-CM | POA: Diagnosis not present

## 2016-03-03 DIAGNOSIS — K651 Peritoneal abscess: Secondary | ICD-10-CM | POA: Diagnosis not present

## 2016-03-03 DIAGNOSIS — E7800 Pure hypercholesterolemia, unspecified: Secondary | ICD-10-CM | POA: Diagnosis not present

## 2016-03-03 DIAGNOSIS — Z885 Allergy status to narcotic agent status: Secondary | ICD-10-CM | POA: Diagnosis not present

## 2016-03-03 DIAGNOSIS — R109 Unspecified abdominal pain: Secondary | ICD-10-CM | POA: Diagnosis not present

## 2016-03-03 DIAGNOSIS — N179 Acute kidney failure, unspecified: Secondary | ICD-10-CM | POA: Diagnosis not present

## 2016-03-03 DIAGNOSIS — E860 Dehydration: Secondary | ICD-10-CM | POA: Diagnosis not present

## 2016-03-03 DIAGNOSIS — R112 Nausea with vomiting, unspecified: Secondary | ICD-10-CM | POA: Diagnosis not present

## 2016-03-03 DIAGNOSIS — Z888 Allergy status to other drugs, medicaments and biological substances status: Secondary | ICD-10-CM | POA: Diagnosis not present

## 2016-03-22 DIAGNOSIS — K5904 Chronic idiopathic constipation: Secondary | ICD-10-CM | POA: Diagnosis not present

## 2016-03-22 DIAGNOSIS — K5669 Other intestinal obstruction: Secondary | ICD-10-CM | POA: Diagnosis not present

## 2016-03-30 DIAGNOSIS — Z7901 Long term (current) use of anticoagulants: Secondary | ICD-10-CM | POA: Diagnosis not present

## 2016-03-30 DIAGNOSIS — Z79891 Long term (current) use of opiate analgesic: Secondary | ICD-10-CM | POA: Diagnosis not present

## 2016-03-30 DIAGNOSIS — J3089 Other allergic rhinitis: Secondary | ICD-10-CM | POA: Diagnosis not present

## 2016-03-30 DIAGNOSIS — F11220 Opioid dependence with intoxication, uncomplicated: Secondary | ICD-10-CM | POA: Diagnosis not present

## 2016-03-30 DIAGNOSIS — I10 Essential (primary) hypertension: Secondary | ICD-10-CM | POA: Diagnosis not present

## 2016-03-30 DIAGNOSIS — G8929 Other chronic pain: Secondary | ICD-10-CM | POA: Diagnosis not present

## 2016-04-28 DIAGNOSIS — Z8719 Personal history of other diseases of the digestive system: Secondary | ICD-10-CM | POA: Diagnosis not present

## 2016-04-28 DIAGNOSIS — B1920 Unspecified viral hepatitis C without hepatic coma: Secondary | ICD-10-CM | POA: Diagnosis not present

## 2016-04-28 DIAGNOSIS — M545 Low back pain: Secondary | ICD-10-CM | POA: Diagnosis not present

## 2016-04-28 DIAGNOSIS — G894 Chronic pain syndrome: Secondary | ICD-10-CM | POA: Diagnosis not present

## 2016-04-28 DIAGNOSIS — M5117 Intervertebral disc disorders with radiculopathy, lumbosacral region: Secondary | ICD-10-CM | POA: Diagnosis not present

## 2016-05-11 DIAGNOSIS — R32 Unspecified urinary incontinence: Secondary | ICD-10-CM | POA: Diagnosis not present

## 2016-05-13 DIAGNOSIS — Z959 Presence of cardiac and vascular implant and graft, unspecified: Secondary | ICD-10-CM | POA: Diagnosis not present

## 2016-05-27 DIAGNOSIS — M5117 Intervertebral disc disorders with radiculopathy, lumbosacral region: Secondary | ICD-10-CM | POA: Diagnosis not present

## 2016-05-27 DIAGNOSIS — N3000 Acute cystitis without hematuria: Secondary | ICD-10-CM | POA: Diagnosis not present

## 2016-05-27 DIAGNOSIS — G894 Chronic pain syndrome: Secondary | ICD-10-CM | POA: Diagnosis not present

## 2016-05-27 DIAGNOSIS — Z8719 Personal history of other diseases of the digestive system: Secondary | ICD-10-CM | POA: Diagnosis not present

## 2016-05-27 DIAGNOSIS — M545 Low back pain: Secondary | ICD-10-CM | POA: Diagnosis not present

## 2016-06-16 DIAGNOSIS — M47817 Spondylosis without myelopathy or radiculopathy, lumbosacral region: Secondary | ICD-10-CM | POA: Diagnosis not present

## 2016-06-16 DIAGNOSIS — M25552 Pain in left hip: Secondary | ICD-10-CM | POA: Diagnosis not present

## 2016-06-16 DIAGNOSIS — Z959 Presence of cardiac and vascular implant and graft, unspecified: Secondary | ICD-10-CM | POA: Diagnosis not present

## 2016-06-24 DIAGNOSIS — Z8719 Personal history of other diseases of the digestive system: Secondary | ICD-10-CM | POA: Diagnosis not present

## 2016-06-24 DIAGNOSIS — M47817 Spondylosis without myelopathy or radiculopathy, lumbosacral region: Secondary | ICD-10-CM | POA: Diagnosis not present

## 2016-06-24 DIAGNOSIS — M5117 Intervertebral disc disorders with radiculopathy, lumbosacral region: Secondary | ICD-10-CM | POA: Diagnosis not present

## 2016-06-24 DIAGNOSIS — M4806 Spinal stenosis, lumbar region: Secondary | ICD-10-CM | POA: Diagnosis not present

## 2016-06-24 DIAGNOSIS — M545 Low back pain: Secondary | ICD-10-CM | POA: Diagnosis not present

## 2016-06-24 DIAGNOSIS — I69898 Other sequelae of other cerebrovascular disease: Secondary | ICD-10-CM | POA: Diagnosis not present

## 2016-06-24 DIAGNOSIS — G894 Chronic pain syndrome: Secondary | ICD-10-CM | POA: Diagnosis not present

## 2016-06-24 DIAGNOSIS — M25559 Pain in unspecified hip: Secondary | ICD-10-CM | POA: Diagnosis not present

## 2016-06-29 DIAGNOSIS — M5136 Other intervertebral disc degeneration, lumbar region: Secondary | ICD-10-CM | POA: Diagnosis not present

## 2016-07-13 DIAGNOSIS — Z959 Presence of cardiac and vascular implant and graft, unspecified: Secondary | ICD-10-CM | POA: Diagnosis not present

## 2016-07-22 DIAGNOSIS — M545 Low back pain: Secondary | ICD-10-CM | POA: Diagnosis not present

## 2016-07-22 DIAGNOSIS — M5117 Intervertebral disc disorders with radiculopathy, lumbosacral region: Secondary | ICD-10-CM | POA: Diagnosis not present

## 2016-07-22 DIAGNOSIS — I69898 Other sequelae of other cerebrovascular disease: Secondary | ICD-10-CM | POA: Diagnosis not present

## 2016-07-22 DIAGNOSIS — Z8719 Personal history of other diseases of the digestive system: Secondary | ICD-10-CM | POA: Diagnosis not present

## 2016-08-22 DIAGNOSIS — M5117 Intervertebral disc disorders with radiculopathy, lumbosacral region: Secondary | ICD-10-CM | POA: Diagnosis not present

## 2016-08-22 DIAGNOSIS — M545 Low back pain: Secondary | ICD-10-CM | POA: Diagnosis not present

## 2016-08-22 DIAGNOSIS — Z8719 Personal history of other diseases of the digestive system: Secondary | ICD-10-CM | POA: Diagnosis not present

## 2016-08-22 DIAGNOSIS — Z23 Encounter for immunization: Secondary | ICD-10-CM | POA: Diagnosis not present

## 2016-08-22 DIAGNOSIS — I69898 Other sequelae of other cerebrovascular disease: Secondary | ICD-10-CM | POA: Diagnosis not present

## 2016-09-08 DIAGNOSIS — Z452 Encounter for adjustment and management of vascular access device: Secondary | ICD-10-CM | POA: Diagnosis not present

## 2016-09-12 DIAGNOSIS — M25552 Pain in left hip: Secondary | ICD-10-CM | POA: Diagnosis not present

## 2016-09-12 DIAGNOSIS — S39012A Strain of muscle, fascia and tendon of lower back, initial encounter: Secondary | ICD-10-CM | POA: Diagnosis not present

## 2016-09-12 DIAGNOSIS — S3992XA Unspecified injury of lower back, initial encounter: Secondary | ICD-10-CM | POA: Diagnosis not present

## 2016-09-12 DIAGNOSIS — S73102A Unspecified sprain of left hip, initial encounter: Secondary | ICD-10-CM | POA: Diagnosis not present

## 2016-09-12 DIAGNOSIS — S79912A Unspecified injury of left hip, initial encounter: Secondary | ICD-10-CM | POA: Diagnosis not present

## 2016-09-21 DIAGNOSIS — M5117 Intervertebral disc disorders with radiculopathy, lumbosacral region: Secondary | ICD-10-CM | POA: Diagnosis not present

## 2016-09-21 DIAGNOSIS — Z8719 Personal history of other diseases of the digestive system: Secondary | ICD-10-CM | POA: Diagnosis not present

## 2016-09-21 DIAGNOSIS — M545 Low back pain: Secondary | ICD-10-CM | POA: Diagnosis not present

## 2016-09-21 DIAGNOSIS — I69898 Other sequelae of other cerebrovascular disease: Secondary | ICD-10-CM | POA: Diagnosis not present

## 2016-10-20 DIAGNOSIS — I69898 Other sequelae of other cerebrovascular disease: Secondary | ICD-10-CM | POA: Diagnosis not present

## 2016-10-20 DIAGNOSIS — M5117 Intervertebral disc disorders with radiculopathy, lumbosacral region: Secondary | ICD-10-CM | POA: Diagnosis not present

## 2016-10-20 DIAGNOSIS — Z8719 Personal history of other diseases of the digestive system: Secondary | ICD-10-CM | POA: Diagnosis not present

## 2016-10-20 DIAGNOSIS — M545 Low back pain: Secondary | ICD-10-CM | POA: Diagnosis not present

## 2016-10-22 DIAGNOSIS — I10 Essential (primary) hypertension: Secondary | ICD-10-CM | POA: Diagnosis not present

## 2016-10-22 DIAGNOSIS — E039 Hypothyroidism, unspecified: Secondary | ICD-10-CM | POA: Diagnosis not present

## 2016-10-22 DIAGNOSIS — K529 Noninfective gastroenteritis and colitis, unspecified: Secondary | ICD-10-CM | POA: Diagnosis not present

## 2016-10-22 DIAGNOSIS — K56600 Partial intestinal obstruction, unspecified as to cause: Secondary | ICD-10-CM

## 2016-10-23 DIAGNOSIS — K56600 Partial intestinal obstruction, unspecified as to cause: Secondary | ICD-10-CM | POA: Diagnosis not present

## 2016-10-24 DIAGNOSIS — I10 Essential (primary) hypertension: Secondary | ICD-10-CM

## 2016-10-24 DIAGNOSIS — K56600 Partial intestinal obstruction, unspecified as to cause: Secondary | ICD-10-CM | POA: Diagnosis not present

## 2016-10-24 DIAGNOSIS — E039 Hypothyroidism, unspecified: Secondary | ICD-10-CM

## 2016-10-24 DIAGNOSIS — K529 Noninfective gastroenteritis and colitis, unspecified: Secondary | ICD-10-CM

## 2016-10-25 DIAGNOSIS — K56600 Partial intestinal obstruction, unspecified as to cause: Secondary | ICD-10-CM | POA: Diagnosis not present

## 2016-10-25 DIAGNOSIS — I10 Essential (primary) hypertension: Secondary | ICD-10-CM | POA: Diagnosis not present

## 2016-10-25 DIAGNOSIS — K529 Noninfective gastroenteritis and colitis, unspecified: Secondary | ICD-10-CM | POA: Diagnosis not present

## 2016-10-25 DIAGNOSIS — E039 Hypothyroidism, unspecified: Secondary | ICD-10-CM | POA: Diagnosis not present

## 2017-03-31 DIAGNOSIS — M797 Fibromyalgia: Secondary | ICD-10-CM | POA: Diagnosis not present

## 2017-03-31 DIAGNOSIS — Z959 Presence of cardiac and vascular implant and graft, unspecified: Secondary | ICD-10-CM | POA: Diagnosis not present

## 2017-03-31 DIAGNOSIS — Z452 Encounter for adjustment and management of vascular access device: Secondary | ICD-10-CM | POA: Diagnosis not present

## 2017-03-31 DIAGNOSIS — E569 Vitamin deficiency, unspecified: Secondary | ICD-10-CM | POA: Diagnosis not present

## 2017-04-06 ENCOUNTER — Emergency Department (HOSPITAL_COMMUNITY): Payer: Medicare Other

## 2017-04-06 ENCOUNTER — Observation Stay (HOSPITAL_COMMUNITY)
Admission: EM | Admit: 2017-04-06 | Discharge: 2017-04-08 | Disposition: A | Discharge status: Home / Self Care | Payer: Medicare Other | Attending: Internal Medicine | Admitting: Internal Medicine

## 2017-04-06 ENCOUNTER — Encounter (HOSPITAL_COMMUNITY): Payer: Self-pay

## 2017-04-06 DIAGNOSIS — Z8673 Personal history of transient ischemic attack (TIA), and cerebral infarction without residual deficits: Secondary | ICD-10-CM | POA: Insufficient documentation

## 2017-04-06 DIAGNOSIS — I110 Hypertensive heart disease with heart failure: Secondary | ICD-10-CM | POA: Diagnosis not present

## 2017-04-06 DIAGNOSIS — E876 Hypokalemia: Secondary | ICD-10-CM | POA: Insufficient documentation

## 2017-04-06 DIAGNOSIS — Z7982 Long term (current) use of aspirin: Secondary | ICD-10-CM | POA: Diagnosis not present

## 2017-04-06 DIAGNOSIS — I10 Essential (primary) hypertension: Secondary | ICD-10-CM | POA: Diagnosis present

## 2017-04-06 DIAGNOSIS — R531 Weakness: Secondary | ICD-10-CM | POA: Diagnosis not present

## 2017-04-06 DIAGNOSIS — R0789 Other chest pain: Principal | ICD-10-CM | POA: Insufficient documentation

## 2017-04-06 DIAGNOSIS — I509 Heart failure, unspecified: Secondary | ICD-10-CM | POA: Diagnosis not present

## 2017-04-06 DIAGNOSIS — F17210 Nicotine dependence, cigarettes, uncomplicated: Secondary | ICD-10-CM | POA: Diagnosis not present

## 2017-04-06 DIAGNOSIS — I959 Hypotension, unspecified: Secondary | ICD-10-CM | POA: Insufficient documentation

## 2017-04-06 DIAGNOSIS — R079 Chest pain, unspecified: Secondary | ICD-10-CM | POA: Diagnosis present

## 2017-04-06 DIAGNOSIS — F329 Major depressive disorder, single episode, unspecified: Secondary | ICD-10-CM | POA: Insufficient documentation

## 2017-04-06 DIAGNOSIS — G894 Chronic pain syndrome: Secondary | ICD-10-CM | POA: Diagnosis not present

## 2017-04-06 DIAGNOSIS — F32A Depression, unspecified: Secondary | ICD-10-CM | POA: Diagnosis present

## 2017-04-06 DIAGNOSIS — Z7902 Long term (current) use of antithrombotics/antiplatelets: Secondary | ICD-10-CM | POA: Insufficient documentation

## 2017-04-06 DIAGNOSIS — M6281 Muscle weakness (generalized): Secondary | ICD-10-CM | POA: Diagnosis not present

## 2017-04-06 HISTORY — DX: Heart failure, unspecified: I50.9

## 2017-04-06 HISTORY — DX: Unspecified viral hepatitis C without hepatic coma: B19.20

## 2017-04-06 HISTORY — DX: Unspecified osteoarthritis, unspecified site: M19.90

## 2017-04-06 HISTORY — DX: Cerebral infarction, unspecified: I63.9

## 2017-04-06 HISTORY — DX: Essential (primary) hypertension: I10

## 2017-04-06 HISTORY — DX: Chronic pain syndrome: G89.4

## 2017-04-06 LAB — RAPID URINE DRUG SCREEN, HOSP PERFORMED
AMPHETAMINES: NOT DETECTED
BENZODIAZEPINES: POSITIVE — AB
Barbiturates: NOT DETECTED
Cocaine: NOT DETECTED
OPIATES: NOT DETECTED
TETRAHYDROCANNABINOL: NOT DETECTED

## 2017-04-06 LAB — BASIC METABOLIC PANEL
ANION GAP: 8 (ref 5–15)
BUN: 6 mg/dL (ref 6–20)
CO2: 26 mmol/L (ref 22–32)
Calcium: 8.6 mg/dL — ABNORMAL LOW (ref 8.9–10.3)
Chloride: 106 mmol/L (ref 101–111)
Creatinine, Ser: 0.6 mg/dL (ref 0.44–1.00)
GFR calc Af Amer: >60 mL/min (ref >60)
GFR calc non Af Amer: >60 mL/min (ref >60)
GLUCOSE: 91 mg/dL (ref 65–99)
POTASSIUM: 3.3 mmol/L — AB (ref 3.5–5.1)
Sodium: 140 mmol/L (ref 135–145)

## 2017-04-06 LAB — CBC WITH DIFFERENTIAL/PLATELET
Basophils Absolute: 0 10*3/uL (ref 0.0–0.1)
Basophils Relative: 0 %
Eosinophils Absolute: 0.1 10*3/uL (ref 0.0–0.7)
Eosinophils Relative: 1 %
HEMATOCRIT: 35.9 % — AB (ref 36.0–46.0)
HEMOGLOBIN: 12 g/dL (ref 12.0–15.0)
LYMPHS ABS: 2.4 10*3/uL (ref 0.7–4.0)
Lymphocytes Relative: 62 %
MCH: 24.1 pg — AB (ref 26.0–34.0)
MCHC: 33.4 g/dL (ref 30.0–36.0)
MCV: 72.1 fL — AB (ref 78.0–100.0)
MONO ABS: 0.3 10*3/uL (ref 0.1–1.0)
MONOS PCT: 9 %
NEUTROS ABS: 1.1 10*3/uL — AB (ref 1.7–7.7)
NEUTROS PCT: 28 %
Platelets: 189 10*3/uL (ref 150–400)
RBC: 4.98 MIL/uL (ref 3.87–5.11)
RDW: 15.6 % — AB (ref 11.5–15.5)
WBC: 3.9 10*3/uL — ABNORMAL LOW (ref 4.0–10.5)

## 2017-04-06 LAB — D-DIMER, QUANTITATIVE: D-Dimer, Quant: 0.84 ug/mL-FEU — ABNORMAL HIGH (ref 0.00–0.50)

## 2017-04-06 LAB — TROPONIN I
Troponin I: <0.03 ng/mL (ref <0.03)
Troponin I: <0.03 ng/mL (ref <0.03)

## 2017-04-06 LAB — I-STAT CREATININE, ED: Creatinine, Ser: 0.6 mg/dL (ref 0.44–1.00)

## 2017-04-06 MED ORDER — BUSPIRONE HCL 10 MG PO TABS
30.0000 mg | ORAL_TABLET | Freq: Three times a day (TID) | ORAL | Status: DC
  Administered 2017-04-06 – 2017-04-07 (×2): 30 mg via ORAL
  Filled 2017-04-06: qty 3
  Filled 2017-04-06: qty 6

## 2017-04-06 MED ORDER — FUROSEMIDE 40 MG PO TABS
40.0000 mg | ORAL_TABLET | Freq: Every day | ORAL | Status: DC
  Administered 2017-04-06 – 2017-04-07 (×2): 40 mg via ORAL
  Filled 2017-04-06 (×2): qty 1

## 2017-04-06 MED ORDER — CLOPIDOGREL BISULFATE 75 MG PO TABS
75.0000 mg | ORAL_TABLET | Freq: Every day | ORAL | Status: DC
  Administered 2017-04-07 – 2017-04-08 (×2): 75 mg via ORAL
  Filled 2017-04-06 (×2): qty 1

## 2017-04-06 MED ORDER — CARVEDILOL 25 MG PO TABS
25.0000 mg | ORAL_TABLET | Freq: Two times a day (BID) | ORAL | Status: DC
  Administered 2017-04-06: 25 mg via ORAL
  Filled 2017-04-06 (×2): qty 1

## 2017-04-06 MED ORDER — AMLODIPINE BESYLATE 10 MG PO TABS
10.0000 mg | ORAL_TABLET | Freq: Every day | ORAL | Status: DC
  Filled 2017-04-06: qty 1

## 2017-04-06 MED ORDER — FLUTICASONE PROPIONATE 50 MCG/ACT NA SUSP: 2.0000 | Freq: Every day | NASAL | Status: DC | PRN

## 2017-04-06 MED ORDER — SODIUM CHLORIDE 0.9% FLUSH: 10.0000 mL | INTRAVENOUS | Status: DC | PRN

## 2017-04-06 MED ORDER — LUBIPROSTONE 24 MCG PO CAPS
24.0000 ug | ORAL_CAPSULE | Freq: Two times a day (BID) | ORAL | Status: DC
Start: 2017-04-07 — End: 2017-04-08
  Administered 2017-04-07 – 2017-04-08 (×3): 24 ug via ORAL
  Filled 2017-04-06 (×3): qty 1

## 2017-04-06 MED ORDER — PREGABALIN 50 MG PO CAPS
150.0000 mg | ORAL_CAPSULE | Freq: Three times a day (TID) | ORAL | Status: DC
  Administered 2017-04-06 – 2017-04-07 (×2): 150 mg via ORAL
  Filled 2017-04-06 (×2): qty 3

## 2017-04-06 MED ORDER — IOPAMIDOL (ISOVUE-370) INJECTION 76%
INTRAVENOUS | Status: AC
  Administered 2017-04-06: 100 mL
  Filled 2017-04-06: qty 100

## 2017-04-06 MED ORDER — ALBUTEROL SULFATE 1.25 MG/3ML IN NEBU: 3.0000 mL | INHALATION_SOLUTION | Freq: Three times a day (TID) | RESPIRATORY_TRACT | Status: DC | PRN

## 2017-04-06 MED ORDER — NITROGLYCERIN 0.4 MG SL SUBL
0.4000 mg | SUBLINGUAL_TABLET | SUBLINGUAL | Status: DC | PRN
  Administered 2017-04-06 (×2): 0.4 mg via SUBLINGUAL
  Filled 2017-04-06: qty 1

## 2017-04-06 MED ORDER — ACETAMINOPHEN 325 MG PO TABS: 650.0000 mg | ORAL_TABLET | ORAL | Status: DC | PRN

## 2017-04-06 MED ORDER — ONDANSETRON HCL 4 MG/2ML IJ SOLN: 4.0000 mg | Freq: Four times a day (QID) | INTRAMUSCULAR | Status: DC | PRN

## 2017-04-06 MED ORDER — FENTANYL CITRATE (PF) 100 MCG/2ML IJ SOLN
50.0000 ug | Freq: Once | INTRAMUSCULAR | Status: AC
  Administered 2017-04-06: 50 ug via INTRAVENOUS
  Filled 2017-04-06: qty 2

## 2017-04-06 MED ORDER — OXYCODONE HCL 5 MG PO TABS
10.0000 mg | ORAL_TABLET | Freq: Four times a day (QID) | ORAL | Status: DC | PRN
  Administered 2017-04-06 – 2017-04-08 (×6): 10 mg via ORAL
  Filled 2017-04-06 (×6): qty 2

## 2017-04-06 MED ORDER — ENOXAPARIN SODIUM 40 MG/0.4ML ~~LOC~~ SOLN
40.0000 mg | SUBCUTANEOUS | Status: DC
  Administered 2017-04-06 – 2017-04-07 (×2): 40 mg via SUBCUTANEOUS
  Filled 2017-04-06 (×2): qty 0.4

## 2017-04-06 MED ORDER — FLUTICASONE FUROATE-VILANTEROL 200-25 MCG/INH IN AEPB
1.0000 | INHALATION_SPRAY | Freq: Every day | RESPIRATORY_TRACT | Status: DC
  Administered 2017-04-08: 09:00:00 1 via RESPIRATORY_TRACT
  Filled 2017-04-06 (×2): qty 28

## 2017-04-06 MED ORDER — ALBUTEROL SULFATE (2.5 MG/3ML) 0.083% IN NEBU: 2.5000 mg | INHALATION_SOLUTION | Freq: Four times a day (QID) | RESPIRATORY_TRACT | Status: DC | PRN

## 2017-04-06 MED ORDER — LOSARTAN POTASSIUM 50 MG PO TABS
50.0000 mg | ORAL_TABLET | Freq: Every day | ORAL | Status: DC
  Administered 2017-04-07: 50 mg via ORAL
  Filled 2017-04-06: qty 1

## 2017-04-06 MED ORDER — POTASSIUM CHLORIDE CRYS ER 20 MEQ PO TBCR
40.0000 meq | EXTENDED_RELEASE_TABLET | Freq: Once | ORAL | Status: AC
  Administered 2017-04-06: 40 meq via ORAL
  Filled 2017-04-06: qty 2

## 2017-04-06 MED ORDER — HYDROMORPHONE HCL 1 MG/ML IJ SOLN
1.0000 mg | Freq: Once | INTRAMUSCULAR | Status: AC
  Administered 2017-04-06: 1 mg via INTRAVENOUS
  Filled 2017-04-06: qty 1

## 2017-04-06 MED ORDER — ASPIRIN 81 MG PO CHEW
81.0000 mg | CHEWABLE_TABLET | Freq: Every day | ORAL | Status: DC
  Administered 2017-04-07 – 2017-04-08 (×2): 81 mg via ORAL
  Filled 2017-04-06 (×2): qty 1

## 2017-04-06 NOTE — ED Notes (Signed)
Pt refusing 3rd nitro.

## 2017-04-06 NOTE — H&P (Signed)
History and Physical    Caitlyn Pacheco LKG:[id number] DOB: 01-May-1956 DOA: 04/06/2017  PCP: System, Pcp Not In  Patient coming from: Home  I have personally briefly reviewed patient's old medical records in [place]  Chief Complaint: Chest pain  HPI: Caitlyn Pacheco is a 61 y.o. female with medical history significant of Stroke, chronic pain syndrome, HCV, HTN.  Patient presents to the ED with c/o chest pain.  Pain located in middle of chest, sharp in quality, onset about 3 days ago, worsening since onset.  Wraps around to her back.  Nothing makes better or worse.   ED Course: Initially refused NTG but then got it in ED.  Got 1mg  dilaudid IV in ED.  Trop neg, EKG not really remarkable.   Review of Systems: As per HPI otherwise 10 point review of systems negative.   Past Medical History:  Diagnosis Date  . CHF (congestive heart failure) ([place])   . Chronic pain syndrome   . HCV (hepatitis C virus)   . Hypertension   . Osteoarthritis   . Stroke [facility])     Past Surgical History:  Procedure Laterality Date  . ABDOMINAL HYSTERECTOMY    . ABDOMINAL SURGERY    . CHOLECYSTECTOMY    . THYROID SURGERY       reports that she has been smoking Cigarettes.  She has been smoking about 0.50 packs per day. She has never used smokeless tobacco. She reports that she does not drink alcohol or use drugs.  Allergies  Allergen Reactions  . Corn-Containing Products     Cannot digest this  . Ibuprofen     Per her doctor  . Morphine And Related Other (See Comments)    Per her doctor  . Nsaids     Per her doctor  . Prednisone Other (See Comments)    Per her doctor  . Statins Other (See Comments)    Affected liver enzymes    No family history on file.   Prior to Admission medications   Medication Sig Start Date End Date Taking? Authorizing Provider  albuterol (ACCUNEB) 1.25 MG/3ML nebulizer solution Take 3 mLs by nebulization 3 (three) times daily as needed for wheezing or  shortness of breath.  10/31/11  Yes [provider]  albuterol (PROAIR HFA) 108 (90 Base) MCG/ACT inhaler Inhale 2 puffs into the lungs every 6 (six) hours as needed for wheezing or shortness of breath.   Yes [provider]  amLODipine (NORVASC) 10 MG tablet Take 10 mg by mouth daily. 03/20/17  Yes [provider]  aspirin 81 MG chewable tablet Chew 81 mg by mouth daily. 08/13/12  Yes [provider]  budesonide-formoterol (SYMBICORT) 160-4.5 MCG/ACT inhaler Inhale 2 puffs into the lungs 2 (two) times daily.   Yes [provider]  busPIRone (BUSPAR) 30 MG tablet Take 30 mg by mouth 3 (three) times daily. 03/13/17  Yes [provider]  carvedilol (COREG) 25 MG tablet Take 25 mg by mouth 2 (two) times daily. 01/18/17  Yes [provider]  clopidogrel (PLAVIX) 75 MG tablet Take 75 mg by mouth daily.   Yes [provider]  fluticasone (FLONASE ALLERGY RELIEF) 50 MCG/ACT nasal spray Place 2 sprays into both nostrils daily as needed for allergies or rhinitis.   Yes [provider]  Fluticasone-Salmeterol (ADVAIR DISKUS) 250-50 MCG/DOSE AEPB Inhale 1 puff into the lungs 2 (two) times daily.   Yes [provider]  furosemide (LASIX) 40 MG tablet Take  40 mg by mouth daily.   Yes [provider]  losartan (COZAAR) 50 MG tablet Take 50 mg by mouth daily.   Yes [provider]  lubiprostone (AMITIZA) 24 MCG capsule Take 24 mcg by mouth 2 (two) times daily with a meal.   Yes [provider]  naloxone (NARCAN) nasal spray 4 mg/0.1 mL 1 spray See admin instructions. INTO 1 NOSTRIL AND MAY REPEAT EVERY 2-3 MINUTES UNTIL EMERGENCY ARRIVES 02/18/17  Yes [provider]  oxycodone (ROXICODONE) 30 MG immediate release tablet Take 30 mg by mouth 5 (five) times daily as needed for pain.    Yes [provider]  pregabalin (LYRICA) 150 MG capsule Take 150 mg by mouth 3 (three) times daily.   Yes  [provider]    Physical Exam: Vitals:   04/06/17 1747 04/06/17 1845 04/06/17 1850 04/06/17 1900  BP: 130/77 134/76 134/76 134/79  Pulse: 64 70 60 (!) 58  Resp: 14 (!) 21 16 20   Temp:      TempSrc:      SpO2: 96% 98% 98% 94%  Weight:      Height:        Constitutional: NAD, calm, comfortable Eyes: PERRL, lids and conjunctivae normal ENMT: Mucous membranes are moist. Posterior pharynx clear of any exudate or lesions.Normal dentition.  Neck: normal, supple, no masses, no thyromegaly Respiratory: clear to auscultation bilaterally, no wheezing, no crackles. Normal respiratory effort. No accessory muscle use.  Cardiovascular: Regular rate and rhythm, no murmurs / rubs / gallops. No extremity edema. 2+ pedal pulses. No carotid bruits.  Abdomen: no tenderness, no masses palpated. No hepatosplenomegaly. Bowel sounds positive.  Musculoskeletal: no clubbing / cyanosis. No joint deformity upper and lower extremities. Good ROM, no contractures. Normal muscle tone.  Skin: no rashes, lesions, ulcers. No induration Neurologic: CN 2-12 grossly intact. Sensation intact, DTR normal. Strength 5/5 in all 4.  Psychiatric: Normal judgment and insight. Alert and oriented x 3. Normal mood.    Labs on Admission: I have personally reviewed following labs and imaging studies  CBC:  Recent Labs Lab 04/06/17 1724  WBC 3.9*  NEUTROABS 1.1*  HGB 12.0  HCT 35.9*  MCV 72.1*  PLT 098   Basic Metabolic Panel:  Recent Labs Lab 04/06/17 1724 04/06/17 1737  NA 140  --   K 3.3*  --   CL 106  --   CO2 26  --   GLUCOSE 91  --   BUN 6  --   CREATININE 0.60 0.60  CALCIUM 8.6*  --    GFR: Estimated Creatinine Clearance: 88.3 mL/min (by C-G formula based on SCr of 0.6 mg/dL). Liver Function Tests: No results for input(s): AST, ALT, ALKPHOS, BILITOT, PROT, ALBUMIN in the last 168 hours. No results for input(s): LIPASE, AMYLASE in the last 168 hours. No results for input(s): AMMONIA in  the last 168 hours. Coagulation Profile: No results for input(s): INR, PROTIME in the last 168 hours. Cardiac Enzymes:  Recent Labs Lab 04/06/17 1724  TROPONINI <0.03   BNP (last 3 results) No results for input(s): PROBNP in the last 8760 hours. HbA1C: No results for input(s): HGBA1C in the last 72 hours. CBG: No results for input(s): GLUCAP in the last 168 hours. Lipid Profile: No results for input(s): CHOL, HDL, LDLCALC, TRIG, CHOLHDL, LDLDIRECT in the last 72 hours. Thyroid Function Tests: No results for input(s): TSH, T4TOTAL, FREET4, T3FREE, THYROIDAB in the last 72 hours. Anemia Panel: No results for input(s): VITAMINB12, FOLATE,  FERRITIN, TIBC, IRON, RETICCTPCT in the last 72 hours. Urine analysis: No results found for: COLORURINE, APPEARANCEUR, LABSPEC, PHURINE, GLUCOSEU, HGBUR, BILIRUBINUR, KETONESUR, PROTEINUR, UROBILINOGEN, NITRITE, LEUKOCYTESUR  Radiological Exams on Admission: Dg Chest 2 View  Result Date: 04/06/2017 CLINICAL DATA:  Chest and back pain for 2 days. EXAM: CHEST  2 VIEW COMPARISON:  Chest CTA 12/31/2016 and radiographs 11/14/2016 FINDINGS: Left jugular Port-A-Cath terminates over the SVC, unchanged. The cardiac silhouette is upper limits of normal in size. The lungs are clear. No pleural effusion or pneumothorax is identified. No acute osseous abnormality is seen. IMPRESSION: No active cardiopulmonary disease. Electronically Signed   By: Logan Bores M.D.   On: 04/06/2017 17:09   Ct Head Wo Contrast  Result Date: 04/06/2017 CLINICAL DATA:  Left-sided weakness EXAM: CT HEAD WITHOUT CONTRAST TECHNIQUE: Contiguous axial images were obtained from the base of the skull through the vertex without intravenous contrast. COMPARISON:  11/14/2016 FINDINGS: Brain: No acute territorial infarction, hemorrhage or intracranial mass is seen. Old lacunar infarcts in the right basal ganglia. The ventricles are nonenlarged. Vascular: No hyperdense vessels.  Carotid artery  calcifications. Skull: Normal. Negative for fracture or focal lesion. Sinuses/Orbits: No acute finding. Other: None IMPRESSION: No CT evidence for acute intracranial abnormality. Electronically Signed   By: Donavan Foil M.D.   On: 04/06/2017 19:29   Ct Angio Chest/abd/pel For Dissection W And/or Wo Contrast  Result Date: 04/06/2017 CLINICAL DATA:  Left-sided chest pain and weakness. Clinical suspicion for aortic dissection. EXAM: CT ANGIOGRAPHY CHEST, ABDOMEN AND PELVIS TECHNIQUE: Multidetector CT imaging through the chest, abdomen and pelvis was performed using the standard protocol during bolus administration of intravenous contrast. Multiplanar reconstructed images and MIPs were obtained and reviewed to evaluate the vascular anatomy. CONTRAST:  100 mL Isovue 370 COMPARISON:  Chest CTA on 12/31/2016 and AP CT on 10/22/2016 FINDINGS: CTA CHEST FINDINGS Cardiovascular: No evidence of thoracic aortic dissection or aneurysm. No evidence of acute pulmonary embolism. Normal heart size. No evidence of pericardial effusion. Aortic and coronary artery atherosclerosis. Mediastinum/Nodes: No masses or pathologically enlarged lymph nodes identified. Previous right thyroidectomy. Lungs/Pleura: No pulmonary mass, infiltrate, or effusion. Stable 1-2 mm pulmonary nodule in anterior right lower lobe. Musculoskeletal: No suspicious bone lesions identified. Review of the MIP images confirms the above findings. CTA ABDOMEN PELVIS FINDINGS Hepatobiliary: No mass identified. Previous cholecystectomy. Stable prominence of extra-hepatic common bile duct, without evidence of intrahepatic biliary dilatation. Pancreas:  No mass or inflammatory changes. Spleen: Within normal limits in size and appearance. Adrenals/Urinary Tract: No masses identified. No evidence of hydronephrosis. Stomach/Bowel: No evidence of obstruction, inflammatory process or abnormal fluid collections. Vascular/Lymphatic: Calcified abdominal aortic atherosclerotic  plaque noted. No evidence of abdominal aortic dissection or aneurysm. No pathologically enlarged lymph nodes. Reproductive: Prior hysterectomy noted. Adnexal regions are unremarkable in appearance. Other:  None. Musculoskeletal:  No suspicious bone lesions identified. Review of the MIP images confirms the above findings. IMPRESSION: No acute findings.  No evidence of aortic dissection or aneurysm. Aortic and coronary artery atherosclerosis. Electronically Signed   By: Earle Gell M.D.   On: 04/06/2017 19:40    EKG: Independently reviewed.  Assessment/Plan Principal Problem:   Chest pain, rule out acute myocardial infarction Active Problems:   HTN (hypertension)   Chronic pain syndrome    1. CP - 1. CP obs pathway 2. Serial trops 3. Tele monitor 4. NPO after midnight 5. Call cards in AM 2. HTN - continue home meds 3. Chronic pain syndrome - 1. Continue home  meds 2. Continue oxy IR 10, note that PCP is trying to taper her off of these, see his note from 4/30 in care everywhere  DVT prophylaxis: Lovenox Code Status: Full Family Communication: Family at bedside Disposition Plan: Home after admit Consults called: None Admission status: Place in [place], [place] [facility] Pager [phone]  If 7AM-7PM, please contact day team taking care of patient [email or web address] Password [facility]  04/06/2017, 9:39 PM

## 2017-04-06 NOTE — ED Notes (Signed)
Patient transported to CT 

## 2017-04-06 NOTE — ED Notes (Signed)
Attempted report x1. 

## 2017-04-06 NOTE — ED Notes (Signed)
Patient transported to X-ray 

## 2017-04-06 NOTE — ED Triage Notes (Signed)
Pt BIB [facility] from home where pt had sudden onset CP with radiation to left arm and into back. Pt also reports nausea, vomiting, and SOB. Received 324 asparin PTA. Refused Nitro r/t hx of BP changes. Pt has hx of stroke with left sided deficits, CHF, and HTN. No IV, pt refused, has port.

## 2017-04-06 NOTE — ED Provider Notes (Signed)
[facility] DEPT Provider Note   CSN: [id number] Arrival date & time: 04/06/17  1551     History   Chief Complaint Chief Complaint  Patient presents with  . Chest Pain    HPI Caitlyn Pacheco is a 61 y.o. female.  HPI  61 year old female presents with chest pain. States originally her chest pain started about 3 days ago but was not as bad. Today at around 2:30 PM she also developed worse pain. It is sharp and in the middle of her chest. Wraps around to her back. No pleuritic symptoms. She feels short of breath and the pain radiates down her left arm. She feels like her left arm and leg are weaker than normal. 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 She later tells me that weakness has been progressive over the last few days. No vomiting. She refused nitroglycerin in the EMS truck due to them not being able to get a blood pressure. However she states she will take it now. She reports a normal cardiac cath for or so years ago at an outside hospital is not sure exactly where or when she got it. She has a history of congestive heart failure and feels like she's a little more swollen over the last 1 week, left worse than right lower extremity. Pain is currently severe, 10/10. She took a full dose aspirin prior to arrival here.  Past Medical History:  Diagnosis Date  . CHF (congestive heart failure) ([facility])   . Chronic pain syndrome   . HCV (hepatitis C virus)   . Hypertension   . Osteoarthritis   . Stroke [facility])     Patient Active Problem List   Diagnosis Date Noted  . Chest pain, rule out acute myocardial infarction 04/06/2017  . HTN (hypertension) 04/06/2017  . Chronic pain syndrome 04/06/2017    Past Surgical History:  Procedure Laterality Date  . ABDOMINAL HYSTERECTOMY    . ABDOMINAL SURGERY    . CHOLECYSTECTOMY    . THYROID SURGERY      OB History    No data available       Home Medications    Prior to Admission  medications   Medication Sig Start Date End Date Taking? Authorizing Provider  albuterol (ACCUNEB) 1.25 MG/3ML nebulizer solution Take 3 mLs by nebulization 3 (three) times daily as needed for wheezing or shortness of breath.  10/31/11  Yes [provider]  albuterol (PROAIR HFA) 108 (90 Base) MCG/ACT inhaler Inhale 2 puffs into the lungs every 6 (six) hours as needed for wheezing or shortness of breath.   Yes [provider]  amLODipine (NORVASC) 10 MG tablet Take 10 mg by mouth daily. 03/20/17  Yes [provider]  aspirin 81 MG chewable tablet Chew 81 mg by mouth daily. 08/13/12  Yes [provider]  budesonide-formoterol (SYMBICORT) 160-4.5 MCG/ACT inhaler Inhale 2 puffs into the lungs 2 (two) times daily.   Yes [provider]  busPIRone (BUSPAR) 30 MG tablet Take 30 mg by mouth 3 (three) times daily. 03/13/17  Yes [provider]  carvedilol (COREG) 25 MG tablet Take 25 mg by mouth 2 (two) times daily. 01/18/17  Yes [provider]  clopidogrel (PLAVIX) 75 MG tablet Take 75 mg by mouth daily.   Yes [provider]  fluticasone (FLONASE ALLERGY RELIEF) 50 MCG/ACT nasal spray Place 2 sprays into both nostrils daily as needed for allergies or rhinitis.   Yes [provider]  Fluticasone-Salmeterol (ADVAIR DISKUS) 250-50 MCG/DOSE AEPB Inhale 1 puff into the lungs 2 (two) times daily.   Yes [provider]  furosemide (LASIX) 40 MG tablet Take 40 mg by mouth daily.   Yes [provider]  losartan (COZAAR) 50 MG tablet Take 50 mg by mouth daily.   Yes [provider]  lubiprostone (AMITIZA) 24 MCG capsule Take 24 mcg by mouth 2 (two) times daily with a meal.   Yes [provider]  naloxone (NARCAN) nasal spray 4 mg/0.1 mL 1 spray See admin instructions. INTO 1 NOSTRIL AND MAY REPEAT EVERY 2-3 MINUTES UNTIL EMERGENCY ARRIVES 02/18/17  Yes [provider]  oxycodone (ROXICODONE) 30  MG immediate release tablet Take 30 mg by mouth 5 (five) times daily as needed for pain.    Yes [provider]  pregabalin (LYRICA) 150 MG capsule Take 150 mg by mouth 3 (three) times daily.   Yes [provider]    Family History No family history on file.  Social History Social History  Substance Use Topics  . Smoking status: Current Every Day Smoker    Packs/day: 0.50    Types: Cigarettes  . Smokeless tobacco: Never Used     Comment: pt rpeorts using about 6 per day  . Alcohol use No     Allergies   Corn-containing products; Ibuprofen; Morphine and related; Nsaids; Prednisone; and Statins   Review of Systems Review of Systems  Respiratory: Positive for shortness of breath. Negative for cough.   Cardiovascular: Positive for chest pain and leg swelling.  Gastrointestinal: Negative for abdominal pain and vomiting.  Neurological: Positive for weakness.  All other systems reviewed and are negative.    Physical Exam Updated Vital Signs BP (!) 157/79 (BP Location: Right Arm)   Pulse (!) 58   Temp 98.6 F (37 C) (Oral)   Resp 18   Ht 6\' 6"  (1.981 m)   Wt 163 lb (73.9 kg)   SpO2 100%   BMI 18.84 kg/m   Physical Exam  Constitutional: She is oriented to person, place, and time. She appears well-developed and well-nourished.  HENT:  Head: Normocephalic and atraumatic.  Right Ear: External ear normal.  Left Ear: External ear normal.  Nose: Nose normal.  Eyes: Right eye exhibits no discharge. Left eye exhibits no discharge.  Cardiovascular: Normal rate, regular rhythm and normal heart sounds.   Pulses:      Radial pulses are 2+ on the right side, and 2+ on the left side.  Pulmonary/Chest: Effort normal and breath sounds normal. She exhibits no tenderness.  Abdominal: Soft. There is no tenderness.  Musculoskeletal: She exhibits edema (Mild LE edema at ankle/feet, L>R).  Neurological: She is alert and oriented to person, place, and time.  5/5 Str  RUE, RLE. 4/5 Strength LLE. 3/5 strength LUE  Skin: Skin is warm and dry. She is not diaphoretic.  Nursing note and vitals reviewed.    ED Treatments / Results  Labs (all labs ordered are listed, but only abnormal results are displayed) Labs Reviewed  BASIC METABOLIC PANEL - Abnormal; Notable for the following:       Result Value   Potassium 3.3 (*)    Calcium 8.6 (*)    All other components within normal limits  CBC WITH DIFFERENTIAL/PLATELET - Abnormal; Notable for the following:    WBC 3.9 (*)    HCT 35.9 (*)    MCV 72.1 (*)    MCH 24.1 (*)  RDW 15.6 (*)    Neutro Abs 1.1 (*)    All other components within normal limits  D-DIMER, QUANTITATIVE (NOT AT [facility]) - Abnormal; Notable for the following:    D-Dimer, Quant 0.84 (*)    All other components within normal limits  RAPID URINE DRUG SCREEN, HOSP PERFORMED - Abnormal; Notable for the following:    Benzodiazepines POSITIVE (*)    All other components within normal limits  TROPONIN I  TROPONIN I  BRAIN NATRIURETIC PEPTIDE  HIV ANTIBODY (ROUTINE TESTING)  TROPONIN I  TROPONIN I  I-STAT CREATININE, ED    EKG  EKG Interpretation  Date/Time:  Thursday Apr 06 2017 15:53:35 EDT Ventricular Rate:  62 PR Interval:    QRS Duration: 97 QT Interval:  416 QTC Calculation: 423 R Axis:   49 Text Interpretation:  Normal sinus rhythm no acute ST/T changes No old tracing to compare Confirmed by Sherwood Gambler [phone]) on 04/06/2017 4:01:33 PM       EKG Interpretation  Date/Time:  Thursday Apr 06 2017 18:33:53 EDT Ventricular Rate:  67 PR Interval:    QRS Duration: 93 QT Interval:  425 QTC Calculation: 449 R Axis:   56 Text Interpretation:  Sinus rhythm no acute ST/T changes no significant change since earlier in the day Confirmed by Sherwood Gambler [phone]) on 04/06/2017 7:01:18 PM       Radiology Dg Chest 2 View  Result Date: 04/06/2017 CLINICAL DATA:  Chest and back pain for 2 days. EXAM: CHEST  2 VIEW COMPARISON:   Chest CTA 12/31/2016 and radiographs 11/14/2016 FINDINGS: Left jugular Port-A-Cath terminates over the SVC, unchanged. The cardiac silhouette is upper limits of normal in size. The lungs are clear. No pleural effusion or pneumothorax is identified. No acute osseous abnormality is seen. IMPRESSION: No active cardiopulmonary disease. Electronically Signed   By: Logan Bores M.D.   On: 04/06/2017 17:09   Ct Head Wo Contrast  Result Date: 04/06/2017 CLINICAL DATA:  Left-sided weakness EXAM: CT HEAD WITHOUT CONTRAST TECHNIQUE: Contiguous axial images were obtained from the base of the skull through the vertex without intravenous contrast. COMPARISON:  11/14/2016 FINDINGS: Brain: No acute territorial infarction, hemorrhage or intracranial mass is seen. Old lacunar infarcts in the right basal ganglia. The ventricles are nonenlarged. Vascular: No hyperdense vessels.  Carotid artery calcifications. Skull: Normal. Negative for fracture or focal lesion. Sinuses/Orbits: No acute finding. Other: None IMPRESSION: No CT evidence for acute intracranial abnormality. Electronically Signed   By: Donavan Foil M.D.   On: 04/06/2017 19:29   Ct Angio Chest/abd/pel For Dissection W And/or Wo Contrast  Result Date: 04/06/2017 CLINICAL DATA:  Left-sided chest pain and weakness. Clinical suspicion for aortic dissection. EXAM: CT ANGIOGRAPHY CHEST, ABDOMEN AND PELVIS TECHNIQUE: Multidetector CT imaging through the chest, abdomen and pelvis was performed using the standard protocol during bolus administration of intravenous contrast. Multiplanar reconstructed images and MIPs were obtained and reviewed to evaluate the vascular anatomy. CONTRAST:  100 mL Isovue 370 COMPARISON:  Chest CTA on 12/31/2016 and AP CT on 10/22/2016 FINDINGS: CTA CHEST FINDINGS Cardiovascular: No evidence of thoracic aortic dissection or aneurysm. No evidence of acute pulmonary embolism. Normal heart size. No evidence of pericardial effusion. Aortic and coronary  artery atherosclerosis. Mediastinum/Nodes: No masses or pathologically enlarged lymph nodes identified. Previous right thyroidectomy. Lungs/Pleura: No pulmonary mass, infiltrate, or effusion. Stable 1-2 mm pulmonary nodule in anterior right lower lobe. Musculoskeletal: No suspicious bone lesions identified. Review of the MIP images confirms the above findings. CTA  ABDOMEN PELVIS FINDINGS Hepatobiliary: No mass identified. Previous cholecystectomy. Stable prominence of extra-hepatic common bile duct, without evidence of intrahepatic biliary dilatation. Pancreas:  No mass or inflammatory changes. Spleen: Within normal limits in size and appearance. Adrenals/Urinary Tract: No masses identified. No evidence of hydronephrosis. Stomach/Bowel: No evidence of obstruction, inflammatory process or abnormal fluid collections. Vascular/Lymphatic: Calcified abdominal aortic atherosclerotic plaque noted. No evidence of abdominal aortic dissection or aneurysm. No pathologically enlarged lymph nodes. Reproductive: Prior hysterectomy noted. Adnexal regions are unremarkable in appearance. Other:  None. Musculoskeletal:  No suspicious bone lesions identified. Review of the MIP images confirms the above findings. IMPRESSION: No acute findings.  No evidence of aortic dissection or aneurysm. Aortic and coronary artery atherosclerosis. Electronically Signed   By: Earle Gell M.D.   On: 04/06/2017 19:40    Procedures Procedures (including critical care time)  Medications Ordered in ED Medications  nitroGLYCERIN (NITROSTAT) SL tablet 0.4 mg (0.4 mg Sublingual Given 04/06/17 1743)  sodium chloride flush (NS) 0.9 % injection 10-40 mL (not administered)  acetaminophen (TYLENOL) tablet 650 mg (not administered)  ondansetron (ZOFRAN) injection 4 mg (not administered)  oxyCODONE (Oxy IR/ROXICODONE) immediate release tablet 10 mg (10 mg Oral Given 04/06/17 2155)  enoxaparin (LOVENOX) injection 40 mg (40 mg Subcutaneous Given 04/06/17  2332)  albuterol (PROVENTIL) (2.5 MG/3ML) 0.083% nebulizer solution 2.5 mg (not administered)  aspirin chewable tablet 81 mg (not administered)  clopidogrel (PLAVIX) tablet 75 mg (not administered)  carvedilol (COREG) tablet 25 mg (25 mg Oral Given 04/06/17 2332)  busPIRone (BUSPAR) tablet 30 mg (30 mg Oral Given 04/06/17 2331)  amLODipine (NORVASC) tablet 10 mg (not administered)  fluticasone furoate-vilanterol (BREO ELLIPTA) 200-25 MCG/INH 1 puff (not administered)  pregabalin (LYRICA) capsule 150 mg (150 mg Oral Given 04/06/17 2330)  lubiprostone (AMITIZA) capsule 24 mcg (not administered)  losartan (COZAAR) tablet 50 mg (not administered)  furosemide (LASIX) tablet 40 mg (40 mg Oral Given 04/06/17 2332)  fluticasone (FLONASE) 50 MCG/ACT nasal spray 2 spray (not administered)  fentaNYL (SUBLIMAZE) injection 50 mcg (50 mcg Intravenous Given 04/06/17 1741)  HYDROmorphone (DILAUDID) injection 1 mg (1 mg Intravenous Given 04/06/17 1846)  iopamidol (ISOVUE-370) 76 % injection (100 mLs  Contrast Given 04/06/17 1900)  potassium chloride SA (K-DUR,KLOR-CON) CR tablet 40 mEq (40 mEq Oral Given 04/06/17 2136)     Initial Impression / Assessment and Plan / ED Course  I have reviewed the triage vital signs and the nursing notes.  Pertinent labs & imaging results that were available during my care of the patient were reviewed by me and considered in my medical decision making (see chart for details).     Patient's chest pain is atypical but she has significant risk factors and thus will be admitted for ACS rule out. Discussed with Dr. Alcario Drought who will admit. Given her concern for worsening weakness in her left arm in association with chest pain wrapping around to her back, CT obtained at dissection was ruled out. No findings of STEMI or obvious acute MI at this time.  Final Clinical Impressions(s) / ED Diagnoses   Final diagnoses:  Nonspecific chest pain    New Prescriptions Current Discharge  Medication List       Sherwood Gambler, MD 04/06/17 2351

## 2017-04-07 ENCOUNTER — Observation Stay (HOSPITAL_BASED_OUTPATIENT_CLINIC_OR_DEPARTMENT_OTHER): Payer: Medicare Other

## 2017-04-07 DIAGNOSIS — I9589 Other hypotension: Secondary | ICD-10-CM | POA: Diagnosis not present

## 2017-04-07 DIAGNOSIS — E876 Hypokalemia: Secondary | ICD-10-CM | POA: Diagnosis not present

## 2017-04-07 DIAGNOSIS — I5030 Unspecified diastolic (congestive) heart failure: Secondary | ICD-10-CM

## 2017-04-07 DIAGNOSIS — G894 Chronic pain syndrome: Secondary | ICD-10-CM | POA: Diagnosis not present

## 2017-04-07 DIAGNOSIS — R079 Chest pain, unspecified: Secondary | ICD-10-CM | POA: Diagnosis not present

## 2017-04-07 DIAGNOSIS — F329 Major depressive disorder, single episode, unspecified: Secondary | ICD-10-CM | POA: Diagnosis not present

## 2017-04-07 DIAGNOSIS — I959 Hypotension, unspecified: Secondary | ICD-10-CM | POA: Diagnosis not present

## 2017-04-07 DIAGNOSIS — R0789 Other chest pain: Secondary | ICD-10-CM | POA: Diagnosis not present

## 2017-04-07 LAB — ECHOCARDIOGRAM COMPLETE
Ao-asc: 25 cm
E/e' ratio: 11.76
FS: 39 % (ref 28–44)
Height: 66 in
IVS/LV PW RATIO, ED: 1.25
LA ID, A-P, ES: 29 mm
LA diam end sys: 29 mm
LA diam index: 1.59 cm/m2
LA vol A4C: 41.4 mL
LA vol index: 26.1 mL/m2
LA vol: 47.5 mL
LV E/e' medial: 11.76
LV E/e'average: 11.76
LV PW d: 12 mm — AB (ref 0.6–1.1)
LV e' LATERAL: 5.44 cm/s
LVOT SV: 53 mL
LVOT VTI: 20.9 cm
LVOT area: 2.54 cm2
LVOT diameter: 18 mm
LVOT peak grad rest: 4 mmHg
LVOT peak vel: 101 cm/s
MV pk A vel: 68.9 m/s
MV pk E vel: 64 m/s
TAPSE: 19.5 mm
TDI e' lateral: 5.44
TDI e' medial: 5.55
Weight: 2553.6 [oz_av]

## 2017-04-07 LAB — TROPONIN I
Troponin I: <0.03 ng/mL (ref <0.03)
Troponin I: <0.03 ng/mL (ref <0.03)

## 2017-04-07 LAB — BRAIN NATRIURETIC PEPTIDE: B Natriuretic Peptide: 58.5 pg/mL (ref 0.0–100.0)

## 2017-04-07 LAB — HIV ANTIBODY (ROUTINE TESTING W REFLEX): HIV Screen 4th Generation wRfx: NONREACTIVE

## 2017-04-07 MED ORDER — SODIUM CHLORIDE 0.9 % IV SOLN
INTRAVENOUS | Status: DC
  Administered 2017-04-07: 15:00:00 via INTRAVENOUS
  Administered 2017-04-08: 1000 mL via INTRAVENOUS

## 2017-04-07 MED ORDER — SODIUM CHLORIDE 0.9 % IV BOLUS (SEPSIS)
1000.0000 mL | Freq: Once | INTRAVENOUS | Status: AC
  Administered 2017-04-07: 1000 mL via INTRAVENOUS

## 2017-04-07 NOTE — Progress Notes (Signed)
Initial Nutrition Assessment  DOCUMENTATION CODES:   Not applicable  INTERVENTION:    Snacks TID between meals  NUTRITION DIAGNOSIS:   Unintentional weight loss related to other (see comment) (difficulty chewing) as evidenced by percent weight loss.  GOAL:   Patient will meet greater than or equal to 90% of their needs  MONITOR:   PO intake, Labs, I & O's  REASON FOR ASSESSMENT:   Malnutrition Screening Tool    ASSESSMENT:   61 y.o. female with PMH of stroke, chronic pain syndrome, HCV, HTN.  Patient presented to the ED with c/o chest pain.   Patient reports usual weight 178 lbs before recent dental surgery (tooth extractions). Since that time, she has lost weight. Down to 159 lbs this admission. Nutrition-Focused physical exam completed. Findings are no fat depletion, no muscle depletion, and no edema.  Patient still having difficulty chewing tough foods. She refused to receive Ensure or Boost supplements, but agreed to snacks TID between meals. Labs and medications reviewed.  Diet Order:  Diet Heart Room service appropriate? Yes; Fluid consistency: Thin  Skin:  Reviewed, no issues  Last BM:  unknown  Height:   Ht Readings from Last 1 Encounters:  04/07/17 5\' 6"  (1.676 m)    Weight:   Wt Readings from Last 1 Encounters:  04/06/17 159 lb 9.6 oz (72.4 kg)    Ideal Body Weight:  59.1 kg  BMI:  Body mass index is 25.76 kg/m.  Estimated Nutritional Needs:   Kcal:  1800-2000  Protein:  85-100 gm  Fluid:  1.8-2 L  EDUCATION NEEDS:   No education needs identified at this time  Molli Barrows, [place], [place], [place] Pager [phone] After Hours Pager [phone]

## 2017-04-07 NOTE — Progress Notes (Signed)
  Echocardiogram 2D Echocardiogram has been performed.  Caitlyn Pacheco 04/07/2017, 3:19 PM

## 2017-04-07 NOTE — Consult Note (Signed)
Cardiology Consultation:   Patient ID: Caitlyn Pacheco; [id number]; 07-30-1956   Admit date: 04/06/2017 Date of Consult: 04/07/2017  Primary Care Provider: System, Pcp Not In Primary Cardiologist: New Primary Electrophysiologist:  Dhungel   Patient Profile:   Caitlyn Pacheco is a 61 y.o. female with a hx of CHF and reported stroke who is being seen today for the evaluation of chest pain at the request of Dr. Clementeen Graham.   History of Present Illness:   Caitlyn Pacheco reports a diagnosis of congestive heart failure and had a stroke in 6440, of uncertain etiology. She has residual left-sided weakness.  She reports left-sided chest discomfort radiating towards her left shoulder and down her left arm, constant during the last 2 days, not related to physical activity, position or movement. Described as sharp stabbing pain. Not pleuritic. Not clearly associated with meals. Not relieved by nitroglycerin.  She states it is similar to the chest pain that she associates with the previous diagnosis of congestive heart failure. She has not had leg edema recently. She does not have orthopnea or PND. She also worried that the pain might be an indication she is having another stroke.  Review of notes in care everywhere showed that her complaints of chest pain are chronic and were being treated with narcotic analgesics. Her primary care physician was in the process of weaning her off these medications and prescribed antidepressants. She also initiated workup for collagen vascular disease.  Electrocardiogram is normal and 3 sets of consecutive cardiac enzymes are normal. Her BNP is normal. CT echogram of her chest does not show evidence of aortic aneurysm or dissection and describes atherosclerosis in the aorta and coronary arteries. On my review there is actually very scanty calcified plaque seen in the thoracic aorta and a tiny amount of calcium in the distribution of the coronary arteries. Although this  study was not designed to be a coronary CT angiogram, good contrast flow seen in all 3 major coronary arteries.  Reports previous cardiac catheterization with normal coronary arteries, but does not remember exactly when or where this was performed. Had a "chemical stress test" which she tolerated poorly, with which was a normal study in [place]. Ports that her cholesterol was very high in the past. Today her LDL is only 88. She has well treated hypertension  She reports the stroke happened about 15 years ago. In careeverywhere there is documentation of normal CT, MRI and MRA of the head in 2003. There was no evidence of acute or chronic stroke. CT of the head performed in 2017 in our institution showed old lacunar infarcts in the right basal ganglia.  Has chronic hepatitis C, but was told that she does not need treatment since it is "inactive". As not been told that she might have cirrhosis.  She has a chronic Port-A-Cath in the left subclavian area, because "she has no veins".  Past Medical History:  Diagnosis Date  . CHF (congestive heart failure) ([place])   . Chronic pain syndrome   . HCV (hepatitis C virus)   . Hypertension   . Osteoarthritis   . Stroke [facility])     Past Surgical History:  Procedure Laterality Date  . ABDOMINAL HYSTERECTOMY    . ABDOMINAL SURGERY    . CHOLECYSTECTOMY    . THYROID SURGERY       Inpatient Medications: Scheduled Meds: . amLODipine  10 mg Oral Daily  . aspirin  81 mg Oral Daily  . busPIRone  30 mg  Oral TID  . carvedilol  25 mg Oral BID  . clopidogrel  75 mg Oral Daily  . enoxaparin (LOVENOX) injection  40 mg Subcutaneous Q24H  . fluticasone furoate-vilanterol  1 puff Inhalation Daily  . furosemide  40 mg Oral Daily  . losartan  50 mg Oral Daily  . lubiprostone  24 mcg Oral BID WC  . pregabalin  150 mg Oral TID   Continuous Infusions:  PRN Meds: acetaminophen, albuterol, fluticasone, nitroGLYCERIN, ondansetron (ZOFRAN) IV, oxyCODONE, sodium  chloride flush  Allergies:    Allergies  Allergen Reactions  . Corn-Containing Products     Cannot digest this  . Ibuprofen     Per her doctor  . Morphine And Related Other (See Comments)    Per her doctor  . Nsaids     Per her doctor  . Prednisone Other (See Comments)    Per her doctor  . Statins Other (See Comments)    Affected liver enzymes    Social History:   Social History   Social History  . Marital status: Single    Spouse name: N/A  . Number of children: N/A  . Years of education: N/A   Occupational History  . Not on file.   Social History Main Topics  . Smoking status: Current Every Day Smoker    Packs/day: 0.50    Types: Cigarettes  . Smokeless tobacco: Never Used     Comment: pt rpeorts using about 6 per day  . Alcohol use No  . Drug use: No  . Sexual activity: No   Other Topics Concern  . Not on file   Social History Narrative  . No narrative on file    Family History:   The patient reports that she comes from a family of 51 brothers and sisters. She reports that her twin sister had bypass surgery. She has passed away. Her mother also had heart disease. Another sister also had hepatitis C cured with anti-viral therapy.  ROS:  Please see the history of present illness.  All other ROS reviewed and negative.     Physical Exam/Data:   Vitals:   04/06/17 2115 04/06/17 2130 04/06/17 2215 04/06/17 2343  BP: (!) 163/95 (!) 158/94 (!) 150/99 (!) 157/79  Pulse: 60 (!) 57 (!) 54 (!) 58  Resp: 14 14 20 18   Temp:    98.6 F (37 C)  TempSrc:    Oral  SpO2: 99% 97% 100% 100%  Weight:    159 lb 9.6 oz (72.4 kg)  Height:        Intake/Output Summary (Last 24 hours) at 04/07/17 0848 Last data filed at 04/06/17 2334  Gross per 24 hour  Intake                0 ml  Output              800 ml  Net             -800 ml   Filed Weights   04/06/17 1555 04/06/17 2343  Weight: 163 lb (73.9 kg) 159 lb 9.6 oz (72.4 kg)   Body mass index is 18.44 kg/m.    General:  Well nourished, well developed, in no acute distress HEENT: normal Lymph: no adenopathy Neck: no JVD Endocrine:  No thryomegaly Vascular: No carotid bruits; FA pulses 2+ bilaterally without bruits  Cardiac:  normal S1, S2; RRR; no murmur  Lungs:  clear to auscultation bilaterally, no wheezing, rhonchi or rales  Abd: soft, nontender, no hepatomegaly  Ext: no edema Musculoskeletal:  No deformities, BUE and BLE strength normal and equal Skin: warm and dry  Neuro:  CNs 2-12 intact, no focal abnormalities noted Psych:  Normal affect    EKG:  The EKG was personally reviewed and demonstrates Sinus rhythm, no repolarization abnormalities  Laboratory Data:  Chemistry Recent Labs Lab 04/06/17 1724 04/06/17 1737  NA 140  --   K 3.3*  --   CL 106  --   CO2 26  --   GLUCOSE 91  --   BUN 6  --   CREATININE 0.60 0.60  CALCIUM 8.6*  --   GFRNONAA >60  --   GFRAA >60  --   ANIONGAP 8  --     No results for input(s): PROT, ALBUMIN, AST, ALT, ALKPHOS, BILITOT in the last 168 hours. Hematology Recent Labs Lab 04/06/17 1724  WBC 3.9*  RBC 4.98  HGB 12.0  HCT 35.9*  MCV 72.1*  MCH 24.1*  MCHC 33.4  RDW 15.6*  PLT 189   Cardiac Enzymes Recent Labs Lab 04/06/17 1724 04/06/17 2158 04/07/17 0050 04/07/17 0336  TROPONINI <0.03 <0.03 <0.03 <0.03   No results for input(s): TROPIPOC in the last 168 hours.  BNP Recent Labs Lab 04/07/17 0050  BNP 58.5    DDimer  Recent Labs Lab 04/06/17 1724  DDIMER 0.84*    Radiology/Studies:  Dg Chest 2 View  Result Date: 04/06/2017 CLINICAL DATA:  Chest and back pain for 2 days. EXAM: CHEST  2 VIEW COMPARISON:  Chest CTA 12/31/2016 and radiographs 11/14/2016 FINDINGS: Left jugular Port-A-Cath terminates over the SVC, unchanged. The cardiac silhouette is upper limits of normal in size. The lungs are clear. No pleural effusion or pneumothorax is identified. No acute osseous abnormality is seen. IMPRESSION: No active  cardiopulmonary disease. Electronically Signed   By: Logan Bores M.D.   On: 04/06/2017 17:09   Ct Head Wo Contrast  Result Date: 04/06/2017 CLINICAL DATA:  Left-sided weakness EXAM: CT HEAD WITHOUT CONTRAST TECHNIQUE: Contiguous axial images were obtained from the base of the skull through the vertex without intravenous contrast. COMPARISON:  11/14/2016 FINDINGS: Brain: No acute territorial infarction, hemorrhage or intracranial mass is seen. Old lacunar infarcts in the right basal ganglia. The ventricles are nonenlarged. Vascular: No hyperdense vessels.  Carotid artery calcifications. Skull: Normal. Negative for fracture or focal lesion. Sinuses/Orbits: No acute finding. Other: None IMPRESSION: No CT evidence for acute intracranial abnormality. Electronically Signed   By: Donavan Foil M.D.   On: 04/06/2017 19:29   Ct Angio Chest/abd/pel For Dissection W And/or Wo Contrast  Result Date: 04/06/2017 CLINICAL DATA:  Left-sided chest pain and weakness. Clinical suspicion for aortic dissection. EXAM: CT ANGIOGRAPHY CHEST, ABDOMEN AND PELVIS TECHNIQUE: Multidetector CT imaging through the chest, abdomen and pelvis was performed using the standard protocol during bolus administration of intravenous contrast. Multiplanar reconstructed images and MIPs were obtained and reviewed to evaluate the vascular anatomy. CONTRAST:  100 mL Isovue 370 COMPARISON:  Chest CTA on 12/31/2016 and AP CT on 10/22/2016 FINDINGS: CTA CHEST FINDINGS Cardiovascular: No evidence of thoracic aortic dissection or aneurysm. No evidence of acute pulmonary embolism. Normal heart size. No evidence of pericardial effusion. Aortic and coronary artery atherosclerosis. Mediastinum/Nodes: No masses or pathologically enlarged lymph nodes identified. Previous right thyroidectomy. Lungs/Pleura: No pulmonary mass, infiltrate, or effusion. Stable 1-2 mm pulmonary nodule in anterior right lower lobe. Musculoskeletal: No suspicious bone lesions identified.  Review of the MIP images  confirms the above findings. CTA ABDOMEN PELVIS FINDINGS Hepatobiliary: No mass identified. Previous cholecystectomy. Stable prominence of extra-hepatic common bile duct, without evidence of intrahepatic biliary dilatation. Pancreas:  No mass or inflammatory changes. Spleen: Within normal limits in size and appearance. Adrenals/Urinary Tract: No masses identified. No evidence of hydronephrosis. Stomach/Bowel: No evidence of obstruction, inflammatory process or abnormal fluid collections. Vascular/Lymphatic: Calcified abdominal aortic atherosclerotic plaque noted. No evidence of abdominal aortic dissection or aneurysm. No pathologically enlarged lymph nodes. Reproductive: Prior hysterectomy noted. Adnexal regions are unremarkable in appearance. Other:  None. Musculoskeletal:  No suspicious bone lesions identified. Review of the MIP images confirms the above findings. IMPRESSION: No acute findings.  No evidence of aortic dissection or aneurysm. Aortic and coronary artery atherosclerosis. Electronically Signed   By: Earle Gell M.D.   On: 04/06/2017 19:40    Assessment and Plan:   1. Noncardiac chest pain - symptoms do not suggest coronary artery disease and she does not have EKG changes or enzyme abnormalities. Symptoms does not sound pleuritic, but it is not unreasonable to check an echo for evidence of pericardial disease. She ports that she is unable to walk on a treadmill, I don't think nuclear perfusion testing is warranted. 2. HTN - currently blood pressure is elevated. Await to see what happens after she receives all her usual home medications. Consider component of opiate withdrawal as she is being asked to wean down her dose of oxycodone. 3. "CHF"- I don't see substantiation of this in her records or suggestion of heart failure on physical exam. She is deathly not in acute heart failure at this point. She appears euvolemic. Will check echo. 4. History of lacunar infarct right  basal ganglia - this could explain her left-sided weakness, although interestingly these abnormalities were not present when she initially presented with her complaints of stroke 15 years ago. 5. Chronic hepatitis C 6. Chronic opiate use/dependency   Signed, Sanda Klein, MD  04/07/2017 8:48 AM

## 2017-04-07 NOTE — Progress Notes (Addendum)
The patients blood pressure soft in the 80's SBP (Please see flow sheet). Notified Dhungel. He discontinued blood pressure medications and give 1 liter bolus. Will continue to monitor the client closely.   Saddie Benders RN  Addendum: Paged Dhungel after bolus blood pressure still soft in the 90's SBP. Discontinued Buspar and Lyrica. Ordered NS 174mL/hr. I will continue to monitor her closely.  Saddie Benders RN

## 2017-04-07 NOTE — Progress Notes (Signed)
PROGRESS NOTE                                                                                                                                                                                                             Patient Demographics:    Caitlyn Pacheco, is a 61 y.o. female, DOB - [birth year], VXY:[id number]  Admit date - 04/06/2017   Admitting Physician Etta Quill, DO  Outpatient Primary MD for the patient is System, Pcp Not In  LOS - 0  Outpatient Specialists:None  Chief Complaint  Patient presents with  . Chest Pain       Brief Narrative   61 year old female with history of stroke, chronic pain syndrome, hep C, hypertension, depression, presented with sharp midsternal chest pain for 3 days duration progressively getting worse. No aggravating or relieving factors. In the ED vitals were stable. Received sublingual nitroglycerin and 1 mg IV Dilaudid. Initial troponin was negative and EKG unremarkable. Placed on observation.    Subjective:    Still has off and on chest discomfort but better since admission.   Assessment  & Plan :    Principal Problem: Atypical chest pain Appears musculoskeletal. EKG and serial enzymes are negative. 2-D echo pending. Cardiac consult appreciated. No need for nuclear stress test.  Hypotension Blood pressure was elevated on admission but patient is hypotensive since this morning. I had on her home blood pressure medications and given 1 L IV normal saline bolus. Place on maintenance fluid. Patient is on multiple blood pressure medications (amlodipine, Coreg, losartan and Lasix) Patient also found to be increasingly sleepy. Hold her andLyrica.  Active Problems:    Chronic pain syndrome Her PCP has been decreasing her home dose of oxycodone. Will place her on current dose (10 mg every 6 hours when necessary). Monitor for sedation.  Established at pain clinic  History of  stroke Continue aspirin and Plavix. Allergies to statin.      Code Status : Full code  Family Communication  : None at bedside  Disposition Plan  : Home in a.m. if blood pressure stable.  Barriers For Discharge : Hypertension  Consults  :  Cardiology  Procedures  :  Pending 2-D echo  DVT Prophylaxis  :  Lovenox -  Lab Results  Component Value Date   PLT 189 04/06/2017    Antibiotics  :  Anti-infectives    None        Objective:   Vitals:   04/07/17 1400 04/07/17 1407 04/07/17 1432 04/07/17 1437  BP:  (!) 91/49 (!) 91/49 (!) 89/48  Pulse:  60 60 60  Resp:      Temp:   98.9 F (37.2 C)   TempSrc:   Oral   SpO2:  93% 96% 92%  Weight:      Height: 5\' 6"  (1.676 m)       Wt Readings from Last 3 Encounters:  04/06/17 72.4 kg (159 lb 9.6 oz)     Intake/Output Summary (Last 24 hours) at 04/07/17 1504 Last data filed at 04/06/17 2334  Gross per 24 hour  Intake                0 ml  Output              800 ml  Net             -800 ml     Physical Exam  Gen: Middle aged female not in distress HEENT:  moist mucosa, supple neck Chest: clear b/l, no added sounds, CVS: N S1&S2, no murmurs, rubs or gallop GI: soft, NT, ND, BS+ Musculoskeletal: warm, no edema     Data Review:    CBC  Recent Labs Lab 04/06/17 1724  WBC 3.9*  HGB 12.0  HCT 35.9*  PLT 189  MCV 72.1*  MCH 24.1*  MCHC 33.4  RDW 15.6*  LYMPHSABS 2.4  MONOABS 0.3  EOSABS 0.1  BASOSABS 0.0    Chemistries   Recent Labs Lab 04/06/17 1724 04/06/17 1737  NA 140  --   K 3.3*  --   CL 106  --   CO2 26  --   GLUCOSE 91  --   BUN 6  --   CREATININE 0.60 0.60  CALCIUM 8.6*  --    ------------------------------------------------------------------------------------------------------------------ No results for input(s): CHOL, HDL, LDLCALC, TRIG, CHOLHDL, LDLDIRECT in the last 72 hours.  No results found for:  HGBA1C ------------------------------------------------------------------------------------------------------------------ No results for input(s): TSH, T4TOTAL, T3FREE, THYROIDAB in the last 72 hours.  Invalid input(s): FREET3 ------------------------------------------------------------------------------------------------------------------ No results for input(s): VITAMINB12, FOLATE, FERRITIN, TIBC, IRON, RETICCTPCT in the last 72 hours.  Coagulation profile No results for input(s): INR, PROTIME in the last 168 hours.   Recent Labs  04/06/17 1724  DDIMER 0.84*    Cardiac Enzymes  Recent Labs Lab 04/06/17 2158 04/07/17 0050 04/07/17 0336  TROPONINI <0.03 <0.03 <0.03   ------------------------------------------------------------------------------------------------------------------    Component Value Date/Time   BNP 58.5 04/07/2017 0050    Inpatient Medications  Scheduled Meds: . aspirin  81 mg Oral Daily  . clopidogrel  75 mg Oral Daily  . enoxaparin (LOVENOX) injection  40 mg Subcutaneous Q24H  . fluticasone furoate-vilanterol  1 puff Inhalation Daily  . lubiprostone  24 mcg Oral BID WC   Continuous Infusions: . sodium chloride     PRN Meds:.acetaminophen, albuterol, fluticasone, nitroGLYCERIN, ondansetron (ZOFRAN) IV, oxyCODONE, sodium chloride flush  Micro Results No results found for this or any previous visit (from the past 240 hour(s)).  Radiology Reports Dg Chest 2 View  Result Date: 04/06/2017 CLINICAL DATA:  Chest and back pain for 2 days. EXAM: CHEST  2 VIEW COMPARISON:  Chest CTA 12/31/2016 and radiographs 11/14/2016 FINDINGS: Left jugular Port-A-Cath terminates over the SVC, unchanged. The cardiac silhouette is upper limits of normal in size. The lungs are clear. No pleural effusion or pneumothorax  is identified. No acute osseous abnormality is seen. IMPRESSION: No active cardiopulmonary disease. Electronically Signed   By: Logan Bores M.D.   On:  04/06/2017 17:09   Ct Head Wo Contrast  Result Date: 04/06/2017 CLINICAL DATA:  Left-sided weakness EXAM: CT HEAD WITHOUT CONTRAST TECHNIQUE: Contiguous axial images were obtained from the base of the skull through the vertex without intravenous contrast. COMPARISON:  11/14/2016 FINDINGS: Brain: No acute territorial infarction, hemorrhage or intracranial mass is seen. Old lacunar infarcts in the right basal ganglia. The ventricles are nonenlarged. Vascular: No hyperdense vessels.  Carotid artery calcifications. Skull: Normal. Negative for fracture or focal lesion. Sinuses/Orbits: No acute finding. Other: None IMPRESSION: No CT evidence for acute intracranial abnormality. Electronically Signed   By: Donavan Foil M.D.   On: 04/06/2017 19:29   Ct Angio Chest/abd/pel For Dissection W And/or Wo Contrast  Result Date: 04/06/2017 CLINICAL DATA:  Left-sided chest pain and weakness. Clinical suspicion for aortic dissection. EXAM: CT ANGIOGRAPHY CHEST, ABDOMEN AND PELVIS TECHNIQUE: Multidetector CT imaging through the chest, abdomen and pelvis was performed using the standard protocol during bolus administration of intravenous contrast. Multiplanar reconstructed images and MIPs were obtained and reviewed to evaluate the vascular anatomy. CONTRAST:  100 mL Isovue 370 COMPARISON:  Chest CTA on 12/31/2016 and AP CT on 10/22/2016 FINDINGS: CTA CHEST FINDINGS Cardiovascular: No evidence of thoracic aortic dissection or aneurysm. No evidence of acute pulmonary embolism. Normal heart size. No evidence of pericardial effusion. Aortic and coronary artery atherosclerosis. Mediastinum/Nodes: No masses or pathologically enlarged lymph nodes identified. Previous right thyroidectomy. Lungs/Pleura: No pulmonary mass, infiltrate, or effusion. Stable 1-2 mm pulmonary nodule in anterior right lower lobe. Musculoskeletal: No suspicious bone lesions identified. Review of the MIP images confirms the above findings. CTA ABDOMEN PELVIS  FINDINGS Hepatobiliary: No mass identified. Previous cholecystectomy. Stable prominence of extra-hepatic common bile duct, without evidence of intrahepatic biliary dilatation. Pancreas:  No mass or inflammatory changes. Spleen: Within normal limits in size and appearance. Adrenals/Urinary Tract: No masses identified. No evidence of hydronephrosis. Stomach/Bowel: No evidence of obstruction, inflammatory process or abnormal fluid collections. Vascular/Lymphatic: Calcified abdominal aortic atherosclerotic plaque noted. No evidence of abdominal aortic dissection or aneurysm. No pathologically enlarged lymph nodes. Reproductive: Prior hysterectomy noted. Adnexal regions are unremarkable in appearance. Other:  None. Musculoskeletal:  No suspicious bone lesions identified. Review of the MIP images confirms the above findings. IMPRESSION: No acute findings.  No evidence of aortic dissection or aneurysm. Aortic and coronary artery atherosclerosis. Electronically Signed   By: Earle Gell M.D.   On: 04/06/2017 19:40    Time Spent in minutes  25   Louellen Molder M.D on 04/07/2017 at 3:04 PM  Between 7am to 7pm - Pager - [phone]  After 7pm go to [email or web address] - password [facility]  [facility] -  Office  [phone]

## 2017-04-07 NOTE — Progress Notes (Signed)
   04/07/17 1027  Clinical Encounter Type  Visited With Patient  Visit Type Initial  Referral From Nurse  Consult/Referral To Chaplain  Recommendations follow up  Stress Factors  Patient Stress Factors None identified  Pt is asleep, will follow up.  Chaplain Karli Wickizer A.Nonnie Done, MA-PC, [phone]

## 2017-04-08 DIAGNOSIS — G894 Chronic pain syndrome: Secondary | ICD-10-CM | POA: Diagnosis not present

## 2017-04-08 DIAGNOSIS — F329 Major depressive disorder, single episode, unspecified: Secondary | ICD-10-CM | POA: Diagnosis present

## 2017-04-08 DIAGNOSIS — I959 Hypotension, unspecified: Secondary | ICD-10-CM

## 2017-04-08 DIAGNOSIS — F32A Depression, unspecified: Secondary | ICD-10-CM | POA: Diagnosis present

## 2017-04-08 DIAGNOSIS — E876 Hypokalemia: Secondary | ICD-10-CM | POA: Diagnosis present

## 2017-04-08 DIAGNOSIS — R0789 Other chest pain: Secondary | ICD-10-CM | POA: Diagnosis not present

## 2017-04-08 DIAGNOSIS — R079 Chest pain, unspecified: Secondary | ICD-10-CM | POA: Diagnosis not present

## 2017-04-08 MED ORDER — CARVEDILOL 25 MG PO TABS: 12.5000 mg | ORAL_TABLET | Freq: Two times a day (BID) | ORAL | 0 refills | Status: DC

## 2017-04-08 MED ORDER — FUROSEMIDE 40 MG PO TABS: 20.0000 mg | ORAL_TABLET | Freq: Every day | ORAL | 0 refills | Status: AC

## 2017-04-08 MED ORDER — HEPARIN SOD (PORK) LOCK FLUSH 100 UNIT/ML IV SOLN
500.0000 [IU] | INTRAVENOUS | Status: AC | PRN
  Administered 2017-04-08: 500 [IU]

## 2017-04-08 MED ORDER — OXYCODONE HCL 10 MG PO TABS: 10.0000 mg | ORAL_TABLET | Freq: Four times a day (QID) | ORAL | 0 refills | Status: DC | PRN

## 2017-04-08 MED ORDER — OXYCODONE HCL 10 MG PO TABS: 30.0000 mg | ORAL_TABLET | Freq: Four times a day (QID) | ORAL | 0 refills | Status: DC | PRN

## 2017-04-08 NOTE — Progress Notes (Signed)
Echo reviewed. Normal LV wall motion and LVEF. Hypertensive changes without evidence of CHF. No signs of pericarditis. No plans for additional cardiac workup at this time.  Sanda Klein, MD, [facility] [phone] office [phone] pager'

## 2017-04-08 NOTE — Discharge Instructions (Signed)
Chest Wall Pain Chest wall pain is pain in or around the bones and muscles of your chest. Sometimes, an injury causes this pain. Sometimes, the cause may not be known. This pain may take several weeks or longer to get better. Follow these instructions at home: Pay attention to any changes in your symptoms. Take these actions to help with your pain:  Rest as told by your doctor.  Avoid activities that cause pain. Try not to use your chest, belly (abdominal), or side muscles to lift heavy things.  If directed, apply ice to the painful area:  Put ice in a plastic bag.  Place a towel between your skin and the bag.  Leave the ice on for 20 minutes, 2-3 times per day.  Take over-the-counter and prescription medicines only as told by your doctor.  Do not use tobacco products, including cigarettes, chewing tobacco, and e-cigarettes. If you need help quitting, ask your doctor.  Keep all follow-up visits as told by your doctor. This is important. Contact a doctor if:  You have a fever.  Your chest pain gets worse.  You have new symptoms. Get help right away if:  You feel sick to your stomach (nauseous) or you throw up (vomit).  You feel sweaty or light-headed.  You have a cough with phlegm (sputum) or you cough up blood.  You are short of breath. This information is not intended to replace advice given to you by your health care provider. Make sure you discuss any questions you have with your health care provider. Document Released: 04/25/2008 Document Revised: 04/14/2016 Document Reviewed: 02/02/2015 Elsevier Interactive Patient Education  2017 [employer].

## 2017-04-08 NOTE — Discharge Summary (Addendum)
Physician Discharge Summary  Caitlyn Pacheco BZJ:[id number] DOB: 11-11-1956 DOA: 04/06/2017  PCP: Emmaline Life, MD Admit date: 04/06/2017 Discharge date: 04/08/2017  Admitted From: Home Disposition:  Home  Recommendations for Outpatient Follow-up:  1. Follow up with PCP in 1-2 weeks 2. Blood pressure medications have been readjusted due to low blood pressure.  Home Health: None Equipment/Devices: None  Discharge Condition: Stable CODE STATUS: Full code Diet recommendation: Regular   Discharge Diagnoses:  Principal Problem: Atypical chest pain, likely musculoskeletal    Active Problems:   HTN (hypertension)   Chronic pain syndrome   Hypotension   Chronic depression   Hypokalemia  Brief narrative/history of present illness 61 year old female with history of stroke, chronic pain syndrome, hep C, hypertension, depression, presented with sharp midsternal chest pain for 3 days duration progressively getting worse. No aggravating or relieving factors. In the ED vitals were stable. Received sublingual nitroglycerin and 1 mg IV Dilaudid. Initial troponin was negative and EKG unremarkable.  Principal Problem: Atypical chest pain Appears musculoskeletal. EKG and serial enzymes are negative. 2-D echo shows vigorous LVEF (6-70%) with mild LVH, no wall motion abnormality. Shows grade 1 diastolic dysfunction. No signs of pericarditis. Cardiac consult appreciated. No need for nuclear stress test. Chest pain symptoms improved.  Hypotension Blood pressure was elevated on admission but patient became hypotensive on 5/18. Given IV normal saline bolus followed by maintenance fluid. She is on multiple blood pressure medications (amlodipine, Coreg, losartan and Lasix) all of which were held. Patient was also found to be increasingly sleepy so her Lyrica was held. Blood pressure better today. I will reduce her Coreg dose to 12.5 mg twice a day and Lasix dose to 20 mg daily. Discontinue  amlodipine and losartan. Monitor blood pressure during outpatient follow-up.   Active Problems:    Chronic pain syndrome Her PCP has been decreasing her home dose of oxycodone. She is currently on oxycodone 10 mg every 6 hours as needed and she'll continue this. No prescriptions were provided..  Patient informs that she is established at pain clinic.  History of stroke Continue aspirin and Plavix. Allergy to statin.       Family Communication  : None at bedside  Disposition Plan  : Home  Consults  :  Cardiology  Procedures  :  2-D echo   Discharge Instructions   Allergies as of 04/08/2017      Reactions   Ibuprofen    Per her doctor   Morphine And Related Other (See Comments)   Per her doctor   Nsaids    Per her doctor   Prednisone Other (See Comments)   Per her doctor   Statins Other (See Comments)   Affected liver enzymes      Medication List    STOP taking these medications   amLODipine 10 MG tablet Commonly known as:  NORVASC   losartan 50 MG tablet Commonly known as:  COZAAR     TAKE these medications   ADVAIR DISKUS 250-50 MCG/DOSE Aepb Generic drug:  Fluticasone-Salmeterol Inhale 1 puff into the lungs 2 (two) times daily.   PROAIR HFA 108 (90 Base) MCG/ACT inhaler Generic drug:  albuterol Inhale 2 puffs into the lungs every 6 (six) hours as needed for wheezing or shortness of breath.   albuterol 1.25 MG/3ML nebulizer solution Commonly known as:  ACCUNEB Take 3 mLs by nebulization 3 (three) times daily as needed for wheezing or shortness of breath.   aspirin 81 MG chewable tablet Chew 81 mg by  mouth daily.   budesonide-formoterol 160-4.5 MCG/ACT inhaler Commonly known as:  SYMBICORT Inhale 2 puffs into the lungs 2 (two) times daily.   busPIRone 30 MG tablet Commonly known as:  BUSPAR Take 30 mg by mouth 3 (three) times daily.   carvedilol 25 MG tablet Commonly known as:  COREG Take 0.5 tablets (12.5 mg total) by mouth 2  (two) times daily. What changed:  how much to take   clopidogrel 75 MG tablet Commonly known as:  PLAVIX Take 75 mg by mouth daily.   FLONASE ALLERGY RELIEF 50 MCG/ACT nasal spray Generic drug:  fluticasone Place 2 sprays into both nostrils daily as needed for allergies or rhinitis.   furosemide 40 MG tablet Commonly known as:  LASIX Take 0.5 tablets (20 mg total) by mouth daily. What changed:  how much to take   lubiprostone 24 MCG capsule Commonly known as:  AMITIZA Take 24 mcg by mouth 2 (two) times daily with a meal.   naloxone 4 MG/0.1ML Liqd nasal spray kit Commonly known as:  NARCAN 1 spray See admin instructions. INTO 1 NOSTRIL AND MAY REPEAT EVERY 2-3 MINUTES UNTIL EMERGENCY ARRIVES   Oxycodone HCl 10 MG Tabs Take 1 tablet (10 mg total) by mouth every 6 (six) hours as needed. What changed:  medication strength  how much to take  when to take this  reasons to take this   pregabalin 150 MG capsule Commonly known as:  LYRICA Take 150 mg by mouth 3 (three) times daily.      Follow-up Information    Kimel-Scott, Santiago Glad, MD. Schedule an appointment as soon as possible for a visit in 1 week(s).   Specialty:  Internal Medicine Contact information: [place] [address] [phone]          Allergies  Allergen Reactions  . Ibuprofen     Per her doctor  . Morphine And Related Other (See Comments)    Per her doctor  . Nsaids     Per her doctor  . Prednisone Other (See Comments)    Per her doctor  . Statins Other (See Comments)    Affected liver enzymes        Procedures/Studies: Dg Chest 2 View  Result Date: 04/06/2017 CLINICAL DATA:  Chest and back pain for 2 days. EXAM: CHEST  2 VIEW COMPARISON:  Chest CTA 12/31/2016 and radiographs 11/14/2016 FINDINGS: Left jugular Port-A-Cath terminates over the SVC, unchanged. The cardiac silhouette is upper limits of normal in size. The lungs are clear. No pleural effusion or  pneumothorax is identified. No acute osseous abnormality is seen. IMPRESSION: No active cardiopulmonary disease. Electronically Signed   By: Logan Bores M.D.   On: 04/06/2017 17:09   Ct Head Wo Contrast  Result Date: 04/06/2017 CLINICAL DATA:  Left-sided weakness EXAM: CT HEAD WITHOUT CONTRAST TECHNIQUE: Contiguous axial images were obtained from the base of the skull through the vertex without intravenous contrast. COMPARISON:  11/14/2016 FINDINGS: Brain: No acute territorial infarction, hemorrhage or intracranial mass is seen. Old lacunar infarcts in the right basal ganglia. The ventricles are nonenlarged. Vascular: No hyperdense vessels.  Carotid artery calcifications. Skull: Normal. Negative for fracture or focal lesion. Sinuses/Orbits: No acute finding. Other: None IMPRESSION: No CT evidence for acute intracranial abnormality. Electronically Signed   By: Donavan Foil M.D.   On: 04/06/2017 19:29   Ct Angio Chest/abd/pel For Dissection W And/or Wo Contrast  Result Date: 04/06/2017 CLINICAL DATA:  Left-sided chest pain and weakness.  Clinical suspicion for aortic dissection. EXAM: CT ANGIOGRAPHY CHEST, ABDOMEN AND PELVIS TECHNIQUE: Multidetector CT imaging through the chest, abdomen and pelvis was performed using the standard protocol during bolus administration of intravenous contrast. Multiplanar reconstructed images and MIPs were obtained and reviewed to evaluate the vascular anatomy. CONTRAST:  100 mL Isovue 370 COMPARISON:  Chest CTA on 12/31/2016 and AP CT on 10/22/2016 FINDINGS: CTA CHEST FINDINGS Cardiovascular: No evidence of thoracic aortic dissection or aneurysm. No evidence of acute pulmonary embolism. Normal heart size. No evidence of pericardial effusion. Aortic and coronary artery atherosclerosis. Mediastinum/Nodes: No masses or pathologically enlarged lymph nodes identified. Previous right thyroidectomy. Lungs/Pleura: No pulmonary mass, infiltrate, or effusion. Stable 1-2 mm pulmonary  nodule in anterior right lower lobe. Musculoskeletal: No suspicious bone lesions identified. Review of the MIP images confirms the above findings. CTA ABDOMEN PELVIS FINDINGS Hepatobiliary: No mass identified. Previous cholecystectomy. Stable prominence of extra-hepatic common bile duct, without evidence of intrahepatic biliary dilatation. Pancreas:  No mass or inflammatory changes. Spleen: Within normal limits in size and appearance. Adrenals/Urinary Tract: No masses identified. No evidence of hydronephrosis. Stomach/Bowel: No evidence of obstruction, inflammatory process or abnormal fluid collections. Vascular/Lymphatic: Calcified abdominal aortic atherosclerotic plaque noted. No evidence of abdominal aortic dissection or aneurysm. No pathologically enlarged lymph nodes. Reproductive: Prior hysterectomy noted. Adnexal regions are unremarkable in appearance. Other:  None. Musculoskeletal:  No suspicious bone lesions identified. Review of the MIP images confirms the above findings. IMPRESSION: No acute findings.  No evidence of aortic dissection or aneurysm. Aortic and coronary artery atherosclerosis. Electronically Signed   By: Earle Gell M.D.   On: 04/06/2017 19:40    2-D echo Study Conclusions  - Left ventricle: The cavity size was normal. Wall thickness was   increased in a pattern of mild LVH. There was mild focal basal   hypertrophy of the septum. Systolic function was vigorous. The   estimated ejection fraction was in the range of 65% to 70%. Wall   motion was normal; there were no regional wall motion   abnormalities. Doppler parameters are consistent with abnormal   left ventricular relaxation (grade 1 diastolic dysfunction).  Impressions:  - Vigorous LV systolic function; mild LVH; mild diastolic   dysfunction.  Subjective: Has mild chest discomfort. Blood pressure improved with IV fluids.  Discharge Exam: Vitals:   04/07/17 2125 04/08/17 0328  BP:  (!) 108/52  Pulse:  63   Resp:  16  Temp: 98.9 F (37.2 C) 99.3 F (37.4 C)   Vitals:   04/07/17 1807 04/07/17 1953 04/07/17 2125 04/08/17 0328  BP: 116/68 111/61  (!) 108/52  Pulse: 72   63  Resp:    16  Temp:   98.9 F (37.2 C) 99.3 F (37.4 C)  TempSrc:   Oral Oral  SpO2: 98%   99%  Weight:    76.9 kg (169 lb 8 oz)  Height:        General: Middle aged female not in distress HEENT: Moist mucosa, supple neck Chest: Clear bilaterally  CVS: Normal S1 and S2, no murmurs GI: Soft, nondistended, nontender Musculoskeletal: Warm, no edema     The results of significant diagnostics from this hospitalization (including imaging, microbiology, ancillary and laboratory) are listed below for reference.     Microbiology: No results found for this or any previous visit (from the past 240 hour(s)).   Labs: BNP (last 3 results)  Recent Labs  04/07/17 0050  BNP 58.5   Basic Metabolic Panel:  Recent  Labs Lab 04/06/17 1724 04/06/17 1737  NA 140  --   K 3.3*  --   CL 106  --   CO2 26  --   GLUCOSE 91  --   BUN 6  --   CREATININE 0.60 0.60  CALCIUM 8.6*  --    Liver Function Tests: No results for input(s): AST, ALT, ALKPHOS, BILITOT, PROT, ALBUMIN in the last 168 hours. No results for input(s): LIPASE, AMYLASE in the last 168 hours. No results for input(s): AMMONIA in the last 168 hours. CBC:  Recent Labs Lab 04/06/17 1724  WBC 3.9*  NEUTROABS 1.1*  HGB 12.0  HCT 35.9*  MCV 72.1*  PLT 189   Cardiac Enzymes:  Recent Labs Lab 04/06/17 1724 04/06/17 2158 04/07/17 0050 04/07/17 0336  TROPONINI <0.03 <0.03 <0.03 <0.03   BNP: Invalid input(s): POCBNP CBG: No results for input(s): GLUCAP in the last 168 hours. D-Dimer  Recent Labs  04/06/17 1724  DDIMER 0.84*   Hgb A1c No results for input(s): HGBA1C in the last 72 hours. Lipid Profile No results for input(s): CHOL, HDL, LDLCALC, TRIG, CHOLHDL, LDLDIRECT in the last 72 hours. Thyroid function studies No results for  input(s): TSH, T4TOTAL, T3FREE, THYROIDAB in the last 72 hours.  Invalid input(s): FREET3 Anemia work up No results for input(s): VITAMINB12, FOLATE, FERRITIN, TIBC, IRON, RETICCTPCT in the last 72 hours. Urinalysis No results found for: COLORURINE, APPEARANCEUR, LABSPEC, [place], GLUCOSEU, HGBUR, BILIRUBINUR, KETONESUR, PROTEINUR, UROBILINOGEN, NITRITE, LEUKOCYTESUR Sepsis Labs Invalid input(s): PROCALCITONIN,  WBC,  LACTICIDVEN Microbiology No results found for this or any previous visit (from the past 240 hour(s)).   Time coordinating discharge: <30 minutes  SIGNED:   Louellen Molder, MD  [facility] 04/08/2017, 8:53 AM Pager   If 7PM-7AM, please contact night-coverage [email or web address] Password [email or web address]

## 2017-04-08 NOTE — Care Management Note (Signed)
Case Management Note  Patient Details  Name: Caitlyn Pacheco MRN: [id number] Date of Birth: [birth year]  Subjective/Objective:                 DC to home order in place. No CM consults, needs, or orders identified at this time.   Action/Plan:  DC to home.  Expected Discharge Date:  04/08/17               Expected Discharge Plan:  Home/Self Care  In-House Referral:     Discharge planning Services  CM Consult  Post Acute Care Choice:    Choice offered to:     DME Arranged:    DME Agency:     HH Arranged:    HH Agency:     Status of Service:  Completed, signed off  If discussed at [employer] of Stay Meetings, dates discussed:    Additional Comments:  Carles Collet, RN 04/08/2017, 9:04 AM

## 2017-04-13 DIAGNOSIS — R05 Cough: Secondary | ICD-10-CM | POA: Diagnosis not present

## 2017-04-13 DIAGNOSIS — J069 Acute upper respiratory infection, unspecified: Secondary | ICD-10-CM | POA: Diagnosis not present

## 2017-05-10 DIAGNOSIS — Z959 Presence of cardiac and vascular implant and graft, unspecified: Secondary | ICD-10-CM | POA: Diagnosis not present

## 2017-05-10 DIAGNOSIS — Z452 Encounter for adjustment and management of vascular access device: Secondary | ICD-10-CM | POA: Diagnosis not present

## 2017-05-16 DIAGNOSIS — T8092XA Unspecified transfusion reaction, initial encounter: Secondary | ICD-10-CM | POA: Diagnosis not present

## 2017-05-16 DIAGNOSIS — Z79899 Other long term (current) drug therapy: Secondary | ICD-10-CM | POA: Diagnosis not present

## 2017-05-16 DIAGNOSIS — Z8719 Personal history of other diseases of the digestive system: Secondary | ICD-10-CM | POA: Diagnosis not present

## 2017-06-13 DIAGNOSIS — Z452 Encounter for adjustment and management of vascular access device: Secondary | ICD-10-CM | POA: Diagnosis not present

## 2017-06-13 DIAGNOSIS — Z959 Presence of cardiac and vascular implant and graft, unspecified: Secondary | ICD-10-CM | POA: Diagnosis not present

## 2017-06-13 DIAGNOSIS — Z23 Encounter for immunization: Secondary | ICD-10-CM | POA: Diagnosis not present

## 2017-07-07 IMAGING — CT CT HEAD W/O CM
4 series · 16 of 47 positions shown, 18 images · non-contrast
Comparison: 11/14/2016

CLINICAL DATA: Left-sided weakness

EXAM:
CT HEAD WITHOUT CONTRAST
TECHNIQUE: Contiguous axial images were obtained from the base of the skull
through the vertex without intravenous contrast.

[Series 3: head without · axial · non-contrast · 0.41mm/px · z∈[-198,-78]mm · 7 of 32 slices shown, 9 images]
[im 4/32  brain]
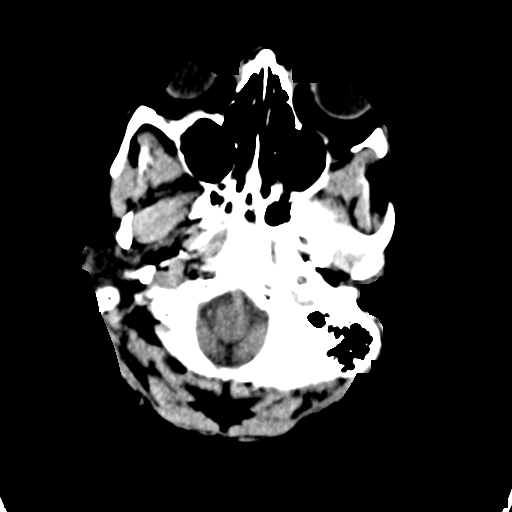
[im 4/32  bone]
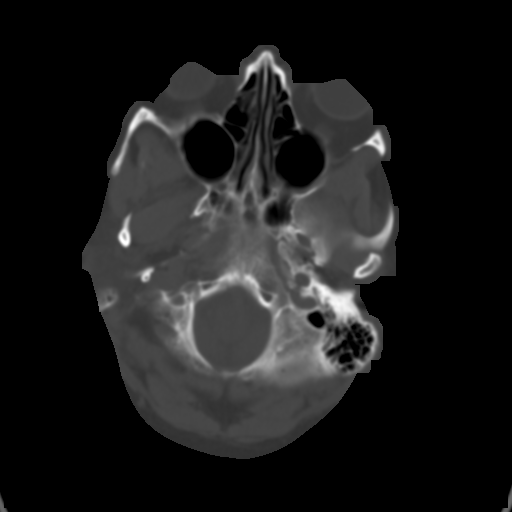
[im 8/32  brain]
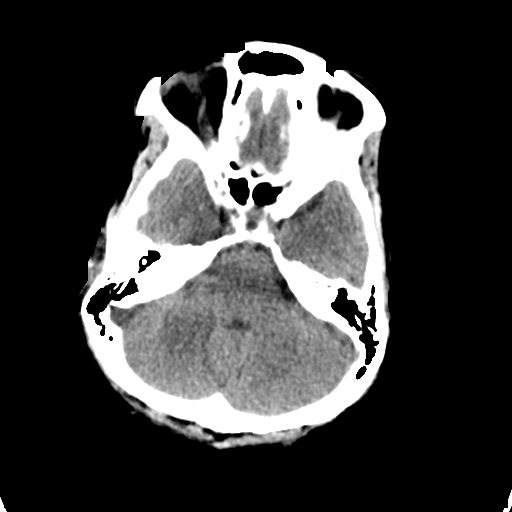
[im 12/32  brain]
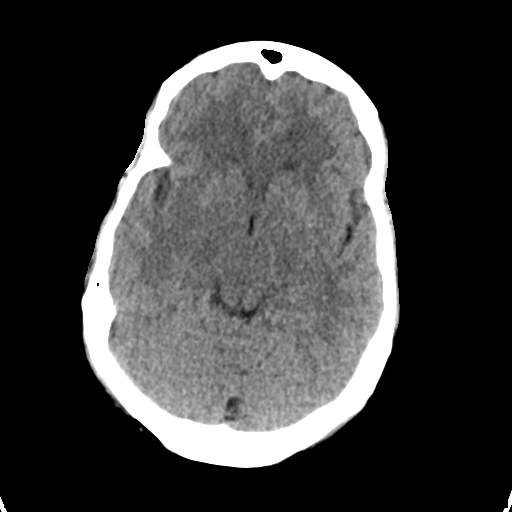
[im 16/32  brain]
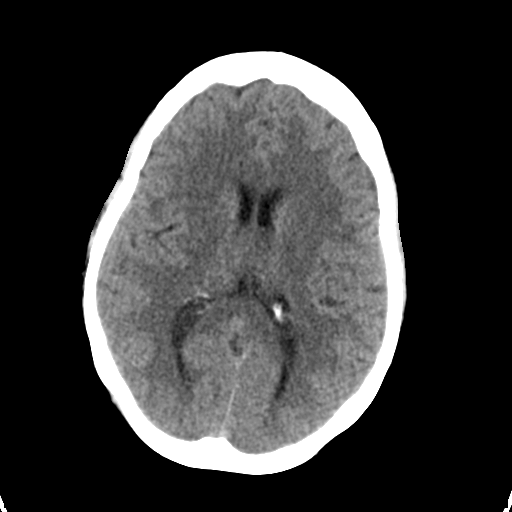
[im 20/32  brain]
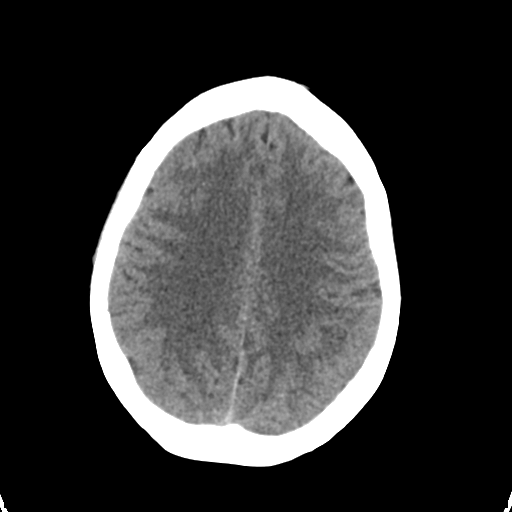
[im 20/32  bone]
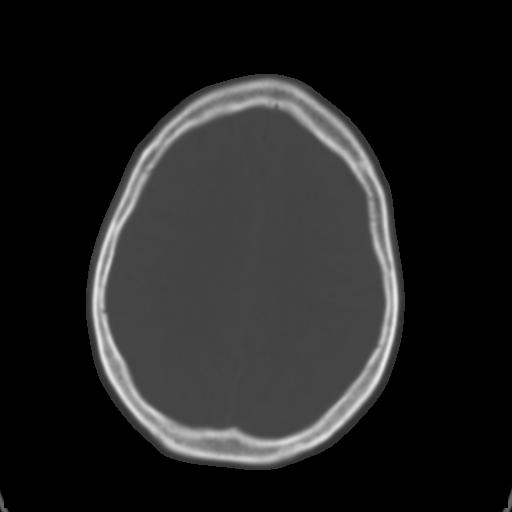
[im 24/32  brain]
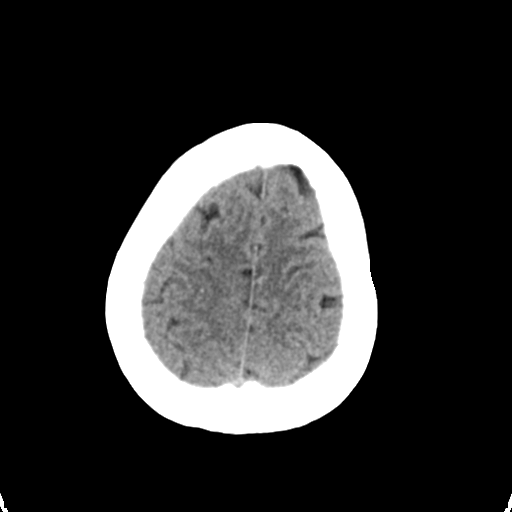
[im 28/32  brain]
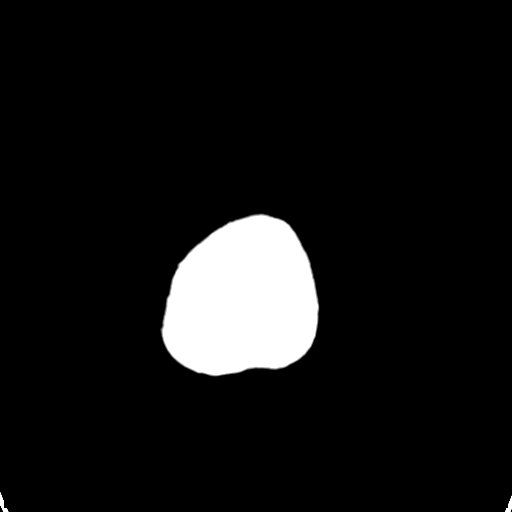

[Series 4: head bone · axial · 0.41mm/px · z∈[-199,-167]mm · 3 of 78 slices shown]
[im 8/78  bone]
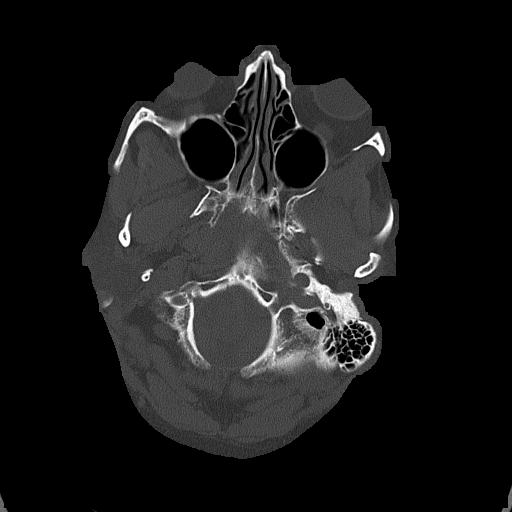
[im 16/78  bone]
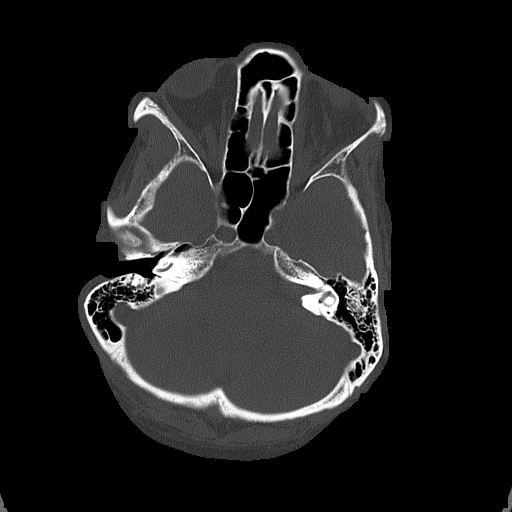
[im 24/78  bone]
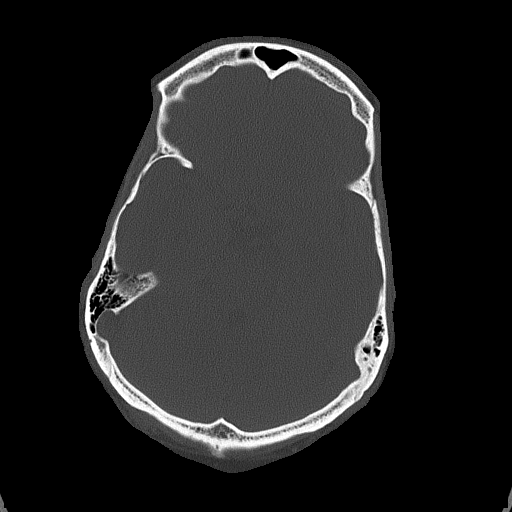

[Series 5: head without cor · coronal · non-contrast · 0.30mm/px · 3 of 66 slices shown]
[im 22/66  brain]
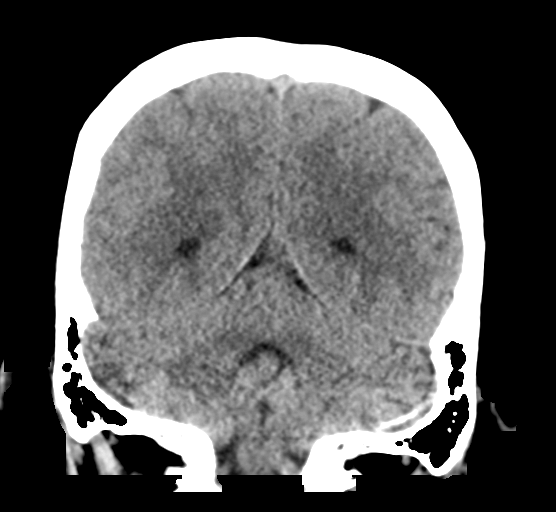
[im 29/66  brain]
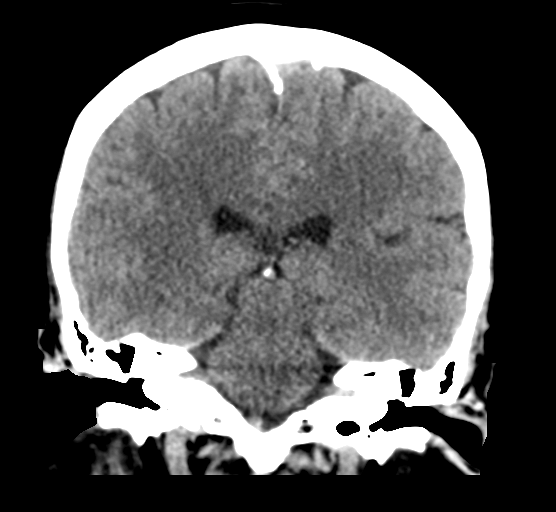
[im 37/66  brain]
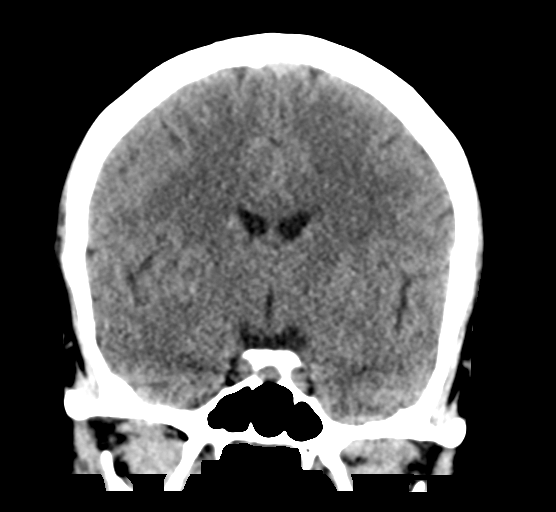

[Series 6: head without sag · sagittal · non-contrast · 0.29mm/px · 3 of 57 slices shown]
[im 19/57  brain]
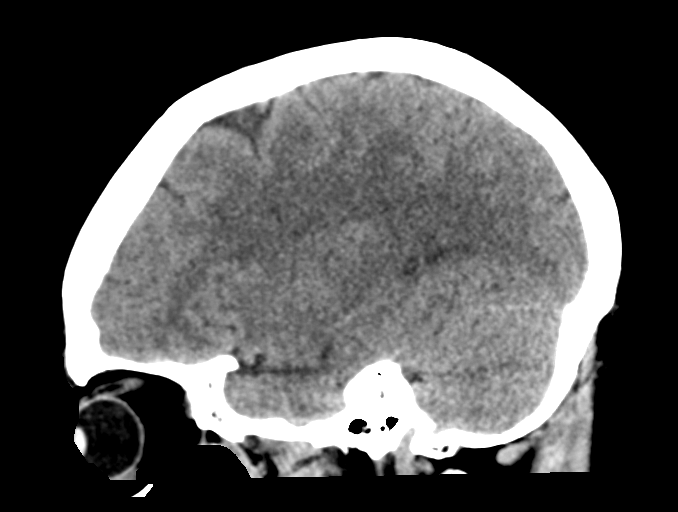
[im 29/57  brain]
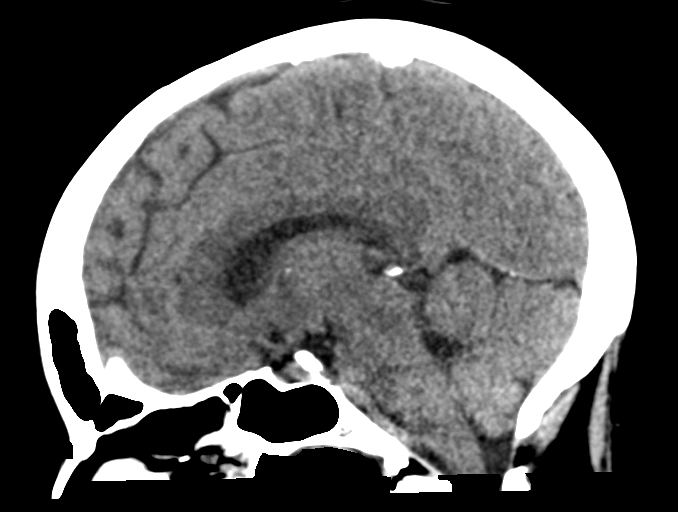
[im 38/57  brain]
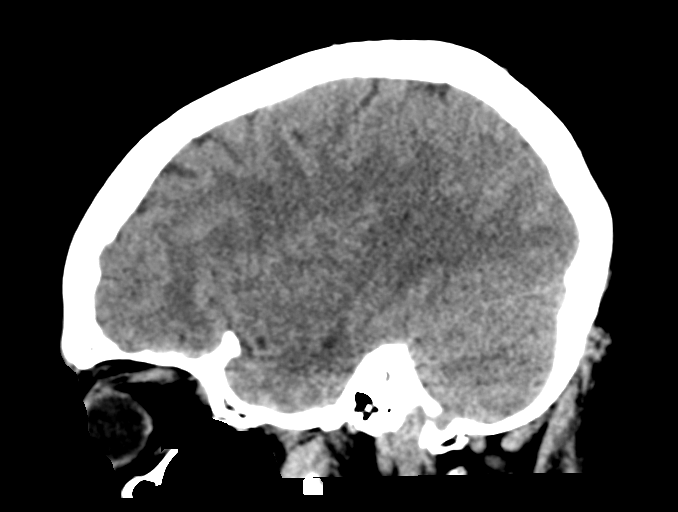

[16 of 47 positions shown; findings below may reference images not displayed]

FINDINGS: Brain: No acute territorial infarction, hemorrhage or intracranial
mass is seen. Old lacunar infarcts in the right basal ganglia. The
ventricles are nonenlarged.

Vascular: No hyperdense vessels.  Carotid artery calcifications.

Skull: Normal. Negative for fracture or focal lesion.

Sinuses/Orbits: No acute finding.

Other: None
IMPRESSION: No CT evidence for acute intracranial abnormality.

## 2017-07-12 DIAGNOSIS — Z23 Encounter for immunization: Secondary | ICD-10-CM | POA: Diagnosis not present

## 2017-07-27 DIAGNOSIS — M25562 Pain in left knee: Secondary | ICD-10-CM | POA: Diagnosis not present

## 2017-07-27 DIAGNOSIS — S4992XA Unspecified injury of left shoulder and upper arm, initial encounter: Secondary | ICD-10-CM | POA: Diagnosis not present

## 2017-07-27 DIAGNOSIS — S40022A Contusion of left upper arm, initial encounter: Secondary | ICD-10-CM | POA: Diagnosis not present

## 2017-07-27 DIAGNOSIS — M25572 Pain in left ankle and joints of left foot: Secondary | ICD-10-CM | POA: Diagnosis not present

## 2017-07-27 DIAGNOSIS — M79622 Pain in left upper arm: Secondary | ICD-10-CM | POA: Diagnosis not present

## 2017-07-27 DIAGNOSIS — S8002XA Contusion of left knee, initial encounter: Secondary | ICD-10-CM | POA: Diagnosis not present

## 2017-07-27 DIAGNOSIS — S9002XA Contusion of left ankle, initial encounter: Secondary | ICD-10-CM | POA: Diagnosis not present

## 2017-07-27 DIAGNOSIS — S99912A Unspecified injury of left ankle, initial encounter: Secondary | ICD-10-CM | POA: Diagnosis not present

## 2017-07-28 DIAGNOSIS — S99912A Unspecified injury of left ankle, initial encounter: Secondary | ICD-10-CM | POA: Diagnosis not present

## 2017-07-28 DIAGNOSIS — M25572 Pain in left ankle and joints of left foot: Secondary | ICD-10-CM | POA: Diagnosis not present

## 2017-07-28 DIAGNOSIS — M25562 Pain in left knee: Secondary | ICD-10-CM | POA: Diagnosis not present

## 2017-07-28 DIAGNOSIS — S4992XA Unspecified injury of left shoulder and upper arm, initial encounter: Secondary | ICD-10-CM | POA: Diagnosis not present

## 2017-07-28 DIAGNOSIS — M79622 Pain in left upper arm: Secondary | ICD-10-CM | POA: Diagnosis not present

## 2017-08-09 DIAGNOSIS — Z959 Presence of cardiac and vascular implant and graft, unspecified: Secondary | ICD-10-CM | POA: Diagnosis not present

## 2017-08-09 DIAGNOSIS — Z452 Encounter for adjustment and management of vascular access device: Secondary | ICD-10-CM | POA: Diagnosis not present

## 2017-10-02 DIAGNOSIS — M5136 Other intervertebral disc degeneration, lumbar region: Secondary | ICD-10-CM | POA: Diagnosis not present

## 2017-10-02 DIAGNOSIS — G894 Chronic pain syndrome: Secondary | ICD-10-CM | POA: Diagnosis not present

## 2017-10-02 DIAGNOSIS — Z8673 Personal history of transient ischemic attack (TIA), and cerebral infarction without residual deficits: Secondary | ICD-10-CM | POA: Diagnosis not present

## 2017-10-17 DIAGNOSIS — Z452 Encounter for adjustment and management of vascular access device: Secondary | ICD-10-CM | POA: Diagnosis not present

## 2017-10-17 DIAGNOSIS — Z8673 Personal history of transient ischemic attack (TIA), and cerebral infarction without residual deficits: Secondary | ICD-10-CM | POA: Diagnosis not present

## 2017-10-17 DIAGNOSIS — M5136 Other intervertebral disc degeneration, lumbar region: Secondary | ICD-10-CM | POA: Diagnosis not present

## 2017-10-19 DIAGNOSIS — M19012 Primary osteoarthritis, left shoulder: Secondary | ICD-10-CM | POA: Diagnosis not present

## 2017-10-23 DIAGNOSIS — M5136 Other intervertebral disc degeneration, lumbar region: Secondary | ICD-10-CM | POA: Diagnosis not present

## 2017-10-23 DIAGNOSIS — Z8673 Personal history of transient ischemic attack (TIA), and cerebral infarction without residual deficits: Secondary | ICD-10-CM | POA: Diagnosis not present

## 2017-10-30 DIAGNOSIS — Z8673 Personal history of transient ischemic attack (TIA), and cerebral infarction without residual deficits: Secondary | ICD-10-CM | POA: Diagnosis not present

## 2017-10-30 DIAGNOSIS — M5136 Other intervertebral disc degeneration, lumbar region: Secondary | ICD-10-CM | POA: Diagnosis not present

## 2017-11-27 DIAGNOSIS — Z8673 Personal history of transient ischemic attack (TIA), and cerebral infarction without residual deficits: Secondary | ICD-10-CM | POA: Diagnosis not present

## 2017-11-27 DIAGNOSIS — M5136 Other intervertebral disc degeneration, lumbar region: Secondary | ICD-10-CM | POA: Diagnosis not present

## 2017-11-28 DIAGNOSIS — Z452 Encounter for adjustment and management of vascular access device: Secondary | ICD-10-CM | POA: Diagnosis not present

## 2017-11-28 DIAGNOSIS — Z959 Presence of cardiac and vascular implant and graft, unspecified: Secondary | ICD-10-CM | POA: Diagnosis not present

## 2017-12-12 DIAGNOSIS — M19012 Primary osteoarthritis, left shoulder: Secondary | ICD-10-CM | POA: Diagnosis not present

## 2017-12-12 DIAGNOSIS — M25512 Pain in left shoulder: Secondary | ICD-10-CM | POA: Diagnosis not present

## 2017-12-13 DIAGNOSIS — Z23 Encounter for immunization: Secondary | ICD-10-CM | POA: Diagnosis not present

## 2017-12-13 DIAGNOSIS — Z79899 Other long term (current) drug therapy: Secondary | ICD-10-CM | POA: Diagnosis not present

## 2017-12-13 DIAGNOSIS — M19012 Primary osteoarthritis, left shoulder: Secondary | ICD-10-CM | POA: Diagnosis not present

## 2017-12-21 DIAGNOSIS — M5136 Other intervertebral disc degeneration, lumbar region: Secondary | ICD-10-CM | POA: Diagnosis not present

## 2017-12-21 DIAGNOSIS — Z8673 Personal history of transient ischemic attack (TIA), and cerebral infarction without residual deficits: Secondary | ICD-10-CM | POA: Diagnosis not present

## 2017-12-30 DIAGNOSIS — Z96619 Presence of unspecified artificial shoulder joint: Secondary | ICD-10-CM | POA: Diagnosis not present

## 2017-12-30 DIAGNOSIS — G4730 Sleep apnea, unspecified: Secondary | ICD-10-CM | POA: Diagnosis not present

## 2017-12-30 DIAGNOSIS — Z5181 Encounter for therapeutic drug level monitoring: Secondary | ICD-10-CM | POA: Diagnosis not present

## 2017-12-30 DIAGNOSIS — Z01818 Encounter for other preprocedural examination: Secondary | ICD-10-CM | POA: Diagnosis not present

## 2017-12-30 DIAGNOSIS — Z7982 Long term (current) use of aspirin: Secondary | ICD-10-CM | POA: Diagnosis not present

## 2017-12-30 DIAGNOSIS — I639 Cerebral infarction, unspecified: Secondary | ICD-10-CM | POA: Diagnosis not present

## 2017-12-30 DIAGNOSIS — J449 Chronic obstructive pulmonary disease, unspecified: Secondary | ICD-10-CM | POA: Diagnosis not present

## 2017-12-30 DIAGNOSIS — Z7902 Long term (current) use of antithrombotics/antiplatelets: Secondary | ICD-10-CM | POA: Diagnosis not present

## 2017-12-30 DIAGNOSIS — M19012 Primary osteoarthritis, left shoulder: Secondary | ICD-10-CM | POA: Diagnosis not present

## 2017-12-30 DIAGNOSIS — E871 Hypo-osmolality and hyponatremia: Secondary | ICD-10-CM | POA: Diagnosis not present

## 2017-12-30 DIAGNOSIS — I10 Essential (primary) hypertension: Secondary | ICD-10-CM | POA: Diagnosis not present

## 2017-12-30 DIAGNOSIS — G92 Toxic encephalopathy: Secondary | ICD-10-CM | POA: Diagnosis not present

## 2017-12-30 DIAGNOSIS — Z8673 Personal history of transient ischemic attack (TIA), and cerebral infarction without residual deficits: Secondary | ICD-10-CM | POA: Diagnosis not present

## 2017-12-30 DIAGNOSIS — J45909 Unspecified asthma, uncomplicated: Secondary | ICD-10-CM | POA: Diagnosis not present

## 2017-12-30 DIAGNOSIS — G8918 Other acute postprocedural pain: Secondary | ICD-10-CM | POA: Diagnosis not present

## 2017-12-30 DIAGNOSIS — Z9889 Other specified postprocedural states: Secondary | ICD-10-CM | POA: Diagnosis not present

## 2017-12-30 DIAGNOSIS — Z8619 Personal history of other infectious and parasitic diseases: Secondary | ICD-10-CM | POA: Diagnosis not present

## 2017-12-30 DIAGNOSIS — Z9119 Patient's noncompliance with other medical treatment and regimen: Secondary | ICD-10-CM | POA: Diagnosis not present

## 2017-12-30 DIAGNOSIS — K219 Gastro-esophageal reflux disease without esophagitis: Secondary | ICD-10-CM | POA: Diagnosis not present

## 2017-12-30 DIAGNOSIS — T40605A Adverse effect of unspecified narcotics, initial encounter: Secondary | ICD-10-CM | POA: Diagnosis not present

## 2017-12-30 DIAGNOSIS — N179 Acute kidney failure, unspecified: Secondary | ICD-10-CM | POA: Diagnosis not present

## 2017-12-30 DIAGNOSIS — G9349 Other encephalopathy: Secondary | ICD-10-CM | POA: Diagnosis not present

## 2018-01-22 DIAGNOSIS — Z8673 Personal history of transient ischemic attack (TIA), and cerebral infarction without residual deficits: Secondary | ICD-10-CM | POA: Diagnosis not present

## 2018-01-22 DIAGNOSIS — M5136 Other intervertebral disc degeneration, lumbar region: Secondary | ICD-10-CM | POA: Diagnosis not present

## 2018-01-29 DIAGNOSIS — Z96612 Presence of left artificial shoulder joint: Secondary | ICD-10-CM | POA: Diagnosis not present

## 2018-01-29 DIAGNOSIS — Z96619 Presence of unspecified artificial shoulder joint: Secondary | ICD-10-CM | POA: Diagnosis not present

## 2018-01-29 DIAGNOSIS — Z471 Aftercare following joint replacement surgery: Secondary | ICD-10-CM | POA: Diagnosis not present

## 2018-02-19 DIAGNOSIS — Z8673 Personal history of transient ischemic attack (TIA), and cerebral infarction without residual deficits: Secondary | ICD-10-CM | POA: Diagnosis not present

## 2018-02-19 DIAGNOSIS — M5136 Other intervertebral disc degeneration, lumbar region: Secondary | ICD-10-CM | POA: Diagnosis not present

## 2018-02-19 DIAGNOSIS — G894 Chronic pain syndrome: Secondary | ICD-10-CM | POA: Diagnosis not present

## 2018-02-27 DIAGNOSIS — M19012 Primary osteoarthritis, left shoulder: Secondary | ICD-10-CM | POA: Diagnosis not present

## 2018-02-27 DIAGNOSIS — M19011 Primary osteoarthritis, right shoulder: Secondary | ICD-10-CM | POA: Diagnosis not present

## 2018-02-27 DIAGNOSIS — M25512 Pain in left shoulder: Secondary | ICD-10-CM | POA: Diagnosis not present

## 2018-02-27 DIAGNOSIS — M25511 Pain in right shoulder: Secondary | ICD-10-CM | POA: Diagnosis not present

## 2018-03-01 DIAGNOSIS — M19011 Primary osteoarthritis, right shoulder: Secondary | ICD-10-CM | POA: Diagnosis not present

## 2018-03-01 DIAGNOSIS — Z96619 Presence of unspecified artificial shoulder joint: Secondary | ICD-10-CM | POA: Diagnosis not present

## 2018-03-01 DIAGNOSIS — M25511 Pain in right shoulder: Secondary | ICD-10-CM | POA: Diagnosis not present

## 2018-03-19 DIAGNOSIS — G894 Chronic pain syndrome: Secondary | ICD-10-CM | POA: Diagnosis not present

## 2018-03-19 DIAGNOSIS — M5136 Other intervertebral disc degeneration, lumbar region: Secondary | ICD-10-CM | POA: Diagnosis not present

## 2018-03-19 DIAGNOSIS — Z8673 Personal history of transient ischemic attack (TIA), and cerebral infarction without residual deficits: Secondary | ICD-10-CM | POA: Diagnosis not present

## 2018-03-26 ENCOUNTER — Encounter: Payer: Self-pay | Admitting: Gastroenterology

## 2018-03-29 DIAGNOSIS — I69952 Hemiplegia and hemiparesis following unspecified cerebrovascular disease affecting left dominant side: Secondary | ICD-10-CM | POA: Diagnosis not present

## 2018-03-29 DIAGNOSIS — K219 Gastro-esophageal reflux disease without esophagitis: Secondary | ICD-10-CM | POA: Diagnosis not present

## 2018-03-29 DIAGNOSIS — R1011 Right upper quadrant pain: Secondary | ICD-10-CM | POA: Diagnosis not present

## 2018-03-29 DIAGNOSIS — R109 Unspecified abdominal pain: Secondary | ICD-10-CM | POA: Diagnosis not present

## 2018-03-29 DIAGNOSIS — J441 Chronic obstructive pulmonary disease with (acute) exacerbation: Secondary | ICD-10-CM | POA: Diagnosis not present

## 2018-03-29 DIAGNOSIS — I2510 Atherosclerotic heart disease of native coronary artery without angina pectoris: Secondary | ICD-10-CM | POA: Diagnosis not present

## 2018-03-29 DIAGNOSIS — R079 Chest pain, unspecified: Secondary | ICD-10-CM | POA: Diagnosis not present

## 2018-03-29 DIAGNOSIS — I4891 Unspecified atrial fibrillation: Secondary | ICD-10-CM | POA: Diagnosis not present

## 2018-03-31 DIAGNOSIS — Z96619 Presence of unspecified artificial shoulder joint: Secondary | ICD-10-CM | POA: Diagnosis not present

## 2018-04-16 DIAGNOSIS — M179 Osteoarthritis of knee, unspecified: Secondary | ICD-10-CM | POA: Diagnosis not present

## 2018-04-24 ENCOUNTER — Ambulatory Visit: Payer: Medicare Other | Admitting: Gastroenterology

## 2018-04-30 ENCOUNTER — Encounter: Payer: Self-pay | Admitting: Gastroenterology

## 2018-05-01 DIAGNOSIS — Z96619 Presence of unspecified artificial shoulder joint: Secondary | ICD-10-CM | POA: Diagnosis not present

## 2018-05-10 ENCOUNTER — Other Ambulatory Visit: Payer: Self-pay

## 2018-05-10 NOTE — Patient Outreach (Signed)
[facility]) Care Management  05/10/2018  Caitlyn Pacheco 01-Jul-1956 [id number]   Referral Date: 05/10/18 Referral Source: EMMI Campaign Referral Reason: Patient engagement score   Outreach Attempt #1 Telephone call to patient.  She is able to verify HIPAA.  Discussed with patient reason for referral.  Patient able to verify that primary care physician is [facility] in [place].  Advised patient that her primary physician is not part of the [facility] network.  Therefore making her ineligible for [facility] Services. Patient verbalized understanding and states that if she changes her physician she will call.    Plan: RN CM will update PCP information and close case.    Jone Baseman, RN, MSN [facility] Care Management Care Management Coordinator Direct Line [phone] Toll Free: [phone]  Fax: [phone]

## 2018-05-14 DIAGNOSIS — M19019 Primary osteoarthritis, unspecified shoulder: Secondary | ICD-10-CM | POA: Diagnosis not present

## 2018-05-31 DIAGNOSIS — Z96619 Presence of unspecified artificial shoulder joint: Secondary | ICD-10-CM | POA: Diagnosis not present

## 2018-06-10 DIAGNOSIS — K219 Gastro-esophageal reflux disease without esophagitis: Secondary | ICD-10-CM

## 2018-06-10 DIAGNOSIS — R0789 Other chest pain: Secondary | ICD-10-CM | POA: Diagnosis not present

## 2018-06-10 DIAGNOSIS — R748 Abnormal levels of other serum enzymes: Secondary | ICD-10-CM

## 2018-06-10 DIAGNOSIS — I2510 Atherosclerotic heart disease of native coronary artery without angina pectoris: Secondary | ICD-10-CM

## 2018-06-10 DIAGNOSIS — I509 Heart failure, unspecified: Secondary | ICD-10-CM | POA: Diagnosis not present

## 2018-06-10 DIAGNOSIS — R079 Chest pain, unspecified: Secondary | ICD-10-CM

## 2018-06-10 DIAGNOSIS — R109 Unspecified abdominal pain: Secondary | ICD-10-CM

## 2018-06-10 DIAGNOSIS — R0602 Shortness of breath: Secondary | ICD-10-CM | POA: Diagnosis not present

## 2018-06-10 DIAGNOSIS — R1031 Right lower quadrant pain: Secondary | ICD-10-CM | POA: Diagnosis not present

## 2018-06-10 DIAGNOSIS — E785 Hyperlipidemia, unspecified: Secondary | ICD-10-CM

## 2018-06-11 DIAGNOSIS — I872 Venous insufficiency (chronic) (peripheral): Secondary | ICD-10-CM | POA: Diagnosis not present

## 2018-06-11 DIAGNOSIS — R079 Chest pain, unspecified: Secondary | ICD-10-CM | POA: Diagnosis not present

## 2018-06-11 DIAGNOSIS — R109 Unspecified abdominal pain: Secondary | ICD-10-CM | POA: Diagnosis not present

## 2018-06-11 DIAGNOSIS — R748 Abnormal levels of other serum enzymes: Secondary | ICD-10-CM | POA: Diagnosis not present

## 2018-06-11 DIAGNOSIS — I2510 Atherosclerotic heart disease of native coronary artery without angina pectoris: Secondary | ICD-10-CM | POA: Diagnosis not present

## 2018-06-11 DIAGNOSIS — R600 Localized edema: Secondary | ICD-10-CM | POA: Diagnosis not present

## 2018-06-11 DIAGNOSIS — I509 Heart failure, unspecified: Secondary | ICD-10-CM | POA: Diagnosis not present

## 2018-06-12 DIAGNOSIS — M1990 Unspecified osteoarthritis, unspecified site: Secondary | ICD-10-CM | POA: Diagnosis not present

## 2018-06-12 DIAGNOSIS — R079 Chest pain, unspecified: Secondary | ICD-10-CM | POA: Diagnosis not present

## 2018-06-12 DIAGNOSIS — M069 Rheumatoid arthritis, unspecified: Secondary | ICD-10-CM | POA: Diagnosis not present

## 2018-06-12 DIAGNOSIS — I110 Hypertensive heart disease with heart failure: Secondary | ICD-10-CM | POA: Diagnosis not present

## 2018-06-12 DIAGNOSIS — I2510 Atherosclerotic heart disease of native coronary artery without angina pectoris: Secondary | ICD-10-CM | POA: Diagnosis not present

## 2018-06-12 DIAGNOSIS — E039 Hypothyroidism, unspecified: Secondary | ICD-10-CM | POA: Diagnosis not present

## 2018-06-12 DIAGNOSIS — I4891 Unspecified atrial fibrillation: Secondary | ICD-10-CM | POA: Diagnosis not present

## 2018-06-12 DIAGNOSIS — R748 Abnormal levels of other serum enzymes: Secondary | ICD-10-CM | POA: Diagnosis not present

## 2018-06-12 DIAGNOSIS — E876 Hypokalemia: Secondary | ICD-10-CM | POA: Diagnosis not present

## 2018-06-12 DIAGNOSIS — Z885 Allergy status to narcotic agent status: Secondary | ICD-10-CM | POA: Diagnosis not present

## 2018-06-12 DIAGNOSIS — J449 Chronic obstructive pulmonary disease, unspecified: Secondary | ICD-10-CM | POA: Diagnosis not present

## 2018-06-12 DIAGNOSIS — I69954 Hemiplegia and hemiparesis following unspecified cerebrovascular disease affecting left non-dominant side: Secondary | ICD-10-CM | POA: Diagnosis not present

## 2018-06-12 DIAGNOSIS — I509 Heart failure, unspecified: Secondary | ICD-10-CM | POA: Diagnosis not present

## 2018-06-12 DIAGNOSIS — Z23 Encounter for immunization: Secondary | ICD-10-CM | POA: Diagnosis not present

## 2018-06-12 DIAGNOSIS — E860 Dehydration: Secondary | ICD-10-CM | POA: Diagnosis not present

## 2018-06-12 DIAGNOSIS — R197 Diarrhea, unspecified: Secondary | ICD-10-CM | POA: Diagnosis not present

## 2018-06-12 DIAGNOSIS — Z888 Allergy status to other drugs, medicaments and biological substances status: Secondary | ICD-10-CM | POA: Diagnosis not present

## 2018-06-12 DIAGNOSIS — R109 Unspecified abdominal pain: Secondary | ICD-10-CM | POA: Diagnosis not present

## 2018-06-12 DIAGNOSIS — K219 Gastro-esophageal reflux disease without esophagitis: Secondary | ICD-10-CM | POA: Diagnosis not present

## 2018-06-12 DIAGNOSIS — M7989 Other specified soft tissue disorders: Secondary | ICD-10-CM | POA: Diagnosis not present

## 2018-06-12 DIAGNOSIS — R1031 Right lower quadrant pain: Secondary | ICD-10-CM | POA: Diagnosis not present

## 2018-06-12 DIAGNOSIS — N179 Acute kidney failure, unspecified: Secondary | ICD-10-CM | POA: Diagnosis not present

## 2018-06-12 DIAGNOSIS — R001 Bradycardia, unspecified: Secondary | ICD-10-CM | POA: Diagnosis not present

## 2018-06-12 DIAGNOSIS — Z87440 Personal history of urinary (tract) infections: Secondary | ICD-10-CM | POA: Diagnosis not present

## 2018-06-12 DIAGNOSIS — R7989 Other specified abnormal findings of blood chemistry: Secondary | ICD-10-CM | POA: Diagnosis not present

## 2018-06-12 DIAGNOSIS — R112 Nausea with vomiting, unspecified: Secondary | ICD-10-CM | POA: Diagnosis not present

## 2018-06-18 DIAGNOSIS — I10 Essential (primary) hypertension: Secondary | ICD-10-CM | POA: Diagnosis not present

## 2018-06-27 ENCOUNTER — Encounter: Payer: Self-pay | Admitting: Gastroenterology

## 2018-07-09 ENCOUNTER — Other Ambulatory Visit: Payer: Self-pay

## 2018-07-09 NOTE — Patient Outreach (Signed)
Caitlyn Pacheco [facility]) Care Management  07/09/2018  Caitlyn Pacheco 09-Jan-1956 [id number]   Medication Adherence call to Mrs. Caitlyn Pacheco left a message for patient to call back patient is due on Losartan 50 mg.  Mrs. Caitlyn Pacheco is showing past due under [place],   [place] Management Direct Dial [phone]  Fax [phone] [email or web address]

## 2018-07-11 ENCOUNTER — Encounter: Payer: Self-pay | Admitting: Gastroenterology

## 2018-07-11 ENCOUNTER — Ambulatory Visit (INDEPENDENT_AMBULATORY_CARE_PROVIDER_SITE_OTHER): Payer: Medicare Other | Admitting: Gastroenterology

## 2018-07-11 VITALS — BP 144/98 | HR 76 | Wt 186.2 lb

## 2018-07-11 DIAGNOSIS — K219 Gastro-esophageal reflux disease without esophagitis: Secondary | ICD-10-CM

## 2018-07-11 DIAGNOSIS — B182 Chronic viral hepatitis C: Secondary | ICD-10-CM | POA: Diagnosis not present

## 2018-07-11 DIAGNOSIS — R109 Unspecified abdominal pain: Secondary | ICD-10-CM

## 2018-07-11 DIAGNOSIS — G8929 Other chronic pain: Secondary | ICD-10-CM

## 2018-07-11 MED ORDER — PANTOPRAZOLE SODIUM 40 MG PO TBEC: 40.0000 mg | DELAYED_RELEASE_TABLET | Freq: Every day | ORAL | 11 refills | Status: DC

## 2018-07-11 NOTE — Progress Notes (Signed)
IMPRESSION and PLAN:    #1. Chronic abdominal pain (Adm to [facility] 06/2018) -likely musculoskeletal, rule out other causes. Neg colonoscopy 12/26/2014 except for minimal sigmoid diverticulosis - Please obtain previous records CT done 3 weeks ago at [facility]. - Patient is status post recent left shoulder replacement Feb 2019.  She is to follow-up in the GI clinic in 6 months.  At follow-up, once she is cleared by Ortho, she would need repeat colonoscopy since she has positive family history of colon cancer in a first-degree relative (father at age 2).       #2.  Chronic Hepaitis C without hepatic coma.  No liver cirrhosis (patient attributes to blood transfusion in 1980s, no IV drug use; 05/2007 ultra quant 18,900, gentype Ia, seen by Dr. Berneice Heinrich at [facility] but patient refused to follow-up.  May 2018 HCV 3 mil) S/P vaccines for hepatitis A and B. Plan: - [place] Elastography - CBC, CMP, Hep C viral load and genotype.  - ID ref thereafter for hepatitis C treatment.  #3. GERD - Continue Protonix 40 mg p.o. once a day for now. #4. H/O SBO (status post X-Lap in [place] March 2017, February 2012)      HPI:    Chief Complaint:   Caitlyn SQUITIERI is a 62 y.o. female  S/P shoulder replacement L at [facility] Dec 30, 2017. Still cannot lie in the left lateral position.  Has follow-up appointment with Ortho. Had acute on chronic abdominal pain requiring admission to [facility] 3 weeks ago. Underwent CT scan of the abdomen and pelvis-we do not have any records. She denies having any significant nausea, vomiting, heartburn, regurgitation odynophagia or dysphagia.  She has chronic constipation which is better with MiraLAX.  She is highly motivated to get hepatitis C treated at this time.  She had undergone vaccines for hepatitis A&B.  She is aware that we no longer treat hepatitis C and have to refer to ID for that.   Past Medical History:  Diagnosis Date  . CHF (congestive heart failure) ([place])    . Chronic pain syndrome   . HCV (hepatitis C virus)   . Hypertension   . Osteoarthritis   . Stroke [facility] 1996    Current Outpatient Medications  Medication Sig Dispense Refill  . albuterol (ACCUNEB) 1.25 MG/3ML nebulizer solution Take 3 mLs by nebulization 3 (three) times daily as needed for wheezing or shortness of breath.     Marland Kitchen albuterol (PROAIR HFA) 108 (90 Base) MCG/ACT inhaler Inhale 2 puffs into the lungs every 6 (six) hours as needed for wheezing or shortness of breath.    Marland Kitchen amLODipine (NORVASC) 10 MG tablet Take 10 mg by mouth daily.    Marland Kitchen aspirin 81 MG chewable tablet Chew 81 mg by mouth daily.    . budesonide-formoterol (SYMBICORT) 160-4.5 MCG/ACT inhaler Inhale 2 puffs into the lungs 2 (two) times daily.    . carvedilol (COREG) 25 MG tablet Take 0.5 tablets (12.5 mg total) by mouth 2 (two) times daily. 30 tablet 0  . clopidogrel (PLAVIX) 75 MG tablet Take 75 mg by mouth daily.    . fluticasone (FLONASE ALLERGY RELIEF) 50 MCG/ACT nasal spray Place 2 sprays into both nostrils daily as needed for allergies or rhinitis.    . Fluticasone-Salmeterol (ADVAIR DISKUS) 250-50 MCG/DOSE AEPB Inhale 1 puff into the lungs 2 (two) times daily.    . furosemide (LASIX) 40 MG tablet Take 0.5 tablets (20 mg total) by mouth daily.  30 tablet 0  . losartan (COZAAR) 25 MG tablet Take 25 mg by mouth daily.    Marland Kitchen lubiprostone (AMITIZA) 24 MCG capsule Take 24 mcg by mouth 2 (two) times daily with a meal.    . naloxone (NARCAN) nasal spray 4 mg/0.1 mL 1 spray See admin instructions. INTO 1 NOSTRIL AND MAY REPEAT EVERY 2-3 MINUTES UNTIL EMERGENCY ARRIVES    . Oxycodone HCl 10 MG TABS Take 1 tablet (10 mg total) by mouth every 6 (six) hours as needed. 10 tablet 0  . pregabalin (LYRICA) 150 MG capsule Take 150 mg by mouth 3 (three) times daily.     No current facility-administered medications for this visit.     Past Surgical History:  Procedure Laterality Date  . ABDOMINAL HYSTERECTOMY    . ABDOMINAL  SURGERY    . CHOLECYSTECTOMY    . COLONOSCOPY  12/26/2014   Very minimal sigmoid diverticulosis. Small internal hemorrhoids.   . ESOPHAGOGASTRODUODENOSCOPY  01/21/2011   Normal EGD.   Marland Kitchen THYROID SURGERY      History reviewed. No pertinent family history.  Social History   Tobacco Use  . Smoking status: Current Every Day Smoker    Packs/day: 0.50    Types: Cigarettes  . Smokeless tobacco: Never Used  . Tobacco comment: pt rpeorts using about 6 per day  Substance Use Topics  . Alcohol use: No  . Drug use: No    Allergies  Allergen Reactions  . Ibuprofen     Per her doctor  . Morphine And Related Other (See Comments)    Per her doctor  . Nsaids     Per her doctor  . Prednisone Other (See Comments)    Per her doctor  . Statins Other (See Comments)    Affected liver enzymes     Review of Systems: All systems reviewed and negative except where noted in HPI.    Physical Exam:     BP (!) 144/98   Pulse 76   Wt 186 lb 4 oz (84.5 kg)   BMI 30.06 kg/m  @WEIGHTLAST3 @ GENERAL:  Alert, oriented, cooperative, not in acute distress. PSYCH: :Pleasant, normal mood and affect. HEENT:  conjunctiva pink, mucous membranes moist, neck supple without masses. No jaundice. CARDIAC:  S1 S2 normal. No murmers. PULM: Normal respiratory effort, lungs CTA bilaterally, no wheezing. ABDOMEN: Inspection: No visible peristalsis, no abnormal pulsations, skin normal.  Palpation/percussion: Soft, nontender, nondistended, no rigidity, no abnormal dullness to percussion, no hepatosplenomegaly and no palpable abdominal masses.  Auscultation: Normal bowel sounds, no abdominal bruits. Rectal exam: Deferred SKIN:  turgor, no lesions seen. Musculoskeletal:  Normal muscle tone, normal strength. NEURO: Alert and oriented x 3, no focal neurologic deficits.     Dellia Donnelly,MD 07/11/2018, 2:33 PM   CC Kimel-Scott, Santiago Glad, MD

## 2018-07-11 NOTE — Patient Instructions (Signed)
If you are age 60 or older, your body mass index should be between 23-30. Your Body mass index is 30.06 kg/m. If this is out of the aforementioned range listed, please consider follow up with your Primary Care Provider.  If you are age 58 or younger, your body mass index should be between 19-25. Your Body mass index is 30.06 kg/m. If this is out of the aformentioned range listed, please consider follow up with your Primary Care Provider.   Please go to the lab on the 2nd floor [address] before you leave the office today.    You have been scheduled for an abdominal ultrasound at [facility] (1st floor of hospital) on 07/13/18 at 10:30am. Please arrive 15 minutes prior to your appointment for registration. Make certain not to have anything to eat or drink 6 hours prior to your appointment. Should you need to reschedule your appointment, please contact radiology at [phone]. This test typically takes about 30 minutes to perform.   We have sent the following medications to your pharmacy for you to pick up at your convenience: Protonix 40 mg once daily.   Thank you,  Dr. Jackquline Denmark

## 2018-07-11 NOTE — Addendum Note (Signed)
Addended by: Karena Addison on: 07/11/2018 04:43 PM   Modules accepted: Orders

## 2018-07-13 ENCOUNTER — Ambulatory Visit (HOSPITAL_COMMUNITY)
Admission: RE | Admit: 2018-07-13 | Discharge: 2018-07-13 | Disposition: A | Discharge status: Home / Self Care | Payer: Medicare Other | Source: Ambulatory Visit | Attending: Gastroenterology | Admitting: Gastroenterology

## 2018-07-13 DIAGNOSIS — G8929 Other chronic pain: Secondary | ICD-10-CM | POA: Diagnosis not present

## 2018-07-13 DIAGNOSIS — R109 Unspecified abdominal pain: Secondary | ICD-10-CM | POA: Diagnosis not present

## 2018-07-13 DIAGNOSIS — B182 Chronic viral hepatitis C: Secondary | ICD-10-CM | POA: Diagnosis not present

## 2018-07-13 DIAGNOSIS — K219 Gastro-esophageal reflux disease without esophagitis: Secondary | ICD-10-CM | POA: Diagnosis not present

## 2018-07-16 ENCOUNTER — Telehealth: Payer: Self-pay | Admitting: Gastroenterology

## 2018-07-16 ENCOUNTER — Other Ambulatory Visit: Payer: Self-pay

## 2018-07-16 DIAGNOSIS — I10 Essential (primary) hypertension: Secondary | ICD-10-CM | POA: Diagnosis not present

## 2018-07-16 DIAGNOSIS — B1920 Unspecified viral hepatitis C without hepatic coma: Secondary | ICD-10-CM

## 2018-07-16 NOTE — Telephone Encounter (Signed)
Also let her son know that referral to ID was made.  They should be reaching out to her to schedule an appointment.

## 2018-08-01 DIAGNOSIS — Z96619 Presence of unspecified artificial shoulder joint: Secondary | ICD-10-CM | POA: Diagnosis not present

## 2018-08-07 DIAGNOSIS — I6389 Other cerebral infarction: Secondary | ICD-10-CM | POA: Diagnosis not present

## 2018-08-07 DIAGNOSIS — R51 Headache: Secondary | ICD-10-CM | POA: Diagnosis not present

## 2018-08-08 DIAGNOSIS — I6389 Other cerebral infarction: Secondary | ICD-10-CM | POA: Diagnosis not present

## 2018-08-13 ENCOUNTER — Telehealth: Payer: Self-pay | Admitting: Gastroenterology

## 2018-08-13 DIAGNOSIS — I10 Essential (primary) hypertension: Secondary | ICD-10-CM | POA: Diagnosis not present

## 2018-08-13 NOTE — Telephone Encounter (Signed)
Pt needs to be seen in an OV first, please schedule her for an appt.

## 2018-08-14 ENCOUNTER — Ambulatory Visit (INDEPENDENT_AMBULATORY_CARE_PROVIDER_SITE_OTHER): Payer: Medicare Other | Admitting: Infectious Diseases

## 2018-08-14 ENCOUNTER — Encounter: Payer: Self-pay | Admitting: Infectious Diseases

## 2018-08-14 VITALS — BP 144/84 | HR 83 | Temp 98.2°F | Wt 190.0 lb

## 2018-08-14 DIAGNOSIS — B1920 Unspecified viral hepatitis C without hepatic coma: Secondary | ICD-10-CM | POA: Insufficient documentation

## 2018-08-14 DIAGNOSIS — B182 Chronic viral hepatitis C: Secondary | ICD-10-CM | POA: Diagnosis not present

## 2018-08-14 DIAGNOSIS — Z87898 Personal history of other specified conditions: Secondary | ICD-10-CM

## 2018-08-14 NOTE — Patient Instructions (Addendum)
We have an appointment for your blood work tomorrow Sept 25th at 10:00 at the [place] at [facility] Please arrive by 9:45 am to check in with Admissions (2nd floor in the main hospital. You can park in the [employer]).   Once we have these labs we can get started on your hepatitis c treatment. There is a one pill once a day regimen I would like to use for you for 12 weeks for treatment. Please do not take protonix during this time period.   Will have you back in 4 weeks with our pharmacy team to check in after starting medications.

## 2018-08-14 NOTE — Progress Notes (Signed)
Patient Name: Caitlyn Pacheco  Date of Birth: [birth year]  MRN: [id number]  PCP: Harvie Junior, MD  Referring Provider: Dr. Cheree Ditto   Patient Active Problem List   Diagnosis Date Noted  . Hepatitis C 08/14/2018  . History of difficult venous access 08/14/2018  . Chronic depression 04/08/2017  . Hypokalemia 04/08/2017  . HTN (hypertension) 04/06/2017  . Chronic pain syndrome 04/06/2017    SUBJECTIVE: Chief Complaint  Patient presents with  . New Patient (Initial Visit)    Hep c treatment    HPI/ROS:  Caitlyn Pacheco is a 62 y.o. female who presents for evaluation and management of chronic hepatitis C infection. Patient tested positive first in 2008 per chart review (VL 18,900, 1a) and again in May 2018 VL 3 million). She was previously working with Dr. Berneice Heinrich at [facility] however did not follow up and has never been treated before. Hepatitis C-associated risk factors present are: history of blood transfusion (details: 1985). Patient denies accidental needle stick, intranasal drug use, IV drug abuse, multiple sexual partners, tattoos. She has had US/Elastography 07/13/18 revealed heterogeneous liver with mildly increased echotexture, no focal lesions, patent portal vein and F0/1 Metavir fibrosis score.   She has had 5 abdominal surgeries over the course of years for "locked bowel" and adhesions, which is the reason this has been put off. Her most recent one was March 2016. She has learned many dietary triggers that she knows to avoid. She struggles with chronic constipation (on Linzess). She is very interested in curing this infection as she has been found to have fatty liver infiltration and wants to minimize further damage to her liver.   HIV 4th generation screen non-reactive 03/2017.   Patient does not have any documented immunity to Hepatitis A. Patient does not have documented immunity to Hepatitis B.    Constitutional: negative for fevers, chills, malaise and anorexia Eyes: negative  for icterus Respiratory: negative for cough or dyspnea on exertion Cardiovascular: negative for chest pain, dyspnea, orthopnea, lower extremity edema Gastrointestinal: negative except for melena, abdominal pain and chronic constipation Genitourinary: negative for dysuria, hematuria and dark urine  Hematologic/lymphatic: negative for easy bruising and +hemocult stool. On Plavix  Musculoskeletal: negative for arthralgias Neurological: negative for headaches, memory problems, coordination problems, gait problems and tremor Behavioral/Psych: negative Endocrine: negative All other systems reviewed and are negative      Past Medical History:  Diagnosis Date  . CHF (congestive heart failure) ([place])   . Chronic pain syndrome   . HCV (hepatitis C virus)   . Hypertension   . Osteoarthritis   . Stroke [facility]) 1996   Outpatient Medications Prior to Visit  Medication Sig Dispense Refill  . albuterol (ACCUNEB) 1.25 MG/3ML nebulizer solution Take 3 mLs by nebulization 3 (three) times daily as needed for wheezing or shortness of breath.     Marland Kitchen albuterol (PROAIR HFA) 108 (90 Base) MCG/ACT inhaler Inhale 2 puffs into the lungs every 6 (six) hours as needed for wheezing or shortness of breath.    Marland Kitchen aspirin 81 MG chewable tablet Chew 81 mg by mouth daily.    . budesonide-formoterol (SYMBICORT) 160-4.5 MCG/ACT inhaler Inhale 2 puffs into the lungs 2 (two) times daily.    . clopidogrel (PLAVIX) 75 MG tablet Take 75 mg by mouth daily.    . fluticasone (FLONASE ALLERGY RELIEF) 50 MCG/ACT nasal spray Place 2 sprays into both nostrils daily as needed for allergies or rhinitis.    . Fluticasone-Salmeterol (ADVAIR DISKUS) 250-50 MCG/DOSE  AEPB Inhale 1 puff into the lungs 2 (two) times daily.    Marland Kitchen lubiprostone (AMITIZA) 24 MCG capsule Take 24 mcg by mouth 2 (two) times daily with a meal.    . naloxone (NARCAN) nasal spray 4 mg/0.1 mL 1 spray See admin instructions. INTO 1 NOSTRIL AND MAY REPEAT EVERY 2-3 MINUTES UNTIL  EMERGENCY ARRIVES    . Olmesartan-amLODIPine-HCTZ 40-10-25 MG TABS Take 1 tablet by mouth daily.    . Oxycodone HCl 10 MG TABS Take 1 tablet (10 mg total) by mouth every 6 (six) hours as needed. 10 tablet 0  . pantoprazole (PROTONIX) 40 MG tablet Take 1 tablet (40 mg total) by mouth daily. 30 tablet 11  . pregabalin (LYRICA) 150 MG capsule Take 200 mg by mouth 3 (three) times daily.     Marland Kitchen amLODipine (NORVASC) 10 MG tablet Take 10 mg by mouth daily.    . carvedilol (COREG) 25 MG tablet Take 0.5 tablets (12.5 mg total) by mouth 2 (two) times daily. (Patient not taking: Reported on 08/14/2018) 30 tablet 0  . furosemide (LASIX) 40 MG tablet Take 0.5 tablets (20 mg total) by mouth daily. (Patient not taking: Reported on 08/14/2018) 30 tablet 0  . losartan (COZAAR) 25 MG tablet Take 25 mg by mouth daily.     No facility-administered medications prior to visit.     Allergies  Allergen Reactions  . Morphine And Related Other (See Comments), Hives and Itching    Per her doctor Per her doctor  . Acetaminophen Other (See Comments)    Liver enlargement- hep C  . Ibuprofen     Per her doctor  . Nsaids     Per her doctor  . Prednisone Other (See Comments)    Per her doctor  . Statins Other (See Comments)    Affected liver enzymes Affected liver enzymes    Social History   Tobacco Use  . Smoking status: Current Every Day Smoker    Packs/day: 0.50    Types: Cigarettes  . Smokeless tobacco: Never Used  . Tobacco comment: pt rpeorts using about 6 per day  Substance Use Topics  . Alcohol use: No  . Drug use: No    Family History  Problem Relation Age of Onset  . Hepatitis C Sister     Objective:   Vitals:   08/14/18 0918  BP: (!) 144/84  Pulse: 83  Temp: 98.2 F (36.8 C)   Constitutional: in no apparent distress, well developed and well nourished and anicteric Eyes: anicteric Cardiovascular: Cor RRR and No murmurs Respiratory: clear Gastrointestinal: Bowel sounds are normal,  liver is not enlarged, spleen is not enlarged Musculoskeletal: peripheral pulses normal, no pedal edema, no clubbing or cyanosis Skin: negative for - jaundice, spider hemangioma, telangiectasia, palmar erythema, ecchymosis and atrophy; no porphyria cutanea tarda Lymphatic: no cervical lymphadenopathy   Laboratory: Genotype: No results found for: HCVGENOTYPE HCV viral load: No results found for: HCVQUANT Lab Results  Component Value Date   WBC 3.9 (L) 04/06/2017   HGB 12.0 04/06/2017   HCT 35.9 (L) 04/06/2017   MCV 72.1 (L) 04/06/2017   PLT 189 04/06/2017    Lab Results  Component Value Date   CREATININE 0.60 04/06/2017   BUN 6 04/06/2017   NA 140 04/06/2017   K 3.3 (L) 04/06/2017   CL 106 04/06/2017   CO2 26 04/06/2017   No results found for: ALT, AST, GGT, ALKPHOS  No results found for: INR, BILITOT, ALBUMIN  Labs and history  reviewed and show CHILD-PUGH A 5-6 points: Child class A 7-9 points: Child class B 10-15 points: Child class C  Imaging:  Assessment & Plan:   Problem List Items Addressed This Visit      Digestive   Hepatitis C - Primary    New Patient with Chronic Hepatitis C genotype 1a, treatment naive. F0/1 per recent elastography.   I discussed with the patient the lab findings that confirm chronic hepatitis C as well as the natural history and progression of disease including about 30% of people who develop cirrhosis of the liver if left untreated and once cirrhosis is established there is a 2-7% risk per year of liver cancer and liver failure.  I discussed the importance of treatment and benefits in reducing the risk, even if significant liver fibrosis exists. I also discussed risk for re-infection following treatment should he not continue to modify risk factors.    Patient counseled extensively on limiting acetaminophen to no more than 2 grams daily, avoidance of alcohol.  Transmission discussed with patient including sexual transmission, sharing razors  and toothbrush.   Will need referral to gastroenterology if concern for cirrhosis - Ultrasound w/o concern for cirrhosis. F0/1 with some fatty infiltration  Will prescribe appropriate medication based on genotype and coverage - will treat with Epclusa x 12 weeks once PA approved. She will need to stop her protonix during treatment which is fine with her considering this is only an as needed medication.    Hepatitis A and B titers to be drawn today with appropriate vaccinations as needed   Pneumovax vaccine at upcoming visit if not previously given           Other   History of difficult venous access    She has a chest port that is in place and reports very bad veins. I have called Medical Day at [facility] to set up an appointment 10:00 tomorrow for port access and lab draws. They are aware of the interval of labs to be drawn and short term need for this service.  Will set up appt for 4 week VL in this manner as well through calling [phone] and faxing orders to [phone].          I spent 45 minutes with the patient including greater than 70% of time in face to face counsel of the patient re hepatitis c and the details described above and in coordination of their care.  Janene Madeira, MSN, NP-C [facility] [email or web address] Pager: [phone] Office: [phone]

## 2018-08-14 NOTE — Progress Notes (Signed)
HPI: Caitlyn Pacheco is a 62 y.o. female who presents to the [facility] to initiate care for her chronic Hepatitis C infection.   Patient Active Problem List   Diagnosis Date Noted  . Chronic depression 04/08/2017  . Hypokalemia 04/08/2017  . HTN (hypertension) 04/06/2017  . Chronic pain syndrome 04/06/2017    Patient's Medications  New Prescriptions   No medications on file  Previous Medications   ALBUTEROL (ACCUNEB) 1.25 MG/3ML NEBULIZER SOLUTION    Take 3 mLs by nebulization 3 (three) times daily as needed for wheezing or shortness of breath.    ALBUTEROL (PROAIR HFA) 108 (90 BASE) MCG/ACT INHALER    Inhale 2 puffs into the lungs every 6 (six) hours as needed for wheezing or shortness of breath.   AMLODIPINE (NORVASC) 10 MG TABLET    Take 10 mg by mouth daily.   ASPIRIN 81 MG CHEWABLE TABLET    Chew 81 mg by mouth daily.   BUDESONIDE-FORMOTEROL (SYMBICORT) 160-4.5 MCG/ACT INHALER    Inhale 2 puffs into the lungs 2 (two) times daily.   CARVEDILOL (COREG) 25 MG TABLET    Take 0.5 tablets (12.5 mg total) by mouth 2 (two) times daily.   CLOPIDOGREL (PLAVIX) 75 MG TABLET    Take 75 mg by mouth daily.   FLUTICASONE (FLONASE ALLERGY RELIEF) 50 MCG/ACT NASAL SPRAY    Place 2 sprays into both nostrils daily as needed for allergies or rhinitis.   FLUTICASONE-SALMETEROL (ADVAIR DISKUS) 250-50 MCG/DOSE AEPB    Inhale 1 puff into the lungs 2 (two) times daily.   FUROSEMIDE (LASIX) 40 MG TABLET    Take 0.5 tablets (20 mg total) by mouth daily.   LOSARTAN (COZAAR) 25 MG TABLET    Take 25 mg by mouth daily.   LUBIPROSTONE (AMITIZA) 24 MCG CAPSULE    Take 24 mcg by mouth 2 (two) times daily with a meal.   NALOXONE (NARCAN) NASAL SPRAY 4 MG/0.1 ML    1 spray See admin instructions. INTO 1 NOSTRIL AND MAY REPEAT EVERY 2-3 MINUTES UNTIL EMERGENCY ARRIVES   OLMESARTAN-AMLODIPINE-HCTZ 40-10-25 MG TABS    Take 1 tablet by mouth daily.   OXYCODONE HCL 10 MG TABS    Take 1 tablet (10 mg total) by mouth  every 6 (six) hours as needed.   PANTOPRAZOLE (PROTONIX) 40 MG TABLET    Take 1 tablet (40 mg total) by mouth daily.   PREGABALIN (LYRICA) 150 MG CAPSULE    Take 200 mg by mouth 3 (three) times daily.   Modified Medications   No medications on file  Discontinued Medications   No medications on file    Allergies: Allergies  Allergen Reactions  . Morphine And Related Other (See Comments), Hives and Itching    Per her doctor Per her doctor  . Acetaminophen Other (See Comments)    Liver enlargement- hep C  . Ibuprofen     Per her doctor  . Nsaids     Per her doctor  . Prednisone Other (See Comments)    Per her doctor  . Statins Other (See Comments)    Affected liver enzymes Affected liver enzymes    Past Medical History: Past Medical History:  Diagnosis Date  . CHF (congestive heart failure) ([place])   . Chronic pain syndrome   . HCV (hepatitis C virus)   . Hypertension   . Osteoarthritis   . Stroke [facility]) 1996    Social History: Social History   Socioeconomic History  .  Marital status: Single    Spouse name: Not on file  . Number of children: Not on file  . Years of education: Not on file  . Highest education level: Not on file  Occupational History  . Not on file  Social Needs  . Financial resource strain: Not on file  . Food insecurity:    Worry: Not on file    Inability: Not on file  . Transportation needs:    Medical: Not on file    Non-medical: Not on file  Tobacco Use  . Smoking status: Current Every Day Smoker    Packs/day: 0.50    Types: Cigarettes  . Smokeless tobacco: Never Used  . Tobacco comment: pt rpeorts using about 6 per day  Substance and Sexual Activity  . Alcohol use: No  . Drug use: No  . Sexual activity: Never  Lifestyle  . Physical activity:    Days per week: Not on file    Minutes per session: Not on file  . Stress: Not on file  Relationships  . Social connections:    Talks on phone: Not on file    Gets together: Not on file      Attends religious service: Not on file    Active member of club or organization: Not on file    Attends meetings of clubs or organizations: Not on file    Relationship status: Not on file  Other Topics Concern  . Not on file  Social History Narrative  . Not on file    Labs: Hepatitis C No results found for: HCVGENOTYPE, HEPCAB, HCVRNAPCRQN, FIBROSTAGE Hepatitis B No results found for: HEPBSAB, HEPBSAG, HEPBCAB Hepatitis A No results found for: HAV HIV Lab Results  Component Value Date   HIV Non Reactive 04/06/2017   Lab Results  Component Value Date   CREATININE 0.60 04/06/2017   CREATININE 0.60 04/06/2017   No results found for: AST, ALT, INR  Assessment: Caitlyn Pacheco is here today to see Caitlyn Pacheco and initiate care for her chronic Hep C infection.  She already had her elastography done and her results were F0/F1. Caitlyn Pacheco will get all other baseline labs today and proceed with treatment (likely Epclusa). I discussed the process with the patient and told her we would work on getting the medication approved through her insurance and call her when that is completed. Will counsel her once medication approved.   Plan: - Labs and then file for Hep C medication  Caitlyn Pacheco, PharmD, [place], [place] [facility] 08/14/2018, 11:12 AM

## 2018-08-14 NOTE — Assessment & Plan Note (Addendum)
New Patient with Chronic Hepatitis C genotype 1a, treatment naive. F0/1 per recent elastography.   I discussed with the patient the lab findings that confirm chronic hepatitis C as well as the natural history and progression of disease including about 30% of people who develop cirrhosis of the liver if left untreated and once cirrhosis is established there is a 2-7% risk per year of liver cancer and liver failure.  I discussed the importance of treatment and benefits in reducing the risk, even if significant liver fibrosis exists. I also discussed risk for re-infection following treatment should he not continue to modify risk factors.    Patient counseled extensively on limiting acetaminophen to no more than 2 grams daily, avoidance of alcohol.  Transmission discussed with patient including sexual transmission, sharing razors and toothbrush.   Will need referral to gastroenterology if concern for cirrhosis - Ultrasound w/o concern for cirrhosis. F0/1 with some fatty infiltration  Will prescribe appropriate medication based on genotype and coverage - will treat with Epclusa x 12 weeks once PA approved. She will need to stop her protonix during treatment which is fine with her considering this is only an as needed medication.    Hepatitis A and B titers to be drawn today with appropriate vaccinations as needed   Pneumovax vaccine at upcoming visit if not previously given

## 2018-08-14 NOTE — Assessment & Plan Note (Signed)
She has a chest port that is in place and reports very bad veins. I have called Medical Day at [facility] to set up an appointment 10:00 tomorrow for port access and lab draws. They are aware of the interval of labs to be drawn and short term need for this service.  Will set up appt for 4 week VL in this manner as well through calling [phone] and faxing orders to [phone].

## 2018-08-15 ENCOUNTER — Ambulatory Visit (HOSPITAL_COMMUNITY)
Admission: RE | Admit: 2018-08-15 | Discharge: 2018-08-15 | Disposition: A | Discharge status: Home / Self Care | Payer: Medicare Other | Source: Ambulatory Visit | Attending: Infectious Diseases | Admitting: Infectious Diseases

## 2018-08-15 DIAGNOSIS — B182 Chronic viral hepatitis C: Secondary | ICD-10-CM | POA: Insufficient documentation

## 2018-08-15 DIAGNOSIS — I10 Essential (primary) hypertension: Secondary | ICD-10-CM | POA: Diagnosis not present

## 2018-08-15 LAB — CBC
HCT: 42.2 % (ref 36.0–46.0)
Hemoglobin: 13.4 g/dL (ref 12.0–15.0)
MCH: 23.8 pg — AB (ref 26.0–34.0)
MCHC: 31.8 g/dL (ref 30.0–36.0)
MCV: 75 fL — AB (ref 78.0–100.0)
Platelets: 204 10*3/uL (ref 150–400)
RBC: 5.63 MIL/uL — ABNORMAL HIGH (ref 3.87–5.11)
RDW: 15 % (ref 11.5–15.5)
WBC: 4.7 10*3/uL (ref 4.0–10.5)

## 2018-08-15 LAB — COMPREHENSIVE METABOLIC PANEL
ALT: 51 U/L — ABNORMAL HIGH (ref 0–44)
ANION GAP: 10 (ref 5–15)
AST: 48 U/L — ABNORMAL HIGH (ref 15–41)
Albumin: 3.5 g/dL (ref 3.5–5.0)
Alkaline Phosphatase: 111 U/L (ref 38–126)
BUN: 11 mg/dL (ref 6–20)
CHLORIDE: 101 mmol/L (ref 98–111)
CO2: 27 mmol/L (ref 22–32)
Calcium: 8.7 mg/dL — ABNORMAL LOW (ref 8.9–10.3)
Creatinine, Ser: 0.88 mg/dL (ref 0.44–1.00)
Glucose, Bld: 106 mg/dL — ABNORMAL HIGH (ref 70–99)
Potassium: 3.6 mmol/L (ref 3.5–5.1)
Sodium: 138 mmol/L (ref 135–145)
Total Bilirubin: 0.7 mg/dL (ref 0.3–1.2)
Total Protein: 6.8 g/dL (ref 6.5–8.1)

## 2018-08-15 LAB — PROTIME-INR
INR: 1.03
Prothrombin Time: 13.4 seconds (ref 11.4–15.2)

## 2018-08-15 MED ORDER — HEPARIN SOD (PORK) LOCK FLUSH 100 UNIT/ML IV SOLN
500.0000 [IU] | INTRAVENOUS | Status: AC | PRN
  Administered 2018-08-15: 500 [IU]

## 2018-08-15 MED ORDER — HEPARIN SOD (PORK) LOCK FLUSH 100 UNIT/ML IV SOLN
INTRAVENOUS | Status: AC
  Administered 2018-08-15: 500 [IU]
  Filled 2018-08-15: qty 5

## 2018-08-16 DIAGNOSIS — M19011 Primary osteoarthritis, right shoulder: Secondary | ICD-10-CM | POA: Diagnosis not present

## 2018-08-16 DIAGNOSIS — Z471 Aftercare following joint replacement surgery: Secondary | ICD-10-CM | POA: Diagnosis not present

## 2018-08-16 DIAGNOSIS — Z96612 Presence of left artificial shoulder joint: Secondary | ICD-10-CM | POA: Diagnosis not present

## 2018-08-16 LAB — HEPATITIS B CORE ANTIBODY, TOTAL: HEP B C TOTAL AB: NEGATIVE

## 2018-08-16 LAB — HEPATITIS B SURFACE ANTIBODY,QUALITATIVE: Hep B S Ab: NONREACTIVE

## 2018-08-16 LAB — HEPATITIS A ANTIBODY, TOTAL: HEP A TOTAL AB: POSITIVE — AB

## 2018-08-16 LAB — HCV RNA QUANT
HCV Quantitative Log: 6.688 log10 IU/mL (ref >1.70)
HCV Quantitative: 4880000 IU/mL (ref >50)

## 2018-08-16 LAB — HEPATITIS B SURFACE ANTIGEN: HEP B S AG: NEGATIVE

## 2018-08-18 LAB — HEPATITIS C GENOTYPE

## 2018-08-20 ENCOUNTER — Other Ambulatory Visit: Payer: Self-pay | Admitting: Pharmacist

## 2018-08-20 DIAGNOSIS — B182 Chronic viral hepatitis C: Secondary | ICD-10-CM

## 2018-08-20 MED ORDER — SOFOSBUVIR-VELPATASVIR 400-100 MG PO TABS: 1.0000 | ORAL_TABLET | Freq: Every day | ORAL | 2 refills | Status: DC

## 2018-08-20 NOTE — Progress Notes (Signed)
Sounds good. I'll send it to [place] will work her magic.  Will see her in 4 weeks and may need your help with the port. Thanks!

## 2018-08-20 NOTE — Progress Notes (Signed)
Epclusa x 12 weeks for chronic Hepatitis C. Will counsel and follow-up with patient once she is approved.

## 2018-08-20 NOTE — Progress Notes (Signed)
1a genotype - will proceed with Epclusa x 12 weeks for treatment of chronic hep c infection. Please schedule follow up in 4 weeks. Had to make arrangements for labs to be drawn through her port at [facility]. I can help arrange this if you need and ensure orders are entered correctly in the phase of care needed so they can see them.

## 2018-08-21 ENCOUNTER — Encounter: Payer: Self-pay | Admitting: Pharmacy Technician

## 2018-08-21 ENCOUNTER — Telehealth: Payer: Self-pay | Admitting: Pharmacist

## 2018-08-21 NOTE — Progress Notes (Signed)
Thank you :)

## 2018-08-21 NOTE — Telephone Encounter (Signed)
Patient is approved for Epclusa x 12 weeks.  Counseled her on how to take the medication and the importance of not missing any doses.  Discussed that she will be on this for 3 months total.  Counseled on possible side effects such as headaches, nausea, and fatigue.  She will call with any questions.  She will follow-up with me on 10/29.  Will coordinate with [facility] [place] for lab draws.

## 2018-08-22 MED FILL — SOFOSBUVIR-VELPATASVIR 400-: 400-100 | 28 days supply | Qty: 28 | Fill #0

## 2018-08-23 ENCOUNTER — Encounter: Payer: Self-pay | Admitting: Gastroenterology

## 2018-08-23 ENCOUNTER — Ambulatory Visit (INDEPENDENT_AMBULATORY_CARE_PROVIDER_SITE_OTHER): Payer: Medicare Other | Admitting: Gastroenterology

## 2018-08-23 VITALS — BP 128/76 | HR 74 | Ht 66.5 in | Wt 190.0 lb

## 2018-08-23 DIAGNOSIS — K219 Gastro-esophageal reflux disease without esophagitis: Secondary | ICD-10-CM | POA: Diagnosis not present

## 2018-08-23 DIAGNOSIS — B182 Chronic viral hepatitis C: Secondary | ICD-10-CM | POA: Diagnosis not present

## 2018-08-23 DIAGNOSIS — R195 Other fecal abnormalities: Secondary | ICD-10-CM | POA: Diagnosis not present

## 2018-08-23 MED ORDER — SUPREP BOWEL PREP KIT 17.5-3.13-1.6 GM/177ML PO SOLN: 1.0000 | ORAL | 0 refills | Status: DC

## 2018-08-23 NOTE — Progress Notes (Signed)
IMPRESSION and PLAN:   #1. Heme positive stools with occasional rectal bleeding, normal CBC  07/2018.  Patient with chronic abdominal pain (Adm to [facility] 06/2018) -likely musculoskeletal, rule out other causes. Neg colonoscopy 12/26/2014. Has family history of colon cancer in a first-degree relative (father) - Proceed with colonoscopy with 2-day prep.  I have discussed the risks and benefits.  The risks including risk of perforation requiring laparotomy, bleeding after polypectomy requiring blood transfusions and risks of anesthesia/sedation were discussed.  Rare risks of missing colorectal neoplasms were also discussed.  Alternatives were given.  Patient is fully aware and agrees to proceed. All the questions were answered. Colonoscopy will be scheduled in upcoming days.  Patient is to report immediately if there is any significant weight loss or excessive bleeding until then. Consent forms were given for review.        #2.  Chronic Hepaitis C without hepatic coma.  No liver cirrhosis (patient attributes to blood transfusion in 1980s, no IV drug use; 05/2007 ultra quant 18,900, gentype Ia, seen by Dr. Patsy Pacheco at [facility] but patient refused to follow-up.  May 2018 HCV 3 mil, 1a) S/P vaccines for hepatitis A and B. Hepatic elastography 2019- F0/F1 Plan: -Starting treatment with Epclusa next week x 12 weeks.  #3. GERD - Continue Protonix 40 mg p.o. once a day for now. #4. H/O SBO (status post X-Lap in [place] March 2017, February 2012)  HPI:    Chief Complaint:   Caitlyn Pacheco is a 62 y.o. female  For follow-up visit Had heme positive stools with occasional rectal bleeding, normal CBC except for low MCV Also has family history of colon cancer in a first-degree relative-advised get repeat colonoscopy Denies having any other significant GI complaints Constipation under good control with MiraLAX every day Has been complaining about gaining weight No nausea or vomiting Starting  hepatitis C treatment next week. Caitlyn Pacheco has been approved. On review her echo on 03/2017 ejection fraction 65 to 70%   Past Medical History:  Diagnosis Date  . CHF (congestive heart failure) ([place])   . Chronic pain syndrome   . HCV (hepatitis C virus)   . Hypertension   . Osteoarthritis   . Stroke [facility]) 1996    Current Outpatient Medications  Medication Sig Dispense Refill  . albuterol (ACCUNEB) 1.25 MG/3ML nebulizer solution Take 3 mLs by nebulization 3 (three) times daily as needed for wheezing or shortness of breath.     Marland Kitchen albuterol (PROAIR HFA) 108 (90 Base) MCG/ACT inhaler Inhale 2 puffs into the lungs every 6 (six) hours as needed for wheezing or shortness of breath.    Marland Kitchen aspirin 81 MG chewable tablet Chew 81 mg by mouth daily.    . budesonide-formoterol (SYMBICORT) 160-4.5 MCG/ACT inhaler Inhale 2 puffs into the lungs 2 (two) times daily.    . clopidogrel (PLAVIX) 75 MG tablet Take 75 mg by mouth daily.    . fluticasone (FLONASE ALLERGY RELIEF) 50 MCG/ACT nasal spray Place 2 sprays into both nostrils daily as needed for allergies or rhinitis.    . Fluticasone-Salmeterol (ADVAIR DISKUS) 250-50 MCG/DOSE AEPB Inhale 1 puff into the lungs 2 (two) times daily.    . furosemide (LASIX) 40 MG tablet Take 0.5 tablets (20 mg total) by mouth daily. 30 tablet 0  . lubiprostone (AMITIZA) 24 MCG capsule Take 24 mcg by mouth 2 (two) times daily with a meal.    . naloxone (NARCAN) nasal spray 4 mg/0.1 mL 1  spray See admin instructions. INTO 1 NOSTRIL AND MAY REPEAT EVERY 2-3 MINUTES UNTIL EMERGENCY ARRIVES    . Olmesartan-amLODIPine-HCTZ 40-10-25 MG TABS Take 1 tablet by mouth daily.    . Oxycodone HCl 10 MG TABS Take 1 tablet (10 mg total) by mouth every 6 (six) hours as needed. 10 tablet 0  . pantoprazole (PROTONIX) 40 MG tablet Take 1 tablet (40 mg total) by mouth daily. 30 tablet 11  . pregabalin (LYRICA) 150 MG capsule Take 200 mg by mouth 3 (three) times daily.     . Sofosbuvir-Velpatasvir  (EPCLUSA) 400-100 MG TABS Take 1 tablet by mouth daily. 28 tablet 2   No current facility-administered medications for this visit.     Past Surgical History:  Procedure Laterality Date  . ABDOMINAL HYSTERECTOMY    . ABDOMINAL SURGERY    . CHOLECYSTECTOMY    . COLONOSCOPY  12/26/2014   Very minimal sigmoid diverticulosis. Small internal hemorrhoids.   . ESOPHAGOGASTRODUODENOSCOPY  01/21/2011   Normal EGD.   Marland Kitchen THYROID SURGERY      Family History  Problem Relation Age of Onset  . Hepatitis C Sister     Social History   Tobacco Use  . Smoking status: Current Every Day Smoker    Packs/day: 0.50    Types: Cigarettes  . Smokeless tobacco: Never Used  . Tobacco comment: pt rpeorts using about 6 per day  Substance Use Topics  . Alcohol use: No  . Drug use: No    Allergies  Allergen Reactions  . Morphine And Related Other (See Comments), Hives and Itching    Per her doctor Per her doctor  . Acetaminophen Other (See Comments)    Liver enlargement- hep C  . Ibuprofen     Per her doctor  . Nsaids     Per her doctor  . Prednisone Other (See Comments)    Per her doctor  . Statins Other (See Comments)    Affected liver enzymes Affected liver enzymes     Review of Systems: All systems reviewed and negative except where noted in HPI.    Physical Exam:     BP 128/76   Pulse 74   Ht 5' 6.5" (1.689 m)   Wt 190 lb (86.2 kg)   BMI 30.21 kg/m  _0 @ GENERAL:  Alert, oriented, cooperative, not in acute distress. PSYCH: :Pleasant, normal mood and affect. HEENT:  conjunctiva pink, mucous membranes moist, neck supple without masses. No jaundice. CARDIAC:  S1 S2 normal. No murmers. PULM: Normal respiratory effort, lungs CTA bilaterally, no wheezing. ABDOMEN: Inspection: No visible peristalsis, no abnormal pulsations, skin normal.  Well-healed surgical scars.  Palpation/percussion: Soft, nontender, nondistended, no rigidity, no abnormal dullness to percussion, no  hepatosplenomegaly and no palpable abdominal masses.  Auscultation: Normal bowel sounds, no abdominal bruits. Rectal exam: Deferred SKIN:  turgor, no lesions seen. Musculoskeletal:  Normal muscle tone, normal strength. NEURO: Alert and oriented x 3, no focal neurologic deficits.   Data Reviewed: I have personally reviewed following labs and imaging studies CBC Latest Ref Rng & Units 08/15/2018 04/06/2017  WBC 4.0 - 10.5 K/uL 4.7 3.9(L)  Hemoglobin 12.0 - 15.0 g/dL 13.4 12.0  Hematocrit 36.0 - 46.0 % 42.2 35.9(L)  Platelets 150 - 400 K/uL 204 189   Hepatic Function Latest Ref Rng & Units 08/15/2018  Total Protein 6.5 - 8.1 g/dL 6.8  Albumin 3.5 - 5.0 g/dL 3.5  AST 15 - 41 U/L 48(H)  ALT 0 - 44 U/L 51(H)  Alk  Phosphatase 38 - 126 U/L 111  Total Bilirubin 0.3 - 1.2 mg/dL 0.7  25 minutes spent with the patient today. Greater than 50% was spent in counseling and coordination of care with the patient    Seydou Hearns,MD 08/23/2018, 9:07 AM   CC Harvie Junior, MD

## 2018-08-23 NOTE — Patient Instructions (Addendum)
If you are age 60 or older, your body mass index should be between 23-30. Your Body mass index is 30.21 kg/m. If this is out of the aforementioned range listed, please consider follow up with your Primary Care Provider.  If you are age 94 or younger, your body mass index should be between 19-25. Your Body mass index is 30.21 kg/m. If this is out of the aformentioned range listed, please consider follow up with your Primary Care Provider.   You will be contacted by our office prior to your procedure for directions on holding your Plavix.  If you do not hear from our office 1 week prior to your scheduled procedure, please call [phone] to discuss.   You have been scheduled for a colonoscopy. Please follow written instructions given to you at your visit today.  Please pick up your prep supplies at the pharmacy within the next 1-3 days. If you use inhalers (even only as needed), please bring them with you on the day of your procedure. Your physician has requested that you go to www.startemmi.com and enter the access code given to you at your visit today. This web site gives a general overview about your procedure. However, you should still follow specific instructions given to you by our office regarding your preparation for the procedure.  We have sent the following medications to your pharmacy for you to pick up at your convenience: Suprep  Thank you,  Dr. Jackquline Denmark

## 2018-08-24 DIAGNOSIS — S43431A Superior glenoid labrum lesion of right shoulder, initial encounter: Secondary | ICD-10-CM | POA: Diagnosis not present

## 2018-08-24 DIAGNOSIS — M19011 Primary osteoarthritis, right shoulder: Secondary | ICD-10-CM | POA: Diagnosis not present

## 2018-08-24 DIAGNOSIS — S46211A Strain of muscle, fascia and tendon of other parts of biceps, right arm, initial encounter: Secondary | ICD-10-CM | POA: Diagnosis not present

## 2018-08-24 DIAGNOSIS — M25411 Effusion, right shoulder: Secondary | ICD-10-CM | POA: Diagnosis not present

## 2018-08-24 DIAGNOSIS — M7581 Other shoulder lesions, right shoulder: Secondary | ICD-10-CM | POA: Diagnosis not present

## 2018-08-31 DIAGNOSIS — Z96619 Presence of unspecified artificial shoulder joint: Secondary | ICD-10-CM | POA: Diagnosis not present

## 2018-09-01 DIAGNOSIS — Z96611 Presence of right artificial shoulder joint: Secondary | ICD-10-CM | POA: Diagnosis not present

## 2018-09-01 DIAGNOSIS — Z8673 Personal history of transient ischemic attack (TIA), and cerebral infarction without residual deficits: Secondary | ICD-10-CM | POA: Diagnosis not present

## 2018-09-01 DIAGNOSIS — Z471 Aftercare following joint replacement surgery: Secondary | ICD-10-CM | POA: Diagnosis not present

## 2018-09-01 DIAGNOSIS — K219 Gastro-esophageal reflux disease without esophagitis: Secondary | ICD-10-CM | POA: Diagnosis not present

## 2018-09-01 DIAGNOSIS — Z7902 Long term (current) use of antithrombotics/antiplatelets: Secondary | ICD-10-CM | POA: Diagnosis not present

## 2018-09-01 DIAGNOSIS — J449 Chronic obstructive pulmonary disease, unspecified: Secondary | ICD-10-CM | POA: Diagnosis not present

## 2018-09-01 DIAGNOSIS — G8918 Other acute postprocedural pain: Secondary | ICD-10-CM | POA: Diagnosis not present

## 2018-09-01 DIAGNOSIS — M19011 Primary osteoarthritis, right shoulder: Secondary | ICD-10-CM | POA: Diagnosis not present

## 2018-09-01 DIAGNOSIS — G4730 Sleep apnea, unspecified: Secondary | ICD-10-CM | POA: Diagnosis not present

## 2018-09-01 DIAGNOSIS — Z7982 Long term (current) use of aspirin: Secondary | ICD-10-CM | POA: Diagnosis not present

## 2018-09-01 DIAGNOSIS — I10 Essential (primary) hypertension: Secondary | ICD-10-CM | POA: Diagnosis not present

## 2018-09-01 HISTORY — PX: TOTAL SHOULDER REPLACEMENT: SUR1217

## 2018-09-04 NOTE — Telephone Encounter (Signed)
Per Dr. Jimmye Norman patient is to hold Plavix 5 days before procedure.

## 2018-09-10 DIAGNOSIS — I10 Essential (primary) hypertension: Secondary | ICD-10-CM | POA: Diagnosis not present

## 2018-09-11 ENCOUNTER — Ambulatory Visit: Payer: Medicare Other

## 2018-09-17 MED FILL — SOFOSBUVIR-VELPATASVIR 400-: 400-100 | 28 days supply | Qty: 28 | Fill #1

## 2018-09-18 ENCOUNTER — Other Ambulatory Visit: Payer: Self-pay | Admitting: Infectious Diseases

## 2018-09-18 ENCOUNTER — Ambulatory Visit: Payer: Medicare Other

## 2018-09-18 DIAGNOSIS — B182 Chronic viral hepatitis C: Secondary | ICD-10-CM

## 2018-09-19 DIAGNOSIS — Z96611 Presence of right artificial shoulder joint: Secondary | ICD-10-CM | POA: Diagnosis not present

## 2018-09-19 DIAGNOSIS — Z471 Aftercare following joint replacement surgery: Secondary | ICD-10-CM | POA: Diagnosis not present

## 2018-09-20 DIAGNOSIS — M79621 Pain in right upper arm: Secondary | ICD-10-CM | POA: Diagnosis not present

## 2018-09-20 DIAGNOSIS — E860 Dehydration: Secondary | ICD-10-CM | POA: Diagnosis not present

## 2018-09-20 DIAGNOSIS — R531 Weakness: Secondary | ICD-10-CM | POA: Diagnosis not present

## 2018-09-20 DIAGNOSIS — K5900 Constipation, unspecified: Secondary | ICD-10-CM | POA: Diagnosis not present

## 2018-09-20 DIAGNOSIS — R7989 Other specified abnormal findings of blood chemistry: Secondary | ICD-10-CM | POA: Diagnosis not present

## 2018-09-20 DIAGNOSIS — R0602 Shortness of breath: Secondary | ICD-10-CM | POA: Diagnosis not present

## 2018-09-20 DIAGNOSIS — Z79891 Long term (current) use of opiate analgesic: Secondary | ICD-10-CM | POA: Diagnosis not present

## 2018-09-20 DIAGNOSIS — Z7982 Long term (current) use of aspirin: Secondary | ICD-10-CM | POA: Diagnosis not present

## 2018-09-20 DIAGNOSIS — M7989 Other specified soft tissue disorders: Secondary | ICD-10-CM | POA: Diagnosis not present

## 2018-09-20 DIAGNOSIS — Z96611 Presence of right artificial shoulder joint: Secondary | ICD-10-CM | POA: Diagnosis not present

## 2018-09-25 ENCOUNTER — Other Ambulatory Visit (HOSPITAL_COMMUNITY): Payer: Self-pay | Admitting: *Deleted

## 2018-09-26 ENCOUNTER — Ambulatory Visit: Payer: Medicare Other

## 2018-09-26 ENCOUNTER — Inpatient Hospital Stay (HOSPITAL_COMMUNITY): Admission: RE | Admit: 2018-09-26 | Payer: Medicare Other | Source: Ambulatory Visit

## 2018-09-27 ENCOUNTER — Telehealth: Payer: Self-pay | Admitting: Gastroenterology

## 2018-09-27 NOTE — Telephone Encounter (Signed)
Pt states that she called to get our address today for her procedure tomorrow and was told that her procedure is today.  She went ahead and completed her prep last night and this morning.  I offered for her to have her procedure today with Dr. Fuller Plan but she prefers to wait until tomorrow with Dr. Lyndel Safe.  Instructed her that she can continue to drink clear liquids until 1:00pm tomorrow but no solid food. Pt verbalizes understanding.

## 2018-09-28 ENCOUNTER — Ambulatory Visit (AMBULATORY_SURGERY_CENTER): Payer: Medicare Other | Admitting: Gastroenterology

## 2018-09-28 ENCOUNTER — Encounter: Payer: Self-pay | Admitting: Gastroenterology

## 2018-09-28 VITALS — BP 157/86 | HR 62 | Temp 97.8°F | Resp 10 | Ht 66.0 in | Wt 190.0 lb

## 2018-09-28 DIAGNOSIS — R195 Other fecal abnormalities: Secondary | ICD-10-CM

## 2018-09-28 DIAGNOSIS — Z538 Procedure and treatment not carried out for other reasons: Secondary | ICD-10-CM | POA: Diagnosis not present

## 2018-09-28 MED ORDER — SODIUM CHLORIDE 0.9 % IV SOLN: 500.0000 mL | Freq: Once | INTRAVENOUS | Status: DC

## 2018-09-28 NOTE — Progress Notes (Signed)
Pt's states no medical or surgical changes since previsit or office visit. 

## 2018-09-28 NOTE — Op Note (Signed)
[facility] Patient Name: Caitlyn Pacheco Procedure Date: 09/28/2018 4:14 PM MRN: [id number] Endoscopist: Jackquline Denmark , MD Age: 62 Referring MD:  Date of Birth: 07-29-1956 Gender: Female Account #: [id number] Procedure:                Colonoscopy Indications:              Heme positive stools with occasional rectal                            bleeding, normal CBC 07/2018. Patient with chronic                            abdominal pain (Adm to [facility] 06/2018) -likely                            musculoskeletal,rule out other causes. Has family                            history of colon cancer in a first-degree relative                            (father) Medicines:                Monitored Anesthesia Care Procedure:                Pre-Anesthesia Assessment:                           - Prior to the procedure, a History and Physical                            was performed, and patient medications and                            allergies were reviewed. The patient's tolerance of                            previous anesthesia was also reviewed. The risks                            and benefits of the procedure and the sedation                            options and risks were discussed with the patient.                            All questions were answered, and informed consent                            was obtained. Prior Anticoagulants: The patient has                            taken no previous anticoagulant or antiplatelet  agents. ASA Grade Assessment: III - A patient with                            severe systemic disease. After reviewing the risks                            and benefits, the patient was deemed in                            satisfactory condition to undergo the procedure.                           After obtaining informed consent, the colonoscope                            was passed under direct vision. Throughout the              procedure, the patient's blood pressure, pulse, and                            oxygen saturations were monitored continuously. The                            Model CF-HQ190L [id number]) scope was introduced                            through the anus and advanced to the the sigmoid                            colon 25 cm from the anal verge. The colonoscopy                            was performed without difficulty. The patient                            tolerated the procedure well. The quality of the                            bowel preparation was unsatisfactory. Scope In: 4:22:49 PM Scope Out: 4:29:20 PM Total Procedure Duration: 0 hours 6 minutes 31 seconds  Findings:                 Incomplete colonoscopy due to retained stool/solid                            vegetable material. Complications:            No immediate complications. Estimated Blood Loss:     Estimated blood loss: none. Impression:               - Preparation of the colon was unsatisfactory.                           - Incomplete colonoscopy. Recommendation:           - Patient has a contact number available for  emergencies. The signs and symptoms of potential                            delayed complications were discussed with the                            patient. Return to normal activities tomorrow.                            Written discharge instructions were provided to the                            patient.                           - Resume previous diet.                           - MiraLAX 17 g p.o. once a day.                           - Continue present medications.                           - Repeat colonoscopy in 12 weeks after 2-day                            preparation, because the bowel preparation was                            suboptimal.                           - Return to GI clinic PRN.                           Addendum: Patient was a difficult IV  stick. Jackquline Denmark, MD 09/28/2018 4:35:49 PM This report has been signed electronically.

## 2018-09-28 NOTE — Progress Notes (Signed)
Pt to call us back on Monday at [phone] to schedule 12 week from now repeat colonoscopy with extensive prep.  Pt aware. That schedule is not open to [place] in Epic for 12 weeks from now.

## 2018-09-28 NOTE — Progress Notes (Signed)
PT taken to PACU. Monitors in place. VSS. Report given to RN. 

## 2018-09-28 NOTE — Patient Instructions (Signed)
Take Miralax 17gms once to twice daily. Call back Monday for repeat colonoscopy with EXTENSIVE prep to be schedules 12 weeks from now. Call us at [phone]    YOU HAD AN ENDOSCOPIC PROCEDURE TODAY AT [place]:   Refer to the procedure report that was given to you for any specific questions about what was found during the examination.  If the procedure report does not answer your questions, please call your gastroenterologist to clarify.  If you requested that your care partner not be given the details of your procedure findings, then the procedure report has been included in a sealed envelope for you to review at your convenience later.  YOU SHOULD EXPECT: Some feelings of bloating in the abdomen. Passage of more gas than usual.  Walking can help get rid of the air that was put into your GI tract during the procedure and reduce the bloating. If you had a lower endoscopy (such as a colonoscopy or flexible sigmoidoscopy) you may notice spotting of blood in your stool or on the toilet paper. If you underwent a bowel prep for your procedure, you may not have a normal bowel movement for a few days.  Please Note:  You might notice some irritation and congestion in your nose or some drainage.  This is from the oxygen used during your procedure.  There is no need for concern and it should clear up in a day or so.  SYMPTOMS TO REPORT IMMEDIATELY:   Following lower endoscopy (colonoscopy or flexible sigmoidoscopy):  Excessive amounts of blood in the stool  Significant tenderness or worsening of abdominal pains  Swelling of the abdomen that is new, acute  Fever of 100F or higher   For urgent or emergent issues, a gastroenterologist can be reached at any hour by calling [phone].   DIET:  We do recommend a small meal at first, but then you may proceed to your regular diet.  Drink plenty of fluids but you should avoid alcoholic beverages for 24 hours.  ACTIVITY:  You  should plan to take it easy for the rest of today and you should NOT DRIVE or use heavy machinery until tomorrow (because of the sedation medicines used during the test).    FOLLOW UP: Our staff will call the number listed on your records the next business day following your procedure to check on you and address any questions or concerns that you may have regarding the information given to you following your procedure. If we do not reach you, we will leave a message.  However, if you are feeling well and you are not experiencing any problems, there is no need to return our call.  We will assume that you have returned to your regular daily activities without incident.  If any biopsies were taken you will be contacted by phone or by letter within the next 1-3 weeks.  Please call us at [phone] if you have not heard about the biopsies in 3 weeks.    SIGNATURES/CONFIDENTIALITY: You and/or your care partner have signed paperwork which will be entered into your electronic medical record.  These signatures attest to the fact that that the information above on your After Visit Summary has been reviewed and is understood.  Full responsibility of the confidentiality of this discharge information lies with you and/or your care-partner.

## 2018-10-01 ENCOUNTER — Telehealth: Payer: Self-pay | Admitting: *Deleted

## 2018-10-01 ENCOUNTER — Ambulatory Visit: Payer: Medicare Other

## 2018-10-01 ENCOUNTER — Inpatient Hospital Stay (HOSPITAL_COMMUNITY): Admission: RE | Admit: 2018-10-01 | Payer: Medicare Other | Source: Ambulatory Visit

## 2018-10-01 NOTE — Telephone Encounter (Signed)
Left message on f/u call 

## 2018-10-01 NOTE — Telephone Encounter (Signed)
  Follow up Call-  Call back number 09/28/2018  Post procedure Call Back phone  # [phone]  Permission to leave phone message Yes  Some recent data might be hidden     Patient questions:  Do you have a fever, pain , or abdominal swelling? No. Pain Score  0 *  Have you tolerated food without any problems? Yes.    Have you been able to return to your normal activities? Yes.    Do you have any questions about your discharge instructions: Diet   No. Medications  No. Follow up visit  No.  Do you have questions or concerns about your Care? No.  Actions: * If pain score is 4 or above: No action needed, pain <4.

## 2018-10-14 MED FILL — SOFOSBUVIR-VELPATASVIR 400-: 400-100 | 28 days supply | Qty: 28 | Fill #2

## 2018-11-22 ENCOUNTER — Telehealth: Payer: Self-pay | Admitting: *Deleted

## 2018-11-22 NOTE — Telephone Encounter (Signed)
RN received message from patient (she left message on [facility] extension).  RN returned the call, left message asking her to call back. She previously saw Colletta [place] and was supposed to start treatment for epclusa, following up with Pharmacy 11/11 (no showed).   Landis Gandy, RN

## 2018-11-22 NOTE — Telephone Encounter (Signed)
She has no showed many times - Cassie may have more current information about her. I believe she started treatment and (hopefully) has completed it by now... Would suspect she needs SVR RNA to ensure cure.

## 2018-11-22 NOTE — Telephone Encounter (Signed)
Yes - she has no showed me 3 times I believe.  She got her last refill sent to her on 11/26, so she should be all finished up by now.

## 2018-11-28 ENCOUNTER — Ambulatory Visit: Payer: Medicare Other | Admitting: Infectious Diseases

## 2018-12-03 ENCOUNTER — Encounter: Payer: Self-pay | Admitting: Infectious Diseases

## 2018-12-03 ENCOUNTER — Ambulatory Visit (INDEPENDENT_AMBULATORY_CARE_PROVIDER_SITE_OTHER): Payer: Medicare Other | Admitting: Infectious Diseases

## 2018-12-03 VITALS — BP 126/85 | HR 74 | Temp 98.8°F | Wt 202.0 lb

## 2018-12-03 DIAGNOSIS — B182 Chronic viral hepatitis C: Secondary | ICD-10-CM

## 2018-12-03 NOTE — Progress Notes (Signed)
Patient Name: Caitlyn Pacheco  Date of Birth: Apr 17, 1956  MRN: [id number]  PCP: Harvie Junior, MD  Referring Provider: Dr. Cheree Ditto   Patient Active Problem List   Diagnosis Date Noted  . Hepatitis C 08/14/2018  . History of difficult venous access 08/14/2018  . Chronic depression 04/08/2017  . Hypokalemia 04/08/2017  . HTN (hypertension) 04/06/2017  . Chronic pain syndrome 04/06/2017    SUBJECTIVE: CC:  Here for follow up on Hepatitis C. Multiple other medical issues lately that has prohibited her from coming back to follow up lately. Increased pain of left leg r/t shingles.   HPI/ROS:  Caitlyn Pacheco is a 63 y.o. female chronic hepatitis C infection. Genotype 1a, F0/1. Acquired possibly due to blood transfusion in 1980s. Fatty infiltration on ultrasound but otherwise normal and w/o cirrhosis or mass effect.    She has had a hard time with her health lately. Required hospital admission for right total shoulder replacement in October for which she is still rehabbing. Shortly after this had an outbreak of shingles - received acyclovir therapy for this but still having severe pain that has not allowed for any sleep up left lateral thigh where rash was. She is on lyrica baseline for pain control and has used lidocaine patches without relief. This was her first outbreak and wondering about shingles vaccines.   Despite all of this she has not missed a single pill of her Epclusa therapy and brought it with her to the hospital. She tolerated therapy very well and thankful she has completed this. She started in early October (estimates October 10th) and continued x 12 weeks. She has not had repeated viral load to gauge treatment response due to the above.   Constitutional: negative for fevers, chills, malaise and anorexia Eyes: negative for icterus Respiratory: negative for cough or dyspnea on exertion Cardiovascular: negative for chest pain, dyspnea, orthopnea, lower extremity  edema Gastrointestinal: negative except for melena, abdominal pain and chronic constipation Genitourinary: negative for dysuria, hematuria and dark urine  Hematologic/lymphatic: negative for easy bruising and +hemocult stool. On Plavix  Musculoskeletal: negative for arthralgias Neurological: negative for headaches, memory problems, coordination problems, gait problems and tremor Behavioral/Psych: negative Endocrine: negative All other systems reviewed and are negative      Past Medical History:  Diagnosis Date  . CHF (congestive heart failure) ([place])   . Chronic pain syndrome   . HCV (hepatitis C virus)   . Hypertension   . Osteoarthritis   . Stroke [facility]) 1996   Outpatient Medications Prior to Visit  Medication Sig Dispense Refill  . albuterol (ACCUNEB) 1.25 MG/3ML nebulizer solution Take 3 mLs by nebulization 3 (three) times daily as needed for wheezing or shortness of breath.     Marland Kitchen albuterol (PROAIR HFA) 108 (90 Base) MCG/ACT inhaler Inhale 2 puffs into the lungs every 6 (six) hours as needed for wheezing or shortness of breath.    Marland Kitchen aspirin 81 MG chewable tablet Chew 81 mg by mouth daily.    . budesonide-formoterol (SYMBICORT) 160-4.5 MCG/ACT inhaler Inhale 2 puffs into the lungs 2 (two) times daily.    . clopidogrel (PLAVIX) 75 MG tablet Take 75 mg by mouth daily.    . fluticasone (FLONASE ALLERGY RELIEF) 50 MCG/ACT nasal spray Place 2 sprays into both nostrils daily as needed for allergies or rhinitis.    . Fluticasone-Salmeterol (ADVAIR DISKUS) 250-50 MCG/DOSE AEPB Inhale 1 puff into the lungs 2 (two) times daily.    . furosemide (LASIX) 40  MG tablet Take 0.5 tablets (20 mg total) by mouth daily. 30 tablet 0  . lubiprostone (AMITIZA) 24 MCG capsule Take 24 mcg by mouth 2 (two) times daily with a meal.    . naloxone (NARCAN) nasal spray 4 mg/0.1 mL 1 spray See admin instructions. INTO 1 NOSTRIL AND MAY REPEAT EVERY 2-3 MINUTES UNTIL EMERGENCY ARRIVES    . Oxycodone HCl 10 MG TABS  Take 1 tablet (10 mg total) by mouth every 6 (six) hours as needed. 10 tablet 0  . pantoprazole (PROTONIX) 40 MG tablet Take 1 tablet (40 mg total) by mouth daily. 30 tablet 11  . pregabalin (LYRICA) 150 MG capsule Take 200 mg by mouth 3 (three) times daily.     . Sofosbuvir-Velpatasvir (EPCLUSA) 400-100 MG TABS Take 1 tablet by mouth daily. 28 tablet 2  . Olmesartan-amLODIPine-HCTZ 40-10-25 MG TABS Take 1 tablet by mouth daily.     No facility-administered medications prior to visit.     Allergies  Allergen Reactions  . Morphine And Related Other (See Comments), Hives and Itching    Per her doctor Per her doctor  . Acetaminophen Other (See Comments)    Liver enlargement- hep C  . Ibuprofen     Per her doctor  . Nsaids     Per her doctor  . Prednisone Other (See Comments)    Per her doctor  . Statins Other (See Comments)    Affected liver enzymes Affected liver enzymes    Social History   Tobacco Use  . Smoking status: Current Every Day Smoker    Packs/day: 0.50    Types: Cigarettes  . Smokeless tobacco: Never Used  . Tobacco comment: pt rpeorts using about 6 per day  Substance Use Topics  . Alcohol use: No  . Drug use: No    Family History  Problem Relation Age of Onset  . Hepatitis C Sister     Objective:   Vitals:   12/03/18 1444  BP: 126/85  Pulse: 74  Temp: 98.8 F (37.1 C)   Constitutional: in no apparent distress, well developed and well nourished and anicteric Eyes: anicteric Cardiovascular: Cor RRR and No murmurs Respiratory: clear Gastrointestinal: Bowel sounds are normal, liver is not enlarged, spleen is not enlarged Musculoskeletal: peripheral pulses normal, no pedal edema, no clubbing or cyanosis Skin: negative for - jaundice, spider hemangioma, telangiectasia, palmar erythema, ecchymosis and atrophy; no porphyria cutanea tarda Lymphatic: no cervical lymphadenopathy   Laboratory: Genotype:  Lab Results  Component Value Date    HCVGENOTYPE 1a 08/15/2018   HCV viral load:  Lab Results  Component Value Date   HCVQUANT 4,880,000 08/15/2018   Lab Results  Component Value Date   WBC 4.7 08/15/2018   HGB 13.4 08/15/2018   HCT 42.2 08/15/2018   MCV 75.0 (L) 08/15/2018   PLT 204 08/15/2018    Lab Results  Component Value Date   CREATININE 0.88 08/15/2018   BUN 11 08/15/2018   NA 138 08/15/2018   K 3.6 08/15/2018   CL 101 08/15/2018   CO2 27 08/15/2018    Lab Results  Component Value Date   ALT 51 (H) 08/15/2018   AST 48 (H) 08/15/2018   ALKPHOS 111 08/15/2018    Lab Results  Component Value Date   INR 1.03 08/15/2018   BILITOT 0.7 08/15/2018   ALBUMIN 3.5 08/15/2018    Assessment & Plan:   Problem List Items Addressed This Visit      Unprioritized   Hepatitis C -  Primary    Genotype 1a, F0/1. Completed 12 weeks of epclusa therapy with end of treatment about a week ago. Will check ETR today and have her return in 12 weeks for SVR and cure visit. Without severe fibrosis/cirrhosis on testing she will not require any ongoing [place] surveillance. NAFLD will need to be addressed with diet/weight loss and exercise. She is in care with [facility].   Return in about 3 months (around 03/04/2019) for labs, follow up.       Relevant Orders   Hepatitis C RNA quantitative     I spent 45 minutes with the patient including greater than 70% of time in face to face counsel of the patient re hepatitis c and the details described above and in coordination of their care.  Janene Madeira, MSN, NP-C [facility] for Infectious Disease [email or web address] Pager: [phone] Office: [phone]

## 2018-12-03 NOTE — Patient Instructions (Addendum)
Look into the Shingrix vaccine for shingles - this will help reduce frequency of outbreaks and with post hepatic neuralgia.

## 2018-12-04 NOTE — Assessment & Plan Note (Signed)
Genotype 1a, F0/1. Completed 12 weeks of epclusa therapy with end of treatment about a week ago. Will check ETR today and have her return in 12 weeks for SVR and cure visit. Without severe fibrosis/cirrhosis on testing she will not require any ongoing [place] surveillance. NAFLD will need to be addressed with diet/weight loss and exercise. She is in care with [facility].   Return in about 3 months (around 03/04/2019) for labs, follow up.

## 2018-12-05 LAB — HEPATITIS C RNA QUANTITATIVE
HCV QUANT LOG: NOT DETECTED {Log_IU}/mL
HCV RNA, PCR, QN: NOT DETECTED [IU]/mL

## 2018-12-05 NOTE — Progress Notes (Signed)
Patient called with results of negative hep c RNA. She is thrilled. Explained that this does not correct fatty liver inflammation and still will need to keep a healthy weight and a diet that allows her to make this possible with reduced carbohydrate/sugar.

## 2018-12-17 ENCOUNTER — Encounter: Payer: Self-pay | Admitting: Gastroenterology

## 2018-12-25 ENCOUNTER — Telehealth: Payer: Self-pay | Admitting: *Deleted

## 2018-12-25 NOTE — Telephone Encounter (Signed)
Patient is for a repeat colonoscopy on 01/17/2019. Her last colon was on 09/28/18 was incomplete due to poor prep. She is currently on Plavix. Does she need to hold this for 5 days? Do we need ok for the hold from the prescribing MD? I could not find any ok to hold Plavix from any MD in the chart? Also in her colon report you documented that patient was a hard IV stick. Her IV was placed in her foot during her last colon after 5 attempts. [place] for [employer]? Please advise. Thank you, Stasha Naraine pv

## 2019-01-02 ENCOUNTER — Telehealth: Payer: Self-pay | Admitting: *Deleted

## 2019-01-02 NOTE — Telephone Encounter (Signed)
1708- Pt did not call to RS PV before 5 pm= Cancelled colon on 2-27- Mailed a no show letter to pt.  Lelan Pons PV

## 2019-01-02 NOTE — Telephone Encounter (Signed)
Attempted to call pt to RS missed PV at 230 pm today 2-12- No answer- LM on voice mail that identifies pt she needs to call and RS PV by 5 pm today, 2-12 WED>

## 2019-01-07 NOTE — Telephone Encounter (Signed)
She does need to hold Plavix for 5 days. Please get it cleared from her family physician I think she will be okay for [place]. Please have her come in little bit earlier Thanks for looking out

## 2019-01-08 NOTE — Telephone Encounter (Signed)
Notification of procedure being scheduled for patient on 01/28/2019 at 1:30pm with pre visit on 01/18/2019 arrival at 9:45am for a 10:00am appt; patient has been notified via phone call of rescheduled appts;  patient will need 2 day prep and will need to come for procedure a little earlier due to patient being a hard stick (per MD order); Patient was advised to call back if questions/concerns arise;Patient verbalized understanding of information/instructions;

## 2019-01-10 NOTE — Telephone Encounter (Signed)
Caitlyn Pacheco received Plavix hold x 5 days- attached copy to PV chart for 2-28 PV

## 2019-01-17 ENCOUNTER — Encounter: Payer: Medicare Other | Admitting: Gastroenterology

## 2019-01-18 ENCOUNTER — Telehealth: Payer: Self-pay | Admitting: *Deleted

## 2019-01-18 NOTE — Telephone Encounter (Signed)
PATIENT NO SHOWED FOR PREVISIT TODAY AT 10 AM. CALLED PATIENT, MESSAGE LEFT TO CALL PRIOR TO 5 PM TODAY TO AVOID CANCELLATION OF UPCOMING COLONOSCOPY ON 01/28/19.

## 2019-01-18 NOTE — Telephone Encounter (Signed)
Attempted to call patient again to reschedule. Unable to reach the patient. No show letter sent to patient. Previsit and Colonoscopy cancelled.

## 2019-01-25 ENCOUNTER — Encounter: Payer: Medicare Other | Admitting: Gastroenterology

## 2019-01-28 ENCOUNTER — Encounter: Payer: Medicare Other | Admitting: Gastroenterology

## 2019-02-13 ENCOUNTER — Telehealth: Payer: Self-pay | Admitting: Gastroenterology

## 2019-02-13 NOTE — Telephone Encounter (Signed)
Records are in black box. PT has NoShow for multiple previsit

## 2019-03-04 ENCOUNTER — Other Ambulatory Visit: Payer: Medicare Other

## 2019-03-04 DIAGNOSIS — I509 Heart failure, unspecified: Secondary | ICD-10-CM

## 2019-03-04 DIAGNOSIS — I2510 Atherosclerotic heart disease of native coronary artery without angina pectoris: Secondary | ICD-10-CM

## 2019-03-04 DIAGNOSIS — K5900 Constipation, unspecified: Secondary | ICD-10-CM

## 2019-03-04 DIAGNOSIS — K219 Gastro-esophageal reflux disease without esophagitis: Secondary | ICD-10-CM

## 2019-03-04 DIAGNOSIS — J449 Chronic obstructive pulmonary disease, unspecified: Secondary | ICD-10-CM

## 2019-03-04 DIAGNOSIS — R079 Chest pain, unspecified: Secondary | ICD-10-CM | POA: Diagnosis not present

## 2019-03-05 DIAGNOSIS — K5900 Constipation, unspecified: Secondary | ICD-10-CM | POA: Diagnosis not present

## 2019-03-05 DIAGNOSIS — R079 Chest pain, unspecified: Secondary | ICD-10-CM | POA: Diagnosis not present

## 2019-03-05 DIAGNOSIS — I2510 Atherosclerotic heart disease of native coronary artery without angina pectoris: Secondary | ICD-10-CM | POA: Diagnosis not present

## 2019-03-05 DIAGNOSIS — I509 Heart failure, unspecified: Secondary | ICD-10-CM | POA: Diagnosis not present

## 2019-03-15 ENCOUNTER — Telehealth: Payer: Self-pay | Admitting: Infectious Diseases

## 2019-03-15 NOTE — Telephone Encounter (Signed)
COVID-19 Pre-Screening Questions: ° °Do you currently have a fever (>100 °F), chills or unexplained body aches? No  °Are you currently experiencing new cough, shortness of breath, sore throat, runny nose?no  °•  °Have you recently travelled outside the state of [place] in the last 14 days?no  °•  °Have you been in contact with someone that is currently pending confirmation of Covid19 testing or has been confirmed to have the Covid19 virus?  No  °

## 2019-03-18 ENCOUNTER — Ambulatory Visit (INDEPENDENT_AMBULATORY_CARE_PROVIDER_SITE_OTHER): Payer: Medicare Other | Admitting: Infectious Diseases

## 2019-03-18 ENCOUNTER — Other Ambulatory Visit: Payer: Self-pay

## 2019-03-18 ENCOUNTER — Encounter: Payer: Medicare Other | Admitting: Infectious Diseases

## 2019-03-18 ENCOUNTER — Encounter: Payer: Self-pay | Admitting: Infectious Diseases

## 2019-03-18 VITALS — BP 158/97 | HR 84 | Temp 98.9°F | Wt 202.0 lb

## 2019-03-18 DIAGNOSIS — B182 Chronic viral hepatitis C: Secondary | ICD-10-CM

## 2019-03-18 NOTE — Patient Instructions (Signed)
So nice to see you today Caitlyn Pacheco.  It is always a pleasure to talk with you.  It sounds like you have been through a lot since our last office visit and I pray that things will ease up for you and your family soon.  We will need to do a little blood work today to definitively consider you cured from your hepatitis C infection.  As soon as results are and I will notify you over the phone.  You did not have any signs of any long-term or permanent damage from your hepatitis C and will not require any further follow-up care in regards to your liver which is wonderful news.  Looking forward to calling you at the end of the week or early next with results.

## 2019-03-18 NOTE — Progress Notes (Signed)
Patient Name: Caitlyn Pacheco  Date of Birth: 1956/04/28  MRN: [id number]  PCP: Harvie Junior, MD  Referring Provider: Dr. Cheree Ditto   Patient Active Problem List   Diagnosis Date Noted  . Hepatitis C 08/14/2018  . History of difficult venous access 08/14/2018  . Chronic depression 04/08/2017  . Hypokalemia 04/08/2017  . HTN (hypertension) 04/06/2017  . Chronic pain syndrome 04/06/2017    SUBJECTIVE: CC:  Here for follow up on Hepatitis C. Since our last visit she has had her right shoulder replaced. Concerned over weight gain - attributed to Lyrica. Lost a family member to COVID-74.   HPI/ROS:  Caitlyn Pacheco is a 63 y.o. female chronic hepatitis C infection. Genotype 1a, F0/1 s/p treatment with Epclusa x 12 weeks (Started 08/30/18). Acquired possibly due to blood transfusion in 1980s. Fatty infiltration on ultrasound but otherwise normal and w/o cirrhosis or mass effect.    Since her last visit Caitlyn Pacheco has had at least 1 or 2 other hospitalizations at [facility].  She underwent a right total shoulder replacement and is recovering well.  She describes a hospital stay in regarding chest pain and what sounds to have been a possible infection.  She has noticed a fair amount of weight gain since her last office visit which concerns her but she feels like this might be due to the Lyrica.  She wants to continue the Lyrica as it has helped immensely controlling her pain and has allowed her to come off some of her narcotic medications.  We spent a lot of our visit discussing the current coronavirus outbreak.  She unfortunately lost a sister recently and is still recovering from this event.  ROS: Constitutional: negative for fevers, chills, malaise and anorexia, positive for weight gain Eyes: negative for icterus Respiratory: negative for cough or dyspnea on exertion Cardiovascular: negative for chest pain, dyspnea, orthopnea, lower extremity edema Gastrointestinal: negative except  for melena, abdominal pain and chronic constipation Genitourinary: negative for dysuria, hematuria and dark urine  Hematologic/lymphatic: negative for easy bruising and positive bloody stool - on Plavix  Musculoskeletal: negative for arthralgias Neurological: negative for headaches, memory problems, coordination problems, gait problems and tremor Behavioral/Psych: negative Endocrine: negative All other systems reviewed and are negative      Past Medical History:  Diagnosis Date  . CHF (congestive heart failure) ([place])   . Chronic pain syndrome   . HCV (hepatitis C virus)   . Hypertension   . Osteoarthritis   . Stroke [facility]) 1996   Outpatient Medications Prior to Visit  Medication Sig Dispense Refill  . albuterol (ACCUNEB) 1.25 MG/3ML nebulizer solution Take 3 mLs by nebulization 3 (three) times daily as needed for wheezing or shortness of breath.     Marland Kitchen albuterol (PROAIR HFA) 108 (90 Base) MCG/ACT inhaler Inhale 2 puffs into the lungs every 6 (six) hours as needed for wheezing or shortness of breath.    Marland Kitchen aspirin 81 MG chewable tablet Chew 81 mg by mouth daily.    . budesonide-formoterol (SYMBICORT) 160-4.5 MCG/ACT inhaler Inhale 2 puffs into the lungs 2 (two) times daily.    . clopidogrel (PLAVIX) 75 MG tablet Take 75 mg by mouth daily.    . fluticasone (FLONASE ALLERGY RELIEF) 50 MCG/ACT nasal spray Place 2 sprays into both nostrils daily as needed for allergies or rhinitis.    . Fluticasone-Salmeterol (ADVAIR DISKUS) 250-50 MCG/DOSE AEPB Inhale 1 puff into the lungs 2 (two) times daily.    . furosemide (LASIX) 40  MG tablet Take 0.5 tablets (20 mg total) by mouth daily. 30 tablet 0  . lubiprostone (AMITIZA) 24 MCG capsule Take 24 mcg by mouth 2 (two) times daily with a meal.    . naloxone (NARCAN) nasal spray 4 mg/0.1 mL 1 spray See admin instructions. INTO 1 NOSTRIL AND MAY REPEAT EVERY 2-3 MINUTES UNTIL EMERGENCY ARRIVES    . Olmesartan-amLODIPine-HCTZ 40-10-25 MG TABS Take 1 tablet by  mouth daily.    . Oxycodone HCl 10 MG TABS Take 1 tablet (10 mg total) by mouth every 6 (six) hours as needed. 10 tablet 0  . pantoprazole (PROTONIX) 40 MG tablet Take 1 tablet (40 mg total) by mouth daily. 30 tablet 11  . pregabalin (LYRICA) 150 MG capsule Take 200 mg by mouth 3 (three) times daily.     . Sofosbuvir-Velpatasvir (EPCLUSA) 400-100 MG TABS Take 1 tablet by mouth daily. 28 tablet 2   No facility-administered medications prior to visit.     Allergies  Allergen Reactions  . Morphine And Related Other (See Comments), Hives and Itching    Per her doctor Per her doctor  . Acetaminophen Other (See Comments)    Liver enlargement- hep C  . Ibuprofen     Per her doctor  . Nsaids     Per her doctor  . Prednisone Other (See Comments)    Per her doctor  . Statins Other (See Comments)    Affected liver enzymes Affected liver enzymes    Social History   Tobacco Use  . Smoking status: Current Every Day Smoker    Packs/day: 0.50    Types: Cigarettes  . Smokeless tobacco: Never Used  . Tobacco comment: pt rpeorts using about 6 per day  Substance Use Topics  . Alcohol use: No  . Drug use: No    Family History  Problem Relation Age of Onset  . Hepatitis C Sister     Objective:   Vitals:   03/18/19 1042  BP: (!) 158/97  Pulse: 84  Temp: 98.9 F (37.2 C)   Constitutional: in no apparent distress, well developed and well nourished and anicteric.  12 pound weight gain since last office visit.  Eyes: anicteric Cardiovascular: Cor RRR and No murmurs Respiratory: clear Gastrointestinal: Bowel sounds are normal, liver is not enlarged, spleen is not enlarged Musculoskeletal: peripheral pulses normal, no pedal edema, no clubbing or cyanosis Skin: negative for - jaundice, spider hemangioma, telangiectasia, palmar erythema, ecchymosis and atrophy; no porphyria cutanea tarda Lymphatic: no cervical lymphadenopathy   Laboratory: Genotype:  Lab Results  Component Value  Date   HCVGENOTYPE 1a 08/15/2018   HCV viral load:  Lab Results  Component Value Date   HCVQUANT 4,880,000 08/15/2018   Lab Results  Component Value Date   WBC 4.7 08/15/2018   HGB 13.4 08/15/2018   HCT 42.2 08/15/2018   MCV 75.0 (L) 08/15/2018   PLT 204 08/15/2018    Lab Results  Component Value Date   CREATININE 0.88 08/15/2018   BUN 11 08/15/2018   NA 138 08/15/2018   K 3.6 08/15/2018   CL 101 08/15/2018   CO2 27 08/15/2018    Lab Results  Component Value Date   ALT 51 (H) 08/15/2018   AST 48 (H) 08/15/2018   ALKPHOS 111 08/15/2018    Lab Results  Component Value Date   INR 1.03 08/15/2018   BILITOT 0.7 08/15/2018   ALBUMIN 3.5 08/15/2018    Assessment & Plan:   Problem List Items Addressed This  Visit      Unprioritized   Hepatitis C - Primary    Genotype 1a, fibrosis score 0 on Medicare scale.  She completed 12 weeks of Epclusa with completion in early January 2020.  Her ETR level was undetectable.  We will repeat hep C RNA for SVR today.  I will give her a call with results over the phone.  We again discussed her imaging that seems to indicate some fatty infiltration.  Discussed importance of staying within a healthy weight range to protect her liver from future damage that can occur with NAFLD.  She is in care with of our GI for other issues.  She will not need ongoing [place] surveillance.  She does not need to return for regular visits barring today's lab results.      Relevant Orders   Hepatitis C RNA quantitative     I spent 15 min with the patient in discussion of the above.   Janene Madeira, MSN, NP-C [facility] for Infectious Disease [email or web address] Pager: [phone] Office: [phone]

## 2019-03-18 NOTE — Assessment & Plan Note (Signed)
Genotype 1a, fibrosis score 0 on Medicare scale.  She completed 12 weeks of Epclusa with completion in early January 2020.  Her ETR level was undetectable.  We will repeat hep C RNA for SVR today.  I will give her a call with results over the phone.  We again discussed her imaging that seems to indicate some fatty infiltration.  Discussed importance of staying within a healthy weight range to protect her liver from future damage that can occur with NAFLD.  She is in care with of our GI for other issues.  She will not need ongoing [place] surveillance.  She does not need to return for regular visits barring today's lab results.

## 2019-03-20 ENCOUNTER — Telehealth: Payer: Self-pay

## 2019-03-20 NOTE — Telephone Encounter (Signed)
Called patient to verify authorization to release health information to [facility]. Left voicemail to call office. Copy of document left in triage and original ready to scan into epic.  Eugenia Mcalpine, LPN

## 2019-03-21 LAB — HEPATITIS C RNA QUANTITATIVE
HCV Quantitative Log: <1.18 Log IU/mL
HCV RNA, PCR, QN: <15 IU/mL

## 2019-03-22 ENCOUNTER — Telehealth: Payer: Self-pay | Admitting: *Deleted

## 2019-03-22 NOTE — Progress Notes (Signed)
Please give Caitlyn Pacheco a call to let her know that her hepatitis C has been completely cured.  Her lab work indicates no active virus at the 12-week point following her last pill.  She does not need any follow-up with our clinic and she based off of the initial work-up did not have any permanent or severe damage.  All of the inflammation in her liver due to the hepatitis C infection has been resolved.Marland Kitchen

## 2019-03-22 NOTE — Telephone Encounter (Signed)
-----   Message from Chilili Callas, NP sent at 03/22/2019  1:19 PM EDT ----- Please give Caitlyn Pacheco a call to let her know that her hepatitis C has been completely cured.  Her lab work indicates no active virus at the 12-week point following her last pill.  She does not need any follow-up with our clinic and she based off of the initial work-up did not have any permanent or severe damage.  All of the inflammation in her liver due to the hepatitis C infection has been resolved.Marland Kitchen

## 2019-03-22 NOTE — Telephone Encounter (Signed)
Called patient per [place] request to give results and had to leave a message for her to give the office a call.

## 2019-05-21 ENCOUNTER — Ambulatory Visit (AMBULATORY_SURGERY_CENTER): Payer: Self-pay

## 2019-05-21 ENCOUNTER — Other Ambulatory Visit: Payer: Self-pay

## 2019-05-21 VITALS — Ht 66.5 in | Wt 201.0 lb

## 2019-05-21 DIAGNOSIS — R195 Other fecal abnormalities: Secondary | ICD-10-CM

## 2019-05-21 MED ORDER — NA SULFATE-K SULFATE-MG SULF 17.5-3.13-1.6 GM/177ML PO SOLN: 1.0000 | Freq: Once | ORAL | 0 refills | Status: AC

## 2019-05-21 NOTE — Progress Notes (Signed)
Denies allergies to eggs or soy products. Denies complication of anesthesia or sedation. Denies use of weight loss medication. Denies use of O2.   Emmi instructions given for colonoscopy.  Pre-Visit was conducted by phone due to Covid 19. Instructions were reviewed and mailed to patients confirmed home address. Patient was encouraged to call if she had any questions regarding the instructions.

## 2019-06-03 ENCOUNTER — Telehealth: Payer: Self-pay | Admitting: Gastroenterology

## 2019-06-03 NOTE — Telephone Encounter (Signed)

## 2019-06-04 ENCOUNTER — Encounter: Payer: Self-pay | Admitting: Gastroenterology

## 2019-06-04 ENCOUNTER — Ambulatory Visit (AMBULATORY_SURGERY_CENTER): Payer: Medicare Other | Admitting: Gastroenterology

## 2019-06-04 ENCOUNTER — Other Ambulatory Visit: Payer: Self-pay

## 2019-06-04 VITALS — BP 115/61 | HR 58 | Temp 98.6°F | Resp 17 | Ht 66.0 in | Wt 207.0 lb

## 2019-06-04 DIAGNOSIS — K648 Other hemorrhoids: Secondary | ICD-10-CM

## 2019-06-04 DIAGNOSIS — R195 Other fecal abnormalities: Secondary | ICD-10-CM | POA: Diagnosis not present

## 2019-06-04 DIAGNOSIS — D124 Benign neoplasm of descending colon: Secondary | ICD-10-CM

## 2019-06-04 DIAGNOSIS — K5730 Diverticulosis of large intestine without perforation or abscess without bleeding: Secondary | ICD-10-CM

## 2019-06-04 MED ORDER — SODIUM CHLORIDE 0.9 % IV SOLN: 500.0000 mL | Freq: Once | INTRAVENOUS | Status: DC

## 2019-06-04 NOTE — Progress Notes (Signed)
Pt first arrived to recovery said she was having nausea.  HOB elevated.  Pt said she has nausea daily and takes phenergan daily also.  Pt given emises basin and tissues.

## 2019-06-04 NOTE — Progress Notes (Signed)
Pt's states no medical or surgical changes since previsit or office visit. Covid- Nancy Vitals- [place] 

## 2019-06-04 NOTE — Progress Notes (Signed)
Per Dr. Lyndel Safe- resume Plavix according to normal dose tomorrow 06-05-19

## 2019-06-04 NOTE — Patient Instructions (Addendum)
YOU HAD AN ENDOSCOPIC PROCEDURE TODAY AT [facility]:   Refer to the procedure report that was given to you for any specific questions about what was found during the examination.  If the procedure report does not answer your questions, please call your gastroenterologist to clarify.  If you requested that your care partner not be given the details of your procedure findings, then the procedure report has been included in a sealed envelope for you to review at your convenience later.  YOU SHOULD EXPECT: Some feelings of bloating in the abdomen. Passage of more gas than usual.  Walking can help get rid of the air that was put into your GI tract during the procedure and reduce the bloating. If you had a lower endoscopy (such as a colonoscopy or flexible sigmoidoscopy) you may notice spotting of blood in your stool or on the toilet paper. If you underwent a bowel prep for your procedure, you may not have a normal bowel movement for a few days.  Please Note:  You might notice some irritation and congestion in your nose or some drainage.  This is from the oxygen used during your procedure.  There is no need for concern and it should clear up in a day or so.  SYMPTOMS TO REPORT IMMEDIATELY:   Following lower endoscopy (colonoscopy or flexible sigmoidoscopy):  Excessive amounts of blood in the stool  Significant tenderness or worsening of abdominal pains  Swelling of the abdomen that is new, acute  Fever of 100F or higher   For urgent or emergent issues, a gastroenterologist can be reached at any hour by calling [phone].   DIET:  We do recommend a small meal at first, but then you may proceed to your regular diet.  Drink plenty of fluids but you should avoid alcoholic beverages for 24 hours.  ACTIVITY:  You should plan to take it easy for the rest of today and you should NOT DRIVE or use heavy machinery until tomorrow (because of the sedation medicines used during the test).     FOLLOW UP: Our staff will call the number listed on your records 48-72 hours following your procedure to check on you and address any questions or concerns that you may have regarding the information given to you following your procedure. If we do not reach you, we will leave a message.  We will attempt to reach you two times.  During this call, we will ask if you have developed any symptoms of COVID 19. If you develop any symptoms (ie: fever, flu-like symptoms, shortness of breath, cough etc.) before then, please call [phone].  If you test positive for Covid 19 in the 2 weeks post procedure, please call and report this information to [place].    If any biopsies were taken you will be contacted by phone or by letter within the next 1-3 weeks.  Please call us at [phone] if you have not heard about the biopsies in 3 weeks.    SIGNATURES/CONFIDENTIALITY: You and/or your care partner have signed paperwork which will be entered into your electronic medical record.  These signatures attest to the fact that that the information above on your After Visit Summary has been reviewed and is understood.  Full responsibility of the confidentiality of this discharge information lies with you and/or your care-partner.    Handouts were given to your care partner on polyps, diverticulosis, and hemorrhoids. Continue taking Miralax 17 GM by mouth once a day. You may  resume your current medications today. Await biopsy results. Return to GI office for an appointment in 12 weeks.  The office will call you with this appointment. Please call if any questions or concerns.  Resume Plavix tomorrow 06-05-19 per Dr. Lyndel Safe

## 2019-06-04 NOTE — Op Note (Addendum)
[facility] Patient Name: Caitlyn Pacheco Procedure Date: 06/04/2019 9:38 AM MRN: [id number] Endoscopist: Jackquline Denmark , MD Age: 63 Referring MD:  Date of Birth: 1956-05-27 Gender: Female Account #: [id number] Procedure:                Colonoscopy Indications:              Heme positive stool Medicines:                Monitored Anesthesia Care Procedure:                Pre-Anesthesia Assessment:                           - Prior to the procedure, a History and Physical                            was performed, and patient medications and                            allergies were reviewed. The patient's tolerance of                            previous anesthesia was also reviewed. The risks                            and benefits of the procedure and the sedation                            options and risks were discussed with the patient.                            All questions were answered, and informed consent                            was obtained. Prior Anticoagulants: Plavix was held                            5 days before. ASA Grade Assessment: II - A patient                            with mild systemic disease. After reviewing the                            risks and benefits, the patient was deemed in                            satisfactory condition to undergo the procedure.                           After obtaining informed consent, the colonoscope                            was passed under direct vision. Throughout the  procedure, the patient's blood pressure, pulse, and                            oxygen saturations were monitored continuously. The                            Colonoscope was introduced through the anus and                            advanced to the 2 cm into the ileum. The                            colonoscopy was performed without difficulty. The                            patient tolerated the procedure well. The  quality                            of the bowel preparation was excellent. The                            terminal ileum, ileocecal valve, appendiceal                            orifice, and rectum were photographed. Scope In: 9:44:04 AM Scope Out: 9:54:26 AM Scope Withdrawal Time: 0 hours 8 minutes 20 seconds  Total Procedure Duration: 0 hours 10 minutes 22 seconds  Findings:                 A 4 mm polyp was found in the mid descending colon.                            The polyp was sessile. The polyp was removed with a                            cold snare. Resection and retrieval were complete.                            Estimated blood loss: none.                           A few rare (1-2) small-mouthed diverticula were                            found in the sigmoid colon and ascending colon.                           Non-bleeding internal hemorrhoids were found during                            retroflexion. The hemorrhoids were small.                           The terminal ileum appeared normal.  The exam was otherwise without abnormality on                            direct and retroflexion views. Complications:            No immediate complications. Estimated Blood Loss:     Estimated blood loss: none. Impression:               -Diminutive colonic polyp status post polypectomy.                           -Minimal colonic diverticulosis                           -Small internal hemorrhoids.                           -Otherwise normal colonoscopy to TI. Recommendation:           - Patient has a contact number available for                            emergencies. The signs and symptoms of potential                            delayed complications were discussed with the                            patient. Return to normal activities tomorrow.                            Written discharge instructions were provided to the                            patient.                            - Resume previous diet.                           - Continue present medications.                           -Continue MiraLAX 17 g p.o. once a day.                           - Await pathology results.                           - Repeat colonoscopy for surveillance based on                            pathology results.                           -Resume Plavix from 06/05/2019.                           - Return  to GI office in 12 weeks. Jackquline Denmark, MD 06/04/2019 10:01:23 AM This report has been signed electronically.

## 2019-06-04 NOTE — Progress Notes (Signed)
Per Pt nausea passed.  No vomiting noted.  No other issues noted other than pt was wanting to "go home" asap. maw

## 2019-06-04 NOTE — Progress Notes (Signed)
To PACU, VSS. Report to [email or web address] 

## 2019-06-06 ENCOUNTER — Telehealth: Payer: Self-pay

## 2019-06-06 ENCOUNTER — Telehealth: Payer: Self-pay | Admitting: *Deleted

## 2019-06-06 NOTE — Telephone Encounter (Signed)
  Follow up Call-  Call back number 06/04/2019 09/28/2018  Post procedure Call Back phone  # [phone]  Permission to leave phone message Yes Yes  Some recent data might be hidden     Patient questions:  Do you have a fever, pain , or abdominal swelling? No. Pain Score  0 *  Have you tolerated food without any problems? Yes.    Have you been able to return to your normal activities? Yes.    Do you have any questions about your discharge instructions: Diet   No. Medications  No. Follow up visit  No.  Do you have questions or concerns about your Care? No.  Actions: * If pain score is 4 or above: No action needed, pain <4. 1. Have you developed a fever since your procedure? no  2.   Have you had an respiratory symptoms (SOB or cough) since your procedure? no  3.   Have you tested positive for COVID 19 since your procedure no  4.   Have you had any family members/close contacts diagnosed with the COVID 19 since your procedure?  no   If yes to any of these questions please route to Joylene John, RN and Alphonsa Gin, Therapist, sports.

## 2019-06-06 NOTE — Telephone Encounter (Signed)
  Follow up Call-  Call back number 06/04/2019 09/28/2018  Post procedure Call Back phone  # [phone]  Permission to leave phone message Yes Yes  Some recent data might be hidden     Patient questions:  Phone rang and rang, and then disconnected.

## 2019-06-10 ENCOUNTER — Encounter: Payer: Self-pay | Admitting: Gastroenterology

## 2020-01-06 DIAGNOSIS — E785 Hyperlipidemia, unspecified: Secondary | ICD-10-CM

## 2020-01-06 DIAGNOSIS — J449 Chronic obstructive pulmonary disease, unspecified: Secondary | ICD-10-CM | POA: Diagnosis not present

## 2020-01-06 DIAGNOSIS — I639 Cerebral infarction, unspecified: Secondary | ICD-10-CM

## 2020-01-06 DIAGNOSIS — I2510 Atherosclerotic heart disease of native coronary artery without angina pectoris: Secondary | ICD-10-CM | POA: Diagnosis not present

## 2020-01-06 DIAGNOSIS — R079 Chest pain, unspecified: Secondary | ICD-10-CM | POA: Diagnosis not present

## 2020-01-06 DIAGNOSIS — I10 Essential (primary) hypertension: Secondary | ICD-10-CM

## 2020-01-06 DIAGNOSIS — R748 Abnormal levels of other serum enzymes: Secondary | ICD-10-CM | POA: Diagnosis not present

## 2020-01-07 DIAGNOSIS — R748 Abnormal levels of other serum enzymes: Secondary | ICD-10-CM | POA: Diagnosis not present

## 2020-01-07 DIAGNOSIS — I2510 Atherosclerotic heart disease of native coronary artery without angina pectoris: Secondary | ICD-10-CM | POA: Diagnosis not present

## 2020-01-07 DIAGNOSIS — R079 Chest pain, unspecified: Secondary | ICD-10-CM | POA: Diagnosis not present

## 2020-01-07 DIAGNOSIS — J449 Chronic obstructive pulmonary disease, unspecified: Secondary | ICD-10-CM | POA: Diagnosis not present

## 2021-08-07 DIAGNOSIS — Z8673 Personal history of transient ischemic attack (TIA), and cerebral infarction without residual deficits: Secondary | ICD-10-CM | POA: Diagnosis not present

## 2021-08-07 DIAGNOSIS — R7303 Prediabetes: Secondary | ICD-10-CM | POA: Diagnosis not present

## 2021-08-07 DIAGNOSIS — I10 Essential (primary) hypertension: Secondary | ICD-10-CM

## 2021-08-07 DIAGNOSIS — F17200 Nicotine dependence, unspecified, uncomplicated: Secondary | ICD-10-CM | POA: Diagnosis not present

## 2021-08-07 DIAGNOSIS — G4733 Obstructive sleep apnea (adult) (pediatric): Secondary | ICD-10-CM

## 2021-08-07 DIAGNOSIS — E785 Hyperlipidemia, unspecified: Secondary | ICD-10-CM

## 2021-08-07 DIAGNOSIS — R079 Chest pain, unspecified: Secondary | ICD-10-CM | POA: Diagnosis not present

## 2021-08-20 ENCOUNTER — Encounter: Payer: Self-pay | Admitting: Gastroenterology

## 2021-08-20 ENCOUNTER — Ambulatory Visit (INDEPENDENT_AMBULATORY_CARE_PROVIDER_SITE_OTHER): Payer: Medicare Other | Admitting: Gastroenterology

## 2021-08-20 ENCOUNTER — Other Ambulatory Visit: Payer: Self-pay

## 2021-08-20 VITALS — BP 130/78 | HR 76 | Ht 66.0 in | Wt 194.1 lb

## 2021-08-20 DIAGNOSIS — R1084 Generalized abdominal pain: Secondary | ICD-10-CM

## 2021-08-20 DIAGNOSIS — R110 Nausea: Secondary | ICD-10-CM

## 2021-08-20 DIAGNOSIS — Z800 Family history of malignant neoplasm of digestive organs: Secondary | ICD-10-CM

## 2021-08-20 DIAGNOSIS — K219 Gastro-esophageal reflux disease without esophagitis: Secondary | ICD-10-CM

## 2021-08-20 DIAGNOSIS — B182 Chronic viral hepatitis C: Secondary | ICD-10-CM

## 2021-08-20 DIAGNOSIS — R140 Abdominal distension (gaseous): Secondary | ICD-10-CM

## 2021-08-20 DIAGNOSIS — R197 Diarrhea, unspecified: Secondary | ICD-10-CM

## 2021-08-20 MED ORDER — PANTOPRAZOLE SODIUM 40 MG PO TBEC: 40.0000 mg | DELAYED_RELEASE_TABLET | Freq: Two times a day (BID) | ORAL | 1 refills | Status: DC

## 2021-08-20 MED ORDER — DICYCLOMINE HCL 10 MG PO CAPS: 10.0000 mg | ORAL_CAPSULE | Freq: Two times a day (BID) | ORAL | 2 refills | Status: DC

## 2021-08-20 NOTE — Patient Instructions (Signed)
If you are age 63 or older, your body mass index should be between 23-30. Your Body mass index is 31.33 kg/m. If this is out of the aforementioned range listed, please consider follow up with your Primary Care Provider.  If you are age 4 or younger, your body mass index should be between 19-25. Your Body mass index is 31.33 kg/m. If this is out of the aformentioned range listed, please consider follow up with your Primary Care Provider.   __________________________________________________________  The [facility] providers would like to encourage you to use [facility] to communicate with providers for non-urgent requests or questions.  Due to long hold times on the telephone, sending your provider a message by [facility] may be a faster and more efficient way to get a response.  Please allow 48 business hours for a response.  Please remember that this is for non-urgent requests.   Please get your labs done at [facility] for stool and blood work.  You have been [facility] CT [facility] [place] [address] [address] [facility]   You are scheduled on 09-10-2021 at 930am. You should arrive 15 minutes prior to your appointment time for registration. Please follow the written instructions below on the day of your exam:  WARNING: IF YOU ARE ALLERGIC TO IODINE/X-RAY DYE, PLEASE NOTIFY RADIOLOGY IMMEDIATELY AT [phone]! YOU WILL BE GIVEN A 13 HOUR PREMEDICATION PREP.  1) Do not eat or drink anything after 530am (4 hours prior to your test) 2) You have been given 2 bottles of oral contrast to drink. The solution may taste better if refrigerated, but do NOT add ice or any other liquid to this solution. Shake well before drinking.    Drink 1 bottle of contrast @ 730am (2 hours prior to your exam)  Drink 1 bottle of contrast @ 830am (1 hour prior to your exam)  You may take any medications as prescribed with a small amount of  water, if necessary. If you take any of the following medications: METFORMIN, GLUCOPHAGE, GLUCOVANCE, AVANDAMET, RIOMET, FORTAMET, [place] MET, JANUMET, GLUMETZA or METAGLIP, you MAY be asked to HOLD this medication 48 hours AFTER the exam.  The purpose of you drinking the oral contrast is to aid in the visualization of your intestinal tract. The contrast solution may cause some diarrhea. Depending on your individual set of symptoms, you may also receive an intravenous injection of x-ray contrast/dye. Plan on being at [facility] for 30 minutes or longer, depending on the type of exam you are having performed.  This test typically takes 30-45 minutes to complete.  If you have any questions regarding your exam or if you need to reschedule, you may call the CT department at [phone] between the hours of 8:00 am and 5:00 pm, Monday-Friday.  ________________________________________________________________________  We have sent the following medications to your pharmacy for you to pick up at your convenience: Bentyl 12m 2 times daily Protonix 463m2 times a day for 4 weeks and then decrease to daily   Thank you,  Dr. RaJackquline Denmark

## 2021-08-20 NOTE — Progress Notes (Addendum)
IMPRESSION and PLAN:   #1. Gen abdo pain with nausea, bloating and diarrhea. R/O recurrent SBO.  #2. FH colon ca (dad at age 63). Neg colon 05/2019     #3. 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 S/P vaccines for hepatitis A and B.   #3. GERD  #4. H/O SBO (status post X-Lap in [place] March 2017, February 2012)  #5.  Has significant anxiety/insomnia.   Plan: -Stool studies for GI Pathogen, C. Diff and Calprotectin. -Bentyl 10mg  po BID #60, 2 refiils -Continue protonix 40mg  PO bid x 4 weeks, then QD. -RH recent adm-obtain D/C summary, labs please. -UA -CT AP with PO IV contrast to r/o recurrent PSBO -If still with problems, EGD with SB Bx for further eval.  Note that the patient is on olmesartan as well. -Anxiety/insomnia to be addressed by Dr. Jimmye Norman. -D/W in detail with patient and patient's daughter.  HPI:    Chief Complaint:   Caitlyn Pacheco is a 65 y.o. female  Accompanied by her daughter.  Seen as emergency workin  Was admitted to [facility] 2 weeks ago.  Records are awaited.  She had more chest pains and abdominal pain.  She had negative chest x-ray and underwent cardiac stress test which was unremarkable.  She also had normal ejection fraction.  Found to have UTI and was treated with nitrofurantoin 2 weeks ago.  C/O generalized abdominal pain-mod, associated with abdominal bloating, nausea and decreased appetite.  She denies having any vomiting.  She also has been having diarrhea x 3 weeks (note that she is usually constipated).  She has stopped MiraLAX.  She describes diarrhea as loose watery bowel movements, every time she eats, without any nocturnal symptoms.  No melena or hematochezia.  Has lost weight as below.  Wt Readings from Last 3 Encounters:  08/20/21 194 lb 2 oz (88.1 kg)  06/04/19 207 lb (93.9 kg)  05/21/19 201 lb (91.2 kg)  Also admits that she has not been sleeping well.   Additional GI  history/procedures:  Colon 05/2019 -Diminutive colonic polyp status post polypectomy. -Minimal colonic diverticulosis -Small internal hemorrhoids. -Otherwise normal colonoscopy to TI.  CT AP with contrast 12/2020 1. No acute findings or explanation for the patient's symptoms. 2. No evidence of urinary tract calculus or hydronephrosis. 3. Stable mild extrahepatic biliary dilatation post cholecystectomy, likely physiologic. Correlate with liver function studies. 4. Aortic Atherosclerosis (ICD10-I70.0). Past Medical History:  Diagnosis Date   Anxiety    Blood transfusion without reported diagnosis    CHF (congestive heart failure) (HCC)    Chronic pain syndrome    COPD (chronic obstructive pulmonary disease) (HCC)    GERD (gastroesophageal reflux disease)    HCV (hepatitis C virus)    Hyperlipidemia    Hypertension    Neuromuscular disorder ([place])    nerve damage due to stroke.   Osteoarthritis    Osteoporosis    Sleep apnea    Doesn't use CPAP   Stroke ([place]) 1996   one stroke and two TIA's   Thyroid disease     Current Outpatient Medications  Medication Sig Dispense Refill   albuterol (ACCUNEB) 1.25 MG/3ML nebulizer solution Take 3 mLs by nebulization 3 (three) times daily as needed for wheezing or shortness of breath.      albuterol (VENTOLIN HFA) 108 (90 Base) MCG/ACT inhaler Inhale 2 puffs into the lungs every 6 (six) hours as needed for wheezing or shortness of breath.  aspirin 81 MG chewable tablet Chew 81 mg by mouth daily.     budesonide-formoterol (SYMBICORT) 160-4.5 MCG/ACT inhaler Inhale 2 puffs into the lungs 2 (two) times daily.     clopidogrel (PLAVIX) 75 MG tablet Take 75 mg by mouth daily.     diazepam (VALIUM) 5 MG tablet Take 5 mg by mouth 3 (three) times daily as needed.     diclofenac (VOLTAREN) 25 MG EC tablet Take by mouth.     fenofibrate 160 MG tablet Take by mouth.     fluticasone (FLONASE) 50 MCG/ACT nasal spray Place 2 sprays into both nostrils  daily as needed for allergies or rhinitis.     Fluticasone-Salmeterol (ADVAIR) 250-50 MCG/DOSE AEPB Inhale 1 puff into the lungs 2 (two) times daily.     furosemide (LASIX) 40 MG tablet Take 0.5 tablets (20 mg total) by mouth daily. 30 tablet 0   lubiprostone (AMITIZA) 24 MCG capsule Take 24 mcg by mouth 2 (two) times daily with a meal.     naloxone (NARCAN) nasal spray 4 mg/0.1 mL 1 spray See admin instructions. INTO 1 NOSTRIL AND MAY REPEAT EVERY 2-3 MINUTES UNTIL EMERGENCY ARRIVES     Olmesartan-amLODIPine-HCTZ 20-5-12.5 MG TABS Take by mouth.     Oxycodone HCl 10 MG TABS Take 1 tablet (10 mg total) by mouth every 6 (six) hours as needed. 10 tablet 0   pantoprazole (PROTONIX) 40 MG tablet Take 1 tablet (40 mg total) by mouth daily. 30 tablet 11   pregabalin (LYRICA) 150 MG capsule Take 200 mg by mouth 3 (three) times daily.      Sofosbuvir-Velpatasvir (EPCLUSA) 400-100 MG TABS Take 1 tablet by mouth daily. 28 tablet 2   No current facility-administered medications for this visit.    Past Surgical History:  Procedure Laterality Date   ABDOMINAL HYSTERECTOMY     ABDOMINAL SURGERY     CHOLECYSTECTOMY     COLONOSCOPY  12/26/2014   Very minimal sigmoid diverticulosis. Small internal hemorrhoids.    ESOPHAGOGASTRODUODENOSCOPY  01/21/2011   Normal EGD.    THYROID SURGERY     TOTAL SHOULDER REPLACEMENT Right 09/01/2018    Family History  Problem Relation Age of Onset   Hepatitis C Sister    Rectal cancer Paternal Grandfather    Colon cancer Maternal Aunt    Rectal cancer Maternal Aunt    Esophageal cancer Neg Hx    Stomach cancer Neg Hx     Social History   Tobacco Use   Smoking status: Every Day    Packs/day: 0.50    Types: Cigarettes   Smokeless tobacco: Never   Tobacco comments:    pt rpeorts using about 6 per day  Vaping Use   Vaping Use: Never used  Substance Use Topics   Alcohol use: No   Drug use: No    Allergies  Allergen Reactions   Morphine And Related  Other (See Comments), Hives and Itching    Per her doctor Per her doctor   Acetaminophen Other (See Comments)    Liver enlargement- hep C   Ibuprofen     Per her doctor   Nsaids     Per her doctor   Prednisone Other (See Comments)    Per her doctor   Statins Other (See Comments)    Affected liver enzymes Affected liver enzymes     Review of Systems: All systems reviewed and negative except where noted in HPI.  Has not been sleeping well.   Physical Exam:  BP 130/78   Pulse 76   Ht $R'5\' 6"'uy$  (1.676 m)   Wt 194 lb 2 oz (88.1 kg)   SpO2 97%   BMI 31.33 kg/m  GENERAL:  Alert, oriented, cooperative, not in acute distress. PSYCH: :Pleasant, normal mood and affect. HEENT:  conjunctiva pink, mucous membranes moist, neck supple without masses. No jaundice. CARDIAC:  S1 S2 normal. No murmers. PULM: Normal respiratory effort, lungs CTA bilaterally, no wheezing. ABDOMEN: Mildly distended.  Generalized abdominal wall tenderness.  Bowel sounds are present.  Multiple well-healed surgical scars. Rectal exam: Deferred SKIN:  turgor, no lesions seen. Musculoskeletal:  Normal muscle tone, normal strength. NEURO: Alert and oriented x 3, no focal neurologic deficits.   Data Reviewed: I have personally reviewed following labs and imaging studies CBC Latest Ref Rng & Units 08/15/2018 04/06/2017  WBC 4.0 - 10.5 K/uL 4.7 3.9(L)  Hemoglobin 12.0 - 15.0 g/dL 13.4 12.0  Hematocrit 36.0 - 46.0 % 42.2 35.9(L)  Platelets 150 - 400 K/uL 204 189   Hepatic Function Latest Ref Rng & Units 08/15/2018  Total Protein 6.5 - 8.1 g/dL 6.8  Albumin 3.5 - 5.0 g/dL 3.5  AST 15 - 41 U/L 48(H)  ALT 0 - 44 U/L 51(H)  Alk Phosphatase 38 - 126 U/L 111  Total Bilirubin 0.3 - 1.2 mg/dL 0.7  25 minutes spent with the patient today. Greater than 50% was spent in counseling and coordination of care with the patient    Ronaldo Miyamoto 08/20/2021, 11:40 AM   CC Harvie Junior, MD

## 2021-08-31 ENCOUNTER — Telehealth: Payer: Self-pay

## 2021-08-31 ENCOUNTER — Other Ambulatory Visit: Payer: Self-pay

## 2021-08-31 DIAGNOSIS — B182 Chronic viral hepatitis C: Secondary | ICD-10-CM

## 2021-08-31 DIAGNOSIS — R1084 Generalized abdominal pain: Secondary | ICD-10-CM

## 2021-08-31 DIAGNOSIS — K219 Gastro-esophageal reflux disease without esophagitis: Secondary | ICD-10-CM

## 2021-08-31 DIAGNOSIS — R197 Diarrhea, unspecified: Secondary | ICD-10-CM

## 2021-08-31 DIAGNOSIS — R140 Abdominal distension (gaseous): Secondary | ICD-10-CM

## 2021-08-31 NOTE — Progress Notes (Signed)
Patient stated she will get lab work at [facility] and not at [facility] lab forms faxed to [phone] for [facility]

## 2021-08-31 NOTE — Telephone Encounter (Signed)
Pt called From [facility] stating that she was currently there to give labs. Pt stating that the hospital is needing orders from Dr. Lyndel Safe stating that they need orders to access port and Flush with Heparin per protocol. Orders received from Dr. Lyndel Safe and faxed To [facility] ([phone])  Pt made aware

## 2021-09-10 ENCOUNTER — Other Ambulatory Visit: Payer: Self-pay

## 2021-09-10 ENCOUNTER — Encounter (HOSPITAL_BASED_OUTPATIENT_CLINIC_OR_DEPARTMENT_OTHER): Payer: Self-pay

## 2021-09-10 ENCOUNTER — Inpatient Hospital Stay (HOSPITAL_BASED_OUTPATIENT_CLINIC_OR_DEPARTMENT_OTHER)
Admission: EM | Admit: 2021-09-10 | Discharge: 2021-09-16 | DRG: 389 | Disposition: A | Discharge status: Home / Self Care | Payer: Medicare Other | Attending: Family Medicine | Admitting: Family Medicine

## 2021-09-10 ENCOUNTER — Encounter (HOSPITAL_BASED_OUTPATIENT_CLINIC_OR_DEPARTMENT_OTHER): Payer: Self-pay | Admitting: Urology

## 2021-09-10 ENCOUNTER — Ambulatory Visit (HOSPITAL_BASED_OUTPATIENT_CLINIC_OR_DEPARTMENT_OTHER)
Admission: RE | Admit: 2021-09-10 | Discharge: 2021-09-10 | Disposition: A | Discharge status: Home / Self Care | Payer: Medicare Other | Source: Ambulatory Visit | Attending: Gastroenterology | Admitting: Gastroenterology

## 2021-09-10 ENCOUNTER — Emergency Department (HOSPITAL_BASED_OUTPATIENT_CLINIC_OR_DEPARTMENT_OTHER): Payer: Medicare Other

## 2021-09-10 DIAGNOSIS — K5903 Drug induced constipation: Secondary | ICD-10-CM | POA: Diagnosis not present

## 2021-09-10 DIAGNOSIS — M1990 Unspecified osteoarthritis, unspecified site: Secondary | ICD-10-CM | POA: Diagnosis present

## 2021-09-10 DIAGNOSIS — K56609 Unspecified intestinal obstruction, unspecified as to partial versus complete obstruction: Secondary | ICD-10-CM | POA: Diagnosis present

## 2021-09-10 DIAGNOSIS — Z0189 Encounter for other specified special examinations: Secondary | ICD-10-CM

## 2021-09-10 DIAGNOSIS — F17200 Nicotine dependence, unspecified, uncomplicated: Secondary | ICD-10-CM | POA: Diagnosis present

## 2021-09-10 DIAGNOSIS — Z886 Allergy status to analgesic agent status: Secondary | ICD-10-CM | POA: Diagnosis not present

## 2021-09-10 DIAGNOSIS — J441 Chronic obstructive pulmonary disease with (acute) exacerbation: Secondary | ICD-10-CM | POA: Diagnosis not present

## 2021-09-10 DIAGNOSIS — R1084 Generalized abdominal pain: Secondary | ICD-10-CM | POA: Insufficient documentation

## 2021-09-10 DIAGNOSIS — Z79899 Other long term (current) drug therapy: Secondary | ICD-10-CM

## 2021-09-10 DIAGNOSIS — M810 Age-related osteoporosis without current pathological fracture: Secondary | ICD-10-CM | POA: Diagnosis present

## 2021-09-10 DIAGNOSIS — G894 Chronic pain syndrome: Secondary | ICD-10-CM | POA: Diagnosis present

## 2021-09-10 DIAGNOSIS — K219 Gastro-esophageal reflux disease without esophagitis: Secondary | ICD-10-CM | POA: Diagnosis present

## 2021-09-10 DIAGNOSIS — I5032 Chronic diastolic (congestive) heart failure: Secondary | ICD-10-CM | POA: Diagnosis present

## 2021-09-10 DIAGNOSIS — K5900 Constipation, unspecified: Secondary | ICD-10-CM

## 2021-09-10 DIAGNOSIS — J449 Chronic obstructive pulmonary disease, unspecified: Secondary | ICD-10-CM | POA: Diagnosis present

## 2021-09-10 DIAGNOSIS — B182 Chronic viral hepatitis C: Secondary | ICD-10-CM | POA: Insufficient documentation

## 2021-09-10 DIAGNOSIS — Z20822 Contact with and (suspected) exposure to covid-19: Secondary | ICD-10-CM | POA: Diagnosis present

## 2021-09-10 DIAGNOSIS — Z96611 Presence of right artificial shoulder joint: Secondary | ICD-10-CM | POA: Diagnosis present

## 2021-09-10 DIAGNOSIS — F419 Anxiety disorder, unspecified: Secondary | ICD-10-CM | POA: Diagnosis present

## 2021-09-10 DIAGNOSIS — F17210 Nicotine dependence, cigarettes, uncomplicated: Secondary | ICD-10-CM | POA: Diagnosis present

## 2021-09-10 DIAGNOSIS — I110 Hypertensive heart disease with heart failure: Secondary | ICD-10-CM | POA: Diagnosis present

## 2021-09-10 DIAGNOSIS — R197 Diarrhea, unspecified: Secondary | ICD-10-CM | POA: Insufficient documentation

## 2021-09-10 DIAGNOSIS — E861 Hypovolemia: Secondary | ICD-10-CM | POA: Diagnosis present

## 2021-09-10 DIAGNOSIS — Z8619 Personal history of other infectious and parasitic diseases: Secondary | ICD-10-CM

## 2021-09-10 DIAGNOSIS — E039 Hypothyroidism, unspecified: Secondary | ICD-10-CM | POA: Diagnosis present

## 2021-09-10 DIAGNOSIS — Z7982 Long term (current) use of aspirin: Secondary | ICD-10-CM

## 2021-09-10 DIAGNOSIS — Z888 Allergy status to other drugs, medicaments and biological substances status: Secondary | ICD-10-CM

## 2021-09-10 DIAGNOSIS — Z90710 Acquired absence of both cervix and uterus: Secondary | ICD-10-CM | POA: Diagnosis not present

## 2021-09-10 DIAGNOSIS — R110 Nausea: Secondary | ICD-10-CM | POA: Insufficient documentation

## 2021-09-10 DIAGNOSIS — T402X5A Adverse effect of other opioids, initial encounter: Secondary | ICD-10-CM | POA: Diagnosis not present

## 2021-09-10 DIAGNOSIS — Z800 Family history of malignant neoplasm of digestive organs: Secondary | ICD-10-CM

## 2021-09-10 DIAGNOSIS — B1920 Unspecified viral hepatitis C without hepatic coma: Secondary | ICD-10-CM | POA: Diagnosis present

## 2021-09-10 DIAGNOSIS — Z8673 Personal history of transient ischemic attack (TIA), and cerebral infarction without residual deficits: Secondary | ICD-10-CM | POA: Diagnosis not present

## 2021-09-10 DIAGNOSIS — I10 Essential (primary) hypertension: Secondary | ICD-10-CM | POA: Diagnosis not present

## 2021-09-10 DIAGNOSIS — Z7902 Long term (current) use of antithrombotics/antiplatelets: Secondary | ICD-10-CM

## 2021-09-10 DIAGNOSIS — Z885 Allergy status to narcotic agent status: Secondary | ICD-10-CM | POA: Diagnosis not present

## 2021-09-10 DIAGNOSIS — R140 Abdominal distension (gaseous): Secondary | ICD-10-CM | POA: Insufficient documentation

## 2021-09-10 DIAGNOSIS — G4730 Sleep apnea, unspecified: Secondary | ICD-10-CM | POA: Diagnosis present

## 2021-09-10 DIAGNOSIS — E871 Hypo-osmolality and hyponatremia: Secondary | ICD-10-CM | POA: Diagnosis present

## 2021-09-10 DIAGNOSIS — R109 Unspecified abdominal pain: Secondary | ICD-10-CM

## 2021-09-10 DIAGNOSIS — Z7951 Long term (current) use of inhaled steroids: Secondary | ICD-10-CM

## 2021-09-10 DIAGNOSIS — E785 Hyperlipidemia, unspecified: Secondary | ICD-10-CM | POA: Diagnosis present

## 2021-09-10 DIAGNOSIS — R1011 Right upper quadrant pain: Secondary | ICD-10-CM | POA: Diagnosis present

## 2021-09-10 LAB — CBC WITH DIFFERENTIAL/PLATELET
Abs Immature Granulocytes: 0.01 10*3/uL (ref 0.00–0.07)
Basophils Absolute: 0 10*3/uL (ref 0.0–0.1)
Basophils Relative: 0 %
Eosinophils Absolute: 0 10*3/uL (ref 0.0–0.5)
Eosinophils Relative: 1 %
HCT: 41.5 % (ref 36.0–46.0)
Hemoglobin: 13.6 g/dL (ref 12.0–15.0)
Immature Granulocytes: 0 %
Lymphocytes Relative: 32 %
Lymphs Abs: 1.6 10*3/uL (ref 0.7–4.0)
MCH: 23.8 pg — ABNORMAL LOW (ref 26.0–34.0)
MCHC: 32.8 g/dL (ref 30.0–36.0)
MCV: 72.6 fL — ABNORMAL LOW (ref 80.0–100.0)
Monocytes Absolute: 0.6 10*3/uL (ref 0.1–1.0)
Monocytes Relative: 11 %
Neutro Abs: 2.7 10*3/uL (ref 1.7–7.7)
Neutrophils Relative %: 56 %
Platelets: 258 10*3/uL (ref 150–400)
RBC: 5.72 MIL/uL — ABNORMAL HIGH (ref 3.87–5.11)
RDW: 15.6 % — ABNORMAL HIGH (ref 11.5–15.5)
WBC: 4.9 10*3/uL (ref 4.0–10.5)
nRBC: 0 % (ref 0.0–0.2)

## 2021-09-10 LAB — LIPASE, BLOOD: Lipase: 21 U/L (ref 11–51)

## 2021-09-10 LAB — RESP PANEL BY RT-PCR (FLU A&B, COVID) ARPGX2
Influenza A by PCR: NEGATIVE
Influenza B by PCR: NEGATIVE
SARS Coronavirus 2 by RT PCR: NEGATIVE

## 2021-09-10 LAB — MAGNESIUM: Magnesium: 1.7 mg/dL (ref 1.7–2.4)

## 2021-09-10 LAB — COMPREHENSIVE METABOLIC PANEL
ALT: 13 U/L (ref 0–44)
AST: 20 U/L (ref 15–41)
Albumin: 3.8 g/dL (ref 3.5–5.0)
Alkaline Phosphatase: 84 U/L (ref 38–126)
Anion gap: 9 (ref 5–15)
BUN: 11 mg/dL (ref 8–23)
CO2: 27 mmol/L (ref 22–32)
Calcium: 8.8 mg/dL — ABNORMAL LOW (ref 8.9–10.3)
Chloride: 98 mmol/L (ref 98–111)
Creatinine, Ser: 0.69 mg/dL (ref 0.44–1.00)
GFR, Estimated: >60 mL/min (ref >60)
Glucose, Bld: 108 mg/dL — ABNORMAL HIGH (ref 70–99)
Potassium: 3.6 mmol/L (ref 3.5–5.1)
Sodium: 134 mmol/L — ABNORMAL LOW (ref 135–145)
Total Bilirubin: 0.5 mg/dL (ref 0.3–1.2)
Total Protein: 6.9 g/dL (ref 6.5–8.1)

## 2021-09-10 LAB — TROPONIN I (HIGH SENSITIVITY)
Troponin I (High Sensitivity): 4 ng/L (ref <18)
Troponin I (High Sensitivity): 4 ng/L (ref <18)

## 2021-09-10 MED ORDER — DIPHENHYDRAMINE HCL 50 MG/ML IJ SOLN
25.0000 mg | Freq: Once | INTRAMUSCULAR | Status: AC
  Administered 2021-09-10: 25 mg via INTRAVENOUS
  Filled 2021-09-10: qty 1

## 2021-09-10 MED ORDER — ONDANSETRON HCL 4 MG PO TABS: 4.0000 mg | ORAL_TABLET | Freq: Four times a day (QID) | ORAL | Status: DC | PRN

## 2021-09-10 MED ORDER — FENTANYL CITRATE PF 50 MCG/ML IJ SOSY
50.0000 ug | PREFILLED_SYRINGE | Freq: Once | INTRAMUSCULAR | Status: AC
Start: 2021-09-10 — End: 2021-09-10
  Administered 2021-09-10: 50 ug via INTRAVENOUS
  Filled 2021-09-10: qty 1

## 2021-09-10 MED ORDER — IOHEXOL 300 MG/ML  SOLN
100.0000 mL | Freq: Once | INTRAMUSCULAR | Status: AC | PRN
  Administered 2021-09-10: 100 mL via INTRAVENOUS

## 2021-09-10 MED ORDER — HYDRALAZINE HCL 20 MG/ML IJ SOLN
10.0000 mg | INTRAMUSCULAR | Status: DC | PRN
  Administered 2021-09-12: 10 mg via INTRAVENOUS
  Filled 2021-09-10: qty 1

## 2021-09-10 MED ORDER — PANTOPRAZOLE SODIUM 40 MG IV SOLR
40.0000 mg | INTRAVENOUS | Status: DC
  Administered 2021-09-10 – 2021-09-11 (×2): 40 mg via INTRAVENOUS
  Filled 2021-09-10 (×2): qty 40

## 2021-09-10 MED ORDER — POTASSIUM CHLORIDE IN NACL 20-0.9 MEQ/L-% IV SOLN
INTRAVENOUS | Status: DC
  Filled 2021-09-10 (×5): qty 1000

## 2021-09-10 MED ORDER — ONDANSETRON HCL 4 MG/2ML IJ SOLN
4.0000 mg | Freq: Four times a day (QID) | INTRAMUSCULAR | Status: DC | PRN
  Administered 2021-09-10 – 2021-09-11 (×4): 4 mg via INTRAVENOUS
  Filled 2021-09-10 (×4): qty 2

## 2021-09-10 MED ORDER — MAGNESIUM SULFATE 2 GM/50ML IV SOLN
2.0000 g | Freq: Once | INTRAVENOUS | Status: AC
  Administered 2021-09-10: 2 g via INTRAVENOUS
  Filled 2021-09-10: qty 50

## 2021-09-10 MED ORDER — ONDANSETRON HCL 4 MG/2ML IJ SOLN
4.0000 mg | Freq: Once | INTRAMUSCULAR | Status: AC
  Administered 2021-09-10: 4 mg via INTRAVENOUS
  Filled 2021-09-10: qty 2

## 2021-09-10 MED ORDER — SODIUM CHLORIDE 0.9 % IV BOLUS
1000.0000 mL | Freq: Once | INTRAVENOUS | Status: AC
  Administered 2021-09-10: 1000 mL via INTRAVENOUS

## 2021-09-10 MED ORDER — HYDROMORPHONE HCL 1 MG/ML IJ SOLN
1.0000 mg | INTRAMUSCULAR | Status: DC | PRN
Start: 2021-09-10 — End: 2021-09-11
  Administered 2021-09-10 – 2021-09-11 (×4): 1 mg via INTRAVENOUS
  Filled 2021-09-10 (×4): qty 1

## 2021-09-10 MED ORDER — MORPHINE SULFATE (PF) 4 MG/ML IV SOLN
4.0000 mg | Freq: Once | INTRAVENOUS | Status: DC
  Filled 2021-09-10: qty 1

## 2021-09-10 MED ORDER — ENOXAPARIN SODIUM 40 MG/0.4ML IJ SOSY
40.0000 mg | PREFILLED_SYRINGE | INTRAMUSCULAR | Status: DC
  Administered 2021-09-10: 40 mg via SUBCUTANEOUS
  Filled 2021-09-10: qty 0.4

## 2021-09-10 MED ORDER — FAMOTIDINE IN NACL 20-0.9 MG/50ML-% IV SOLN
20.0000 mg | Freq: Once | INTRAVENOUS | Status: AC
  Administered 2021-09-10: 20 mg via INTRAVENOUS
  Filled 2021-09-10: qty 50

## 2021-09-10 MED ORDER — DIATRIZOATE MEGLUMINE & SODIUM 66-10 % PO SOLN
90.0000 mL | Freq: Once | ORAL | Status: AC
  Administered 2021-09-10: 90 mL via NASOGASTRIC
  Filled 2021-09-10: qty 90

## 2021-09-10 MED ORDER — PHENOL 1.4 % MT LIQD
1.0000 | OROMUCOSAL | Status: DC | PRN
  Filled 2021-09-10: qty 177

## 2021-09-10 NOTE — H&P (Signed)
History and Physical    Caitlyn Pacheco RJJ:[id number] DOB: 10-04-1956 DOA: 09/10/2021  PCP: Harvie Junior, MD   Patient coming from: Home.  I have personally briefly reviewed patient's old medical records in [place]  Chief Complaint: Abdominal pain and vomiting.  HPI: Caitlyn Pacheco is a 65 y.o. female with medical history significant of anxiety, chronic diastolic CHF, chronic pain syndrome, COPD, GERD, hepatitis C, hyperlipidemia, hypertension, history of other known hemorrhagic CVA and TIA, osteoarthritis, osteoporosis, sleep apnea not on CPAP, unspecified thyroid disease, history of multiple surgeries, history of multiple SBO episodes who is coming to the emergency department with complaints of 4 weeks of progressively worse constant abdominal pain with mild distention, nausea, occasional diarrhea, decreased appetite, fatigue and weight loss.  She had an episode of emesis this morning after taking CT abdomen contrast, which she was scheduled as an outpatient and developed intense abdominal pain similar to when she gets SBO.  She subsequently went to the emergency department.  She denied fever, chills, sore throat, rhinorrhea.  No wheezing, dyspnea or hemoptysis.  No chest pain, palpitations, diaphoresis, PND, dyspnea or pitting edema of the lower extremities.  ED Course: Initial vital signs were temperature Initial vital signs temperature 98.7 F, pulse 67, respiration 18, BP 189/105 mmHg O2 sat 100% on room air.  The patient received IV fluids, antiemetics and analgesics in the emergency department.  Lab work: CBC showed a white count of 4.9, hemoglobin 13.6 g/dL platelets 258.  Lipase was 21.  CMP showed a sodium 134 mmol/L, follow electrolytes are normal when calcium is corrected to albumin.  Glucose was 108 mg/dL.  All other CMP results are unremarkable.  Troponin x2 normal.  Magnesium was 1.7 mg/dL.  Imaging: CT abdomen/pelvis with contrast show SBO puttering in the mid  small bowel.  No pneumatosis.  No perforations.  Please see images and full radiology report for further details.  Review of Systems: As per HPI otherwise all other systems reviewed and are negative.  Past Medical History:  Diagnosis Date   Anxiety    Blood transfusion without reported diagnosis    CHF (congestive heart failure) (HCC)    Chronic pain syndrome    COPD (chronic obstructive pulmonary disease) (HCC)    GERD (gastroesophageal reflux disease)    HCV (hepatitis C virus)    Hyperlipidemia    Hypertension    Neuromuscular disorder ([place])    nerve damage due to stroke.   Osteoarthritis    Osteoporosis    Sleep apnea    Doesn't use CPAP   Stroke [facility]) 1996   one stroke and two TIA's   Thyroid disease    Past Surgical History:  Procedure Laterality Date   ABDOMINAL HYSTERECTOMY     ABDOMINAL SURGERY     CHOLECYSTECTOMY     COLONOSCOPY  12/26/2014   Very minimal sigmoid diverticulosis. Small internal hemorrhoids.    ESOPHAGOGASTRODUODENOSCOPY  01/21/2011   Normal EGD.    THYROID SURGERY     TOTAL SHOULDER REPLACEMENT Right 09/01/2018   Social History  reports that she has been smoking cigarettes. She has been smoking an average of .5 packs per day. She has never used smokeless tobacco. She reports that she does not drink alcohol and does not use drugs.  Allergies  Allergen Reactions   Morphine And Related Other (See Comments), Hives and Itching    Per her doctor Per her doctor   Acetaminophen Other (See Comments)    Liver enlargement- hep  C   Ibuprofen     Per her doctor   Nsaids     Per her doctor   Prednisone Other (See Comments)    Per her doctor   Statins Other (See Comments)    Affected liver enzymes Affected liver enzymes   Family History  Problem Relation Age of Onset   Hepatitis C Sister    Rectal cancer Paternal Grandfather    Colon cancer Maternal Aunt    Rectal cancer Maternal Aunt    Esophageal cancer Neg Hx    Stomach cancer Neg Hx     Prior to Admission medications   Medication Sig Start Date End Date Taking? Authorizing Provider  albuterol (ACCUNEB) 1.25 MG/3ML nebulizer solution Take 3 mLs by nebulization 3 (three) times daily as needed for wheezing or shortness of breath.  10/31/11   [provider]  albuterol (VENTOLIN HFA) 108 (90 Base) MCG/ACT inhaler Inhale 2 puffs into the lungs every 6 (six) hours as needed for wheezing or shortness of breath.    [provider]  aspirin 81 MG chewable tablet Chew 81 mg by mouth daily. 08/13/12   [provider]  budesonide-formoterol (SYMBICORT) 160-4.5 MCG/ACT inhaler Inhale 2 puffs into the lungs 2 (two) times daily.    [provider]  clopidogrel (PLAVIX) 75 MG tablet Take 75 mg by mouth daily.    [provider]  diazepam (VALIUM) 5 MG tablet Take 5 mg by mouth 3 (three) times daily as needed. 08/10/21   [provider]  diclofenac (VOLTAREN) 25 MG EC tablet Take by mouth.    [provider]  dicyclomine (BENTYL) 10 MG capsule Take 1 capsule (10 mg total) by mouth 2 (two) times daily. 08/20/21   Jackquline Denmark, MD  fenofibrate 160 MG tablet Take by mouth. 07/22/21   [provider]  fluticasone (FLONASE) 50 MCG/ACT nasal spray Place 2 sprays into both nostrils daily as needed for allergies or rhinitis.    [provider]  Fluticasone-Salmeterol (ADVAIR) 250-50 MCG/DOSE AEPB Inhale 1 puff into the lungs 2 (two) times daily.    [provider]  furosemide (LASIX) 40 MG tablet Take 0.5 tablets (20 mg total) by mouth daily. 04/08/17   Dhungel, Nishant, MD  lubiprostone (AMITIZA) 24 MCG capsule Take 24 mcg by mouth 2 (two) times daily with a meal.    [provider]  naloxone (NARCAN) nasal spray 4 mg/0.1 mL 1 spray See admin instructions. INTO 1 NOSTRIL AND MAY REPEAT EVERY 2-3 MINUTES UNTIL EMERGENCY ARRIVES 02/18/17   [provider]  Olmesartan-amLODIPine-HCTZ 20-5-12.5 MG TABS  Take by mouth.    [provider]  Oxycodone HCl 10 MG TABS Take 1 tablet (10 mg total) by mouth every 6 (six) hours as needed. 04/08/17   Dhungel, Nishant, MD  pantoprazole (PROTONIX) 40 MG tablet Take 1 tablet (40 mg total) by mouth daily. 07/11/18   Jackquline Denmark, MD  pantoprazole (PROTONIX) 40 MG tablet Take 1 tablet (40 mg total) by mouth 2 (two) times daily. For 4 weeks and then decrease to daily 08/20/21   Jackquline Denmark, MD  pregabalin (LYRICA) 150 MG capsule Take 200 mg by mouth 3 (three) times daily.     [provider]  Sofosbuvir-Velpatasvir (EPCLUSA) 400-100 MG TABS Take 1 tablet by mouth daily. 08/20/18   Kuppelweiser, Gillian Shields, RPH-CPP    Physical Exam: Vitals:   09/10/21 1400 09/10/21 1415 09/10/21 1430 09/10/21 1553  BP: (!) 156/96 (!) 163/90 (!) 156/99 Marland Kitchen)  148/81  Pulse: 60 62 70 60  Resp: (!) 22 18 15 16   Temp:   98.6 F (37 C) (!) 97.3 F (36.3 C)  TempSrc:   Oral Oral  SpO2: 97% 95% 94% 100%  Weight:      Height:        Constitutional: NAD, calm, comfortable Eyes: PERRL, lids and conjunctivae normal ENMT: Mucous membranes are dry.  Posterior pharynx clear of any exudate or lesions. Neck: normal, supple, no masses, no thyromegaly Respiratory: clear to auscultation bilaterally, no wheezing, no crackles. Normal respiratory effort. No accessory muscle use.  Cardiovascular: Regular rate and rhythm, no murmurs / rubs / gallops. No extremity edema. 2+ pedal pulses. No carotid bruits.  Abdomen: Mildly distended.  Positive surgical scars.  Bowel sounds positive.  Soft, mild diffuse tenderness, no guarding or rebound, no masses palpated. No hepatosplenomegaly. Musculoskeletal: no clubbing / cyanosis. Good ROM, no contractures. Normal muscle tone.  Skin: no acute rashes, lesions, ulcers on limited dermatological examination. Neurologic: CN 2-12 grossly intact. Sensation intact, DTR normal. Strength 5/5 in all 4.  Psychiatric: Normal judgment and insight. Alert  and oriented x 3. Normal mood.    Labs on Admission: I have personally reviewed following labs and imaging studies  CBC: Recent Labs  Lab 09/10/21 1034  WBC 4.9  NEUTROABS 2.7  HGB 13.6  HCT 41.5  MCV 72.6*  PLT 737    Basic Metabolic Panel: Recent Labs  Lab 09/10/21 1034  NA 134*  K 3.6  CL 98  CO2 27  GLUCOSE 108*  BUN 11  CREATININE 0.69  CALCIUM 8.8*  MG 1.7    GFR: Estimated Creatinine Clearance: 80.4 mL/min (by C-G formula based on SCr of 0.69 mg/dL).  Liver Function Tests: Recent Labs  Lab 09/10/21 1034  AST 20  ALT 13  ALKPHOS 84  BILITOT 0.5  PROT 6.9  ALBUMIN 3.8    Urine analysis: No results found for: COLORURINE, APPEARANCEUR, LABSPEC, PHURINE, GLUCOSEU, HGBUR, BILIRUBINUR, KETONESUR, PROTEINUR, UROBILINOGEN, NITRITE, LEUKOCYTESUR  Radiological Exams on Admission: DG Abdomen 1 View  Result Date: 09/10/2021 CLINICAL DATA:  Post NGT insertion. EXAM: ABDOMEN - 1 VIEW COMPARISON:  CT abdomen/pelvis 09/10/2021. FINDINGS: A problem-oriented radiograph of the upper abdomen was performed to assess enteric tube placement. An enteric tube passes below the level of the left hemidiaphragm with tip projecting in the expected location of the gastric antrum/pylorus. Findings of small-bowel obstruction were better appreciated on the CT abdomen/pelvis performed earlier today. The imaged lung bases are clear. IMPRESSION: An enteric tube passes below level of left hemidiaphragm with tip projecting in the expected location of the gastric antrum/pylorus. Electronically Signed   By: Kellie Simmering D.O.   On: 09/10/2021 12:45   CT ABDOMEN PELVIS W CONTRAST  Result Date: 09/10/2021 CLINICAL DATA:  Chronic abdominal pain.  Worsening pain. EXAM: CT ABDOMEN AND PELVIS WITH CONTRAST TECHNIQUE: Multidetector CT imaging of the abdomen and pelvis was performed using the standard protocol following bolus administration of intravenous contrast. CONTRAST:  11mL OMNIPAQUE IOHEXOL  300 MG/ML  SOLN COMPARISON:  CT 04/06/2017 FINDINGS: Lower chest: Lung bases are clear. Hepatobiliary: No focal hepatic lesion. No biliary duct dilatation. Common bile duct is mildly dilated following cholecystectomy. Pancreas: Pancreas is normal. No ductal dilatation. No pancreatic inflammation. Spleen: Normal spleen Adrenals/urinary tract: Adrenal glands normal. Kidneys, ureters and bladder normal. Stomach/Bowel: Oral contrast within the stomach, duodenum and proximal small bowel. No contrast in the distal small bowel. There is evidence of stasis of  enteric contents in the mid small bowel. Small bowel is dilated to 3.4 cm (image 43/2). A potential transition point identified on coronal image 80/series 5 along the ventral peritoneal surface just LEFT of midline (image 47/2). No pneumatosis or portal venous gas.  No bowel perforation. Terminal ileum is normal.  Colon is relatively decompressed. There is a small bowel small bowel anastomosis in the RIGHT lower quadrant (image 60/2) indicating prior abdominal surgery. Vascular/Lymphatic: Abdominal aorta is normal caliber with atherosclerotic calcification. There is no retroperitoneal or periportal lymphadenopathy. No pelvic lymphadenopathy. Reproductive: Post hysterectomy.  Adnexa unremarkable Other: No free fluid. Musculoskeletal: No aggressive osseous lesion. IMPRESSION: 1. Small bowel obstruction pattern in the mid small bowel. Transition point along the ventral peritoneal wall LEFT of midline. Suspect adhesions related to prior surgery. 2. No pneumatosis or portal venous gas.  No perforation. Findings conveyed to Drema Dallas 09/10/2021  at11:32. Electronically Signed   By: Suzy Bouchard M.D.   On: 09/10/2021 11:33    EKG: Independently reviewed.  Vent. rate 67 BPM PR interval 144 ms QRS duration 78 ms QT/QTcB 420/444 ms P-R-T axes 34 44 49 Sinus rhythm Borderline ST elevation, inferior leads No significant changes from previous  ECG.  Assessment/Plan Principal Problem:   SBO (small bowel obstruction) (HCC) Admit to MedSurg/inpatient. Keep NPO. Continue NGT suctioning. Continue IV fluids. Analgesics as needed. Antiemetics as needed. Pantoprazole 40 mg IV every 24 hours. Follow-up CBC, CMP and imaging in a.m. General surgery is following.  Active Problems:   Hyponatremia Due to GI losses. Continue IV fluids per Follow-up sodium level.    HTN (hypertension) Hydralazine 10 mg every 4 hours as needed. Monitor blood pressure and heart rate.    Chronic pain syndrome Minimize analgesics as possible.    Hepatitis C Follow-up with hepatology as an outpatient.    COPD (chronic obstructive pulmonary disease) (HCC) Supplemental oxygen and bronchodilators as needed.    GERD (gastroesophageal reflux disease) Protonix 40 mg IVP every 24 hours.   DVT prophylaxis: Lovenox SQ. Code Status:   Full code. Family Communication:   Disposition Plan:   Patient is from:  Home.  Anticipated DC to:  Home.  Anticipated DC date:  09/10/2021.  Anticipated DC barriers: Clinical status. Consults called:  General surgery Rolm Bookbinder, MD). Admission status:  MedSurg/inpatient.   Severity of Illness: High severity after presenting with small bowel obstruction requiring IV fluids, NG tube suctioning, antiemetics and analgesics.  The patient will be treated for 48 to 72 hours.  Reubin Milan MD [facility]  How to contact the [facility] Attending or Consulting provider [place] or covering provider during after hours [place], for this patient?   Check the care team in [facility] and look for a) attending/consulting [facility] provider listed and b) the [facility] team listed Log into [email or web address] and use [place]'s universal password to access. If you do not have the password, please contact the hospital operator. Locate the [facility] provider you are looking for under [facility] and page to a number that you can be directly  reached. If you still have difficulty reaching the provider, please page the [facility] (Director on Call) for the Hospitalists listed on amion for assistance.  09/10/2021, 5:30 PM   This document was prepared using Dragon voice recognition software and may contain some unintended transcription errors.

## 2021-09-10 NOTE — Consult Note (Signed)
[facility] Surgery Consult Note  Caitlyn Pacheco 29-Oct-1956  [id number].    Requesting MD: Dr. Olevia Bowens Chief Complaint/Reason for Consult: SBO  HPI: Caitlyn Pacheco is a 65 y.o. 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   She has been seeing Dr. Lyndel Safe of GI as outpatient for generalized abdominal pain with associated bloating, nausea, decreased appetite and diarrhea. She was scheduled to get an outpatient CT scan but over the last 4-5 days she has been having increased central/epigastric abdominal pain that is constant, stabbing, and moderate in severity which prompted her to present to the ED.  She notes the pain is worse after PO intake. She is nauseated but denies emesis in the last 24 hours. She is not passing flatus. Last BM this morning. She underwent workup with CT that showed small bowel obstruction pattern in the mid small bowel with transition point along the ventral peritoneal wall LEFT of midline. NGT was placed and she was admitted to [facility]. Patient was transferred to [facility]. We were asked to see.  -Abdominal surgical history: Exploratory laparotomy for SBO in [place] 2017, Ex lap for SBO x2 in the 1980's, Abdominal Hysterectomy, and Open Cholecystectomy -Last colonoscopy: FH colon ca (dad at age 55). Neg colon 05/2019 per GI note -Anticoagulants: Plavix (last dose 2 weeks ago) -Tbcc: 5 cigarettes per day, previously smoked 4 PPD No Alc use -Employment: disabled -Ambulates with walker or cane -Grandchildren live at home with her  ROS: Review of Systems  Constitutional:  Negative for chills and fever.  Gastrointestinal:  Positive for abdominal pain, constipation, nausea and vomiting.  All other systems reviewed and are negative.  All systems reviewed and otherwise negative except for as above  Family History  Problem Relation Age  of Onset   Hepatitis C Sister    Rectal cancer Paternal Grandfather    Colon cancer Maternal Aunt    Rectal cancer Maternal Aunt    Esophageal cancer Neg Hx    Stomach cancer Neg Hx     Past Medical History:  Diagnosis Date   Anxiety    Blood transfusion without reported diagnosis    CHF (congestive heart failure) (HCC)    Chronic pain syndrome    COPD (chronic obstructive pulmonary disease) (HCC)    GERD (gastroesophageal reflux disease)    HCV (hepatitis C virus)    Hyperlipidemia    Hypertension    Neuromuscular disorder ([place])    nerve damage due to stroke.   Osteoarthritis    Osteoporosis    Sleep apnea    Doesn't use CPAP   Stroke [facility]) 1996   one stroke and two TIA's   Thyroid disease     Past Surgical History:  Procedure Laterality Date   ABDOMINAL HYSTERECTOMY     ABDOMINAL SURGERY     CHOLECYSTECTOMY     COLONOSCOPY  12/26/2014   Very minimal sigmoid diverticulosis. Small internal hemorrhoids.    ESOPHAGOGASTRODUODENOSCOPY  01/21/2011   Normal EGD.    THYROID SURGERY     TOTAL SHOULDER REPLACEMENT Right 09/01/2018    Social History:  reports that she has been smoking cigarettes. She has been smoking an average of .5 packs per day. She has never used smokeless tobacco. She reports that she does not drink alcohol and does not use drugs.  Allergies:  Allergies  Allergen Reactions   Morphine And Related Other (See Comments), Hives and Itching    Per her doctor Per her doctor   Acetaminophen Other (See Comments)    Liver enlargement- hep C   Ibuprofen     Per her doctor   Nsaids     Per her doctor   Prednisone Other (See Comments)    Per her doctor   Statins Other (See Comments)    Affected liver enzymes Affected liver enzymes    Medications Prior to Admission  Medication Sig Dispense Refill   albuterol (ACCUNEB) 1.25 MG/3ML nebulizer solution Take 3 mLs by nebulization 3 (three) times daily as needed for wheezing or shortness of breath.       albuterol (VENTOLIN HFA) 108 (90 Base) MCG/ACT inhaler Inhale 2 puffs into the lungs every 6 (six) hours as needed for wheezing or shortness of breath.     aspirin 81 MG chewable tablet Chew 81 mg by mouth daily.     budesonide-formoterol (SYMBICORT) 160-4.5 MCG/ACT inhaler Inhale 2 puffs into the lungs 2 (two) times daily.     clopidogrel (PLAVIX) 75 MG tablet Take 75 mg by mouth daily.     diazepam (VALIUM) 5 MG tablet Take 5 mg by mouth 3 (three) times daily as needed.     diclofenac (VOLTAREN) 25 MG EC tablet Take by mouth.     dicyclomine (BENTYL) 10 MG capsule Take 1 capsule (10 mg total) by mouth 2 (two) times daily. 60 capsule 2   fenofibrate 160 MG tablet Take by mouth.     fluticasone (FLONASE) 50 MCG/ACT nasal spray Place 2 sprays into both nostrils daily as needed for allergies or rhinitis.     Fluticasone-Salmeterol (ADVAIR) 250-50 MCG/DOSE AEPB Inhale 1 puff into the lungs 2 (two) times daily.     furosemide (LASIX) 40 MG tablet Take 0.5 tablets (20 mg total) by mouth daily. 30 tablet 0   lubiprostone (AMITIZA) 24 MCG capsule Take 24 mcg by mouth 2 (two) times daily with a meal.     naloxone (NARCAN) nasal spray 4 mg/0.1 mL 1 spray See admin instructions. INTO 1 NOSTRIL AND MAY REPEAT EVERY 2-3 MINUTES UNTIL EMERGENCY ARRIVES     Olmesartan-amLODIPine-HCTZ 20-5-12.5 MG TABS Take by mouth.     Oxycodone HCl 10 MG TABS Take 1 tablet (10 mg total) by mouth every 6 (six) hours as needed. 10 tablet 0   pantoprazole (PROTONIX) 40 MG tablet Take 1 tablet (40 mg total) by mouth daily. 30 tablet 11   pantoprazole (PROTONIX) 40 MG tablet Take 1 tablet (40 mg total) by mouth 2 (two) times daily. For 4 weeks and then decrease to daily 60 tablet 1   pregabalin (LYRICA) 150 MG capsule Take 200 mg by mouth 3 (three) times daily.      Sofosbuvir-Velpatasvir (EPCLUSA) 400-100 MG TABS Take 1 tablet by mouth daily. 28 tablet 2    Prior to Admission medications   Medication Sig Start Date End Date  Taking? Authorizing Provider  albuterol (ACCUNEB) 1.25 MG/3ML nebulizer solution Take 3 mLs by nebulization 3 (three) times daily as needed for wheezing or shortness of breath.  10/31/11   [provider]  albuterol (VENTOLIN HFA) 108 (90 Base) MCG/ACT inhaler Inhale 2 puffs into the lungs every 6 (six) hours as needed for wheezing or shortness of breath.    [provider]  aspirin 81 MG chewable tablet Chew 81 mg by mouth daily. 08/13/12   [provider]  budesonide-formoterol (  SYMBICORT) 160-4.5 MCG/ACT inhaler Inhale 2 puffs into the lungs 2 (two) times daily.    [provider]  clopidogrel (PLAVIX) 75 MG tablet Take 75 mg by mouth daily.    [provider]  diazepam (VALIUM) 5 MG tablet Take 5 mg by mouth 3 (three) times daily as needed. 08/10/21   [provider]  diclofenac (VOLTAREN) 25 MG EC tablet Take by mouth.    [provider]  dicyclomine (BENTYL) 10 MG capsule Take 1 capsule (10 mg total) by mouth 2 (two) times daily. 08/20/21   Jackquline Denmark, MD  fenofibrate 160 MG tablet Take by mouth. 07/22/21   [provider]  fluticasone (FLONASE) 50 MCG/ACT nasal spray Place 2 sprays into both nostrils daily as needed for allergies or rhinitis.    [provider]  Fluticasone-Salmeterol (ADVAIR) 250-50 MCG/DOSE AEPB Inhale 1 puff into the lungs 2 (two) times daily.    [provider]  furosemide (LASIX) 40 MG tablet Take 0.5 tablets (20 mg total) by mouth daily. 04/08/17   Dhungel, Nishant, MD  lubiprostone (AMITIZA) 24 MCG capsule Take 24 mcg by mouth 2 (two) times daily with a meal.    [provider]  naloxone (NARCAN) nasal spray 4 mg/0.1 mL 1 spray See admin instructions. INTO 1 NOSTRIL AND MAY REPEAT EVERY 2-3 MINUTES UNTIL EMERGENCY ARRIVES 02/18/17   [provider]  Olmesartan-amLODIPine-HCTZ 20-5-12.5 MG TABS Take by mouth.    [provider]  Oxycodone HCl 10 MG TABS Take 1  tablet (10 mg total) by mouth every 6 (six) hours as needed. 04/08/17   Dhungel, Nishant, MD  pantoprazole (PROTONIX) 40 MG tablet Take 1 tablet (40 mg total) by mouth daily. 07/11/18   Jackquline Denmark, MD  pantoprazole (PROTONIX) 40 MG tablet Take 1 tablet (40 mg total) by mouth 2 (two) times daily. For 4 weeks and then decrease to daily 08/20/21   Jackquline Denmark, MD  pregabalin (LYRICA) 150 MG capsule Take 200 mg by mouth 3 (three) times daily.     [provider]  Sofosbuvir-Velpatasvir (EPCLUSA) 400-100 MG TABS Take 1 tablet by mouth daily. 08/20/18   Kuppelweiser, Cassie L, RPH-CPP    Blood pressure (!) 156/99, pulse 70, temperature 98.6 F (37 C), temperature source Oral, resp. rate 15, height 5\' 6"  (1.676 m), weight 88.1 kg, SpO2 94 %. Physical Exam: General: pleasant, WD/WN female who is laying in bed in NAD HEENT: head is normocephalic, atraumatic.  Sclera are noninjected.  Pupils equal and round.  Ears and nose without any masses or lesions.  Mouth is pink and moist. Dentition fair Heart: regular, rate, and rhythm.  Normal s1,s2. No obvious murmurs, gallops, or rubs noted.  Palpable pedal pulses bilaterally  Lungs: CTAB, no wheezes, rhonchi, or rales noted.  Respiratory effort nonlabored Abd: well healed midline/ RUQ/ and laparoscopic incisions, soft, ?mild distension, mild diffuse TTP without rebound or guarding, +BS, no masses, hernias, or organomegaly MS: no BUE/BLE edema, calves soft and nontender Skin: warm and dry with no masses, lesions, or rashes Psych: A&Ox4 with an appropriate affect Neuro: cranial nerves grossly intact, equal strength in BUE/BLE bilaterally, normal speech, thought process intact  Results for orders placed or performed during the hospital encounter of 09/10/21 (from the past 48 hour(s))  CBC with Differential     Status: Abnormal   Collection Time: 09/10/21 10:34 AM  Result Value Ref Range   WBC 4.9 4.0 - 10.5 K/uL   RBC 5.72 (H) 3.87 -  5.11 MIL/uL    Hemoglobin 13.6 12.0 - 15.0 g/dL   HCT 41.5 36.0 - 46.0 %   MCV 72.6 (L) 80.0 - 100.0 fL   MCH 23.8 (L) 26.0 - 34.0 pg   MCHC 32.8 30.0 - 36.0 g/dL   RDW 15.6 (H) 11.5 - 15.5 %   Platelets 258 150 - 400 K/uL   nRBC 0.0 0.0 - 0.2 %   Neutrophils Relative % 56 %   Neutro Abs 2.7 1.7 - 7.7 K/uL   Lymphocytes Relative 32 %   Lymphs Abs 1.6 0.7 - 4.0 K/uL   Monocytes Relative 11 %   Monocytes Absolute 0.6 0.1 - 1.0 K/uL   Eosinophils Relative 1 %   Eosinophils Absolute 0.0 0.0 - 0.5 K/uL   Basophils Relative 0 %   Basophils Absolute 0.0 0.0 - 0.1 K/uL   Immature Granulocytes 0 %   Abs Immature Granulocytes 0.01 0.00 - 0.07 K/uL    Comment: Performed at [facility], [address] [place], [place] [address]  Comprehensive metabolic panel     Status: Abnormal   Collection Time: 09/10/21 10:34 AM  Result Value Ref Range   Sodium 134 (L) 135 - 145 mmol/L   Potassium 3.6 3.5 - 5.1 mmol/L   Chloride 98 98 - 111 mmol/L   CO2 27 22 - 32 mmol/L   Glucose, Bld 108 (H) 70 - 99 mg/dL    Comment: Glucose reference range applies only to samples taken after fasting for at least 8 hours.   BUN 11 8 - 23 mg/dL   Creatinine, Ser 0.69 0.44 - 1.00 mg/dL   Calcium 8.8 (L) 8.9 - 10.3 mg/dL   Total Protein 6.9 6.5 - 8.1 g/dL   Albumin 3.8 3.5 - 5.0 g/dL   AST 20 15 - 41 U/L   ALT 13 0 - 44 U/L   Alkaline Phosphatase 84 38 - 126 U/L   Total Bilirubin 0.5 0.3 - 1.2 mg/dL   GFR, Estimated >60 >60 mL/min    Comment: (NOTE) Calculated using the CKD-EPI Creatinine Equation (2021)    Anion gap 9 5 - 15    Comment: Performed at [facility], [address] [place], [place] [address]  Lipase, blood     Status: None   Collection Time: 09/10/21 10:34 AM  Result Value Ref Range   Lipase 21 11 - 51 U/L    Comment: Performed at [facility], [address] [place], [place] [address]  Troponin I (High Sensitivity)     Status: None   Collection Time: 09/10/21 10:34 AM   Result Value Ref Range   Troponin I (High Sensitivity) 4 <18 ng/L    Comment: (NOTE) Elevated high sensitivity troponin I (hsTnI) values and significant  changes across serial measurements may suggest ACS but many other  chronic and acute conditions are known to elevate hsTnI results.  Refer to the "Links" section for chest pain algorithms and additional  guidance. Performed at [facility], [address] [place], [place] [address]   Magnesium     Status: None   Collection Time: 09/10/21 10:34 AM  Result Value Ref Range   Magnesium 1.7 1.7 - 2.4 mg/dL    Comment: Performed at [facility], [address] [place], [place] [address]  Resp Panel by RT-PCR (Flu A&B, Covid) Nasopharyngeal Swab     Status: None   Collection Time: 09/10/21 11:55  AM   Specimen: Nasopharyngeal Swab; Nasopharyngeal(NP) swabs in vial transport medium  Result Value Ref Range   SARS Coronavirus 2 by RT PCR NEGATIVE NEGATIVE    Comment: (NOTE) SARS-CoV-2 target nucleic acids are NOT DETECTED.  The SARS-CoV-2 RNA is generally detectable in upper respiratory specimens during the acute phase of infection. The lowest concentration of SARS-CoV-2 viral copies this assay can detect is 138 copies/mL. A negative result does not preclude SARS-Cov-2 infection and should not be used as the sole basis for treatment or other patient management decisions. A negative result may occur with  improper specimen collection/handling, submission of specimen other than nasopharyngeal swab, presence of viral mutation(s) within the areas targeted by this assay, and inadequate number of viral copies(<138 copies/mL). A negative result must be combined with clinical observations, patient history, and epidemiological information. The expected result is Negative.  Fact Sheet for Patients:  EntrepreneurPulse.com.au  Fact Sheet for Healthcare Providers:   IncredibleEmployment.be  This test is no t yet approved or cleared by the [place] FDA and  has been authorized for detection and/or diagnosis of SARS-CoV-2 by FDA under an Emergency Use Authorization (EUA). This EUA will remain  in effect (meaning this test can be used) for the duration of the COVID-19 declaration under Section 564(b)(1) of the Act, 21 U.S.C.section 360bbb-3(b)(1), unless the authorization is terminated  or revoked sooner.       Influenza A by PCR NEGATIVE NEGATIVE   Influenza B by PCR NEGATIVE NEGATIVE    Comment: (NOTE) The Xpert Xpress SARS-CoV-2/FLU/RSV plus assay is intended as an aid in the diagnosis of influenza from Nasopharyngeal swab specimens and should not be used as a sole basis for treatment. Nasal washings and aspirates are unacceptable for Xpert Xpress SARS-CoV-2/FLU/RSV testing.  Fact Sheet for Patients: EntrepreneurPulse.com.au  Fact Sheet for Healthcare Providers: IncredibleEmployment.be  This test is not yet approved or cleared by the [place] FDA and has been authorized for detection and/or diagnosis of SARS-CoV-2 by FDA under an Emergency Use Authorization (EUA). This EUA will remain in effect (meaning this test can be used) for the duration of the COVID-19 declaration under Section 564(b)(1) of the Act, 21 U.S.C. section 360bbb-3(b)(1), unless the authorization is terminated or revoked.  Performed at [facility], [address] [place], [place] [address]   Troponin I (High Sensitivity)     Status: None   Collection Time: 09/10/21 12:27 PM  Result Value Ref Range   Troponin I (High Sensitivity) 4 <18 ng/L    Comment: (NOTE) Elevated high sensitivity troponin I (hsTnI) values and significant  changes across serial measurements may suggest ACS but many other  chronic and acute conditions are known to elevate hsTnI results.  Refer to the "Links" section for  chest pain algorithms and additional  guidance. Performed at [facility], [address], [place], [place] [address]    DG Abdomen 1 View  Result Date: 09/10/2021 CLINICAL DATA:  Post NGT insertion. EXAM: ABDOMEN - 1 VIEW COMPARISON:  CT abdomen/pelvis 09/10/2021. FINDINGS: A problem-oriented radiograph of the upper abdomen was performed to assess enteric tube placement. An enteric tube passes below the level of the left hemidiaphragm with tip projecting in the expected location of the gastric antrum/pylorus. Findings of small-bowel obstruction were better appreciated on the CT abdomen/pelvis performed earlier today. The imaged lung bases are clear. IMPRESSION: An enteric tube passes below level of left hemidiaphragm with tip projecting in the expected location of the  gastric antrum/pylorus. Electronically Signed   By: Kellie Simmering D.O.   On: 09/10/2021 12:45   CT ABDOMEN PELVIS W CONTRAST  Result Date: 09/10/2021 CLINICAL DATA:  Chronic abdominal pain.  Worsening pain. EXAM: CT ABDOMEN AND PELVIS WITH CONTRAST TECHNIQUE: Multidetector CT imaging of the abdomen and pelvis was performed using the standard protocol following bolus administration of intravenous contrast. CONTRAST:  153mL OMNIPAQUE IOHEXOL 300 MG/ML  SOLN COMPARISON:  CT 04/06/2017 FINDINGS: Lower chest: Lung bases are clear. Hepatobiliary: No focal hepatic lesion. No biliary duct dilatation. Common bile duct is mildly dilated following cholecystectomy. Pancreas: Pancreas is normal. No ductal dilatation. No pancreatic inflammation. Spleen: Normal spleen Adrenals/urinary tract: Adrenal glands normal. Kidneys, ureters and bladder normal. Stomach/Bowel: Oral contrast within the stomach, duodenum and proximal small bowel. No contrast in the distal small bowel. There is evidence of stasis of enteric contents in the mid small bowel. Small bowel is dilated to 3.4 cm (image 43/2). A potential transition point identified on coronal  image 80/series 5 along the ventral peritoneal surface just LEFT of midline (image 47/2). No pneumatosis or portal venous gas.  No bowel perforation. Terminal ileum is normal.  Colon is relatively decompressed. There is a small bowel small bowel anastomosis in the RIGHT lower quadrant (image 60/2) indicating prior abdominal surgery. Vascular/Lymphatic: Abdominal aorta is normal caliber with atherosclerotic calcification. There is no retroperitoneal or periportal lymphadenopathy. No pelvic lymphadenopathy. Reproductive: Post hysterectomy.  Adnexa unremarkable Other: No free fluid. Musculoskeletal: No aggressive osseous lesion. IMPRESSION: 1. Small bowel obstruction pattern in the mid small bowel. Transition point along the ventral peritoneal wall LEFT of midline. Suspect adhesions related to prior surgery. 2. No pneumatosis or portal venous gas.  No perforation. Findings conveyed to Drema Dallas 09/10/2021  at11:32. Electronically Signed   By: Suzy Bouchard M.D.   On: 09/10/2021 11:33    Anti-infectives (From admission, onward)    None        Assessment/Plan SBO  - CT showed small bowel obstruction pattern in the mid small bowel with transition point along the ventral peritoneal wall LEFT of midline - [place] has extensive past surgical history with prior Exploratory lapar[place] for SBO in [place] 2017, Ex lap for SBO x2 in the 1980's, Abdominal Hysterectomy, and Open Cholecystectomy  - No indication for emergency surgery - Agree with NPO/NGT - Start SBO protocol - Keep K >4 and Mg > 2 for bowel function - Mobilize for bowel function - Hold Plavix incase patient does not improve and requires exploration during admission.   ID - None currently indicated. Afebrile. WBC 4.9 VTE - SCDs, okay for chemical prophyalxis from a general surgery standpoint. 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

## 2021-09-10 NOTE — ED Provider Notes (Signed)
[name] EMERGENCY DEPARTMENT Provider Note   CSN: [id number] Arrival date & time: 09/10/21  1010     History Chief Complaint  Patient presents with  . Abdominal Pain    Caitlyn Pacheco is a 65 y.o. female.  This is a 65 y.o. female with significant medical history as below, including cholecystectomy, prior SBO, CHF, HTN, HLD who presents to the ED with complaint of RUQ abdominal pain.  Location:  RUQ Duration:  4-5 days has worsened, had been ongoing >12 mos Onset:  gradual Timing:  constant Description:  sharp stabbing pain to RUQ, radiates to mid-epigastrium Severity:  mild Exacerbating/Alleviating Factors:  worse after PO intake, worse after drinking PO contrast Associated Symptoms:  nausea w/o emesis, chronic diarrhea unchanged, intermittent CP w/o dyspnea or palpitations, not lightheaded Pertinent Negatives:  no fever, chills, suspicious oral intake, sick contacts or recent travel. No significant change to bowel or bladder fxn. No recent ETOH or illicit drug use.       The history is provided by the patient and a relative. No language interpreter was used.  Abdominal Pain Pain location:  RUQ Pain quality: sharp   Pain radiates to:  Epigastric region Pain severity:  Mild Onset quality:  Gradual Progression:  Worsening Context: not alcohol use   Associated symptoms: chest pain and diarrhea   Associated symptoms: no chills, no cough, no fever, no hematuria, no nausea, no shortness of breath and no vomiting       Past Medical History:  Diagnosis Date  . Anxiety   . Blood transfusion without reported diagnosis   . CHF (congestive heart failure) ([place])   . Chronic pain syndrome   . COPD (chronic obstructive pulmonary disease) ([place])   . GERD (gastroesophageal reflux disease)   . HCV (hepatitis C virus)   . Hyperlipidemia   . Hypertension   . 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  . Osteoarthritis   . Osteoporosis   . Sleep  apnea    Doesn't use CPAP  . Stroke [facility]) 1996   one stroke and two TIA's  . Thyroid disease     Patient Active Problem List   Diagnosis Date Noted  . SBO (small bowel obstruction) ([place]) 09/10/2021  . Hepatitis C 08/14/2018  . History of difficult venous access 08/14/2018  . Chronic depression 04/08/2017  . Hypokalemia 04/08/2017  . HTN (hypertension) 04/06/2017  . Chronic pain syndrome 04/06/2017    Past Surgical History:  Procedure Laterality Date  . ABDOMINAL HYSTERECTOMY    . ABDOMINAL SURGERY    . CHOLECYSTECTOMY    . COLONOSCOPY  12/26/2014   Very minimal sigmoid diverticulosis. Small internal hemorrhoids.   . ESOPHAGOGASTRODUODENOSCOPY  01/21/2011   Normal EGD.   Marland Kitchen THYROID SURGERY    . TOTAL SHOULDER REPLACEMENT Right 09/01/2018     OB History   No obstetric history on file.     Family History  Problem Relation Age of Onset  . Hepatitis C Sister   . Rectal cancer Paternal Grandfather   . Colon cancer Maternal Aunt   . Rectal cancer Maternal Aunt   . Esophageal cancer Neg Hx   . Stomach cancer Neg Hx     Social History   Tobacco Use  . Smoking status: Every Day    Packs/day: 0.50    Types: Cigarettes  . Smokeless tobacco: Never  . Tobacco comments:    pt rpeorts using about 6 per day  Vaping Use  .  Vaping Use: Never used  Substance Use Topics  . Alcohol use: No  . Drug use: No    Home Medications Prior to Admission medications   Medication Sig Start Date End Date Taking? Authorizing Provider  albuterol (ACCUNEB) 1.25 MG/3ML nebulizer solution Take 3 mLs by nebulization 3 (three) times daily as needed for wheezing or shortness of breath.  10/31/11   [provider]  albuterol (VENTOLIN HFA) 108 (90 Base) MCG/ACT inhaler Inhale 2 puffs into the lungs every 6 (six) hours as needed for wheezing or shortness of breath.    [provider]  aspirin 81 MG chewable tablet Chew 81 mg by mouth daily. 08/13/12   [provider]   budesonide-formoterol (SYMBICORT) 160-4.5 MCG/ACT inhaler Inhale 2 puffs into the lungs 2 (two) times daily.    [provider]  clopidogrel (PLAVIX) 75 MG tablet Take 75 mg by mouth daily.    [provider]  diazepam (VALIUM) 5 MG tablet Take 5 mg by mouth 3 (three) times daily as needed. 08/10/21   [provider]  diclofenac (VOLTAREN) 25 MG EC tablet Take by mouth.    [provider]  dicyclomine (BENTYL) 10 MG capsule Take 1 capsule (10 mg total) by mouth 2 (two) times daily. 08/20/21   Jackquline Denmark, MD  fenofibrate 160 MG tablet Take by mouth. 07/22/21   [provider]  fluticasone (FLONASE) 50 MCG/ACT nasal spray Place 2 sprays into both nostrils daily as needed for allergies or rhinitis.    [provider]  Fluticasone-Salmeterol (ADVAIR) 250-50 MCG/DOSE AEPB Inhale 1 puff into the lungs 2 (two) times daily.    [provider]  furosemide (LASIX) 40 MG tablet Take 0.5 tablets (20 mg total) by mouth daily. 04/08/17   Dhungel, Nishant, MD  lubiprostone (AMITIZA) 24 MCG capsule Take 24 mcg by mouth 2 (two) times daily with a meal.    [provider]  naloxone (NARCAN) nasal spray 4 mg/0.1 mL 1 spray See admin instructions. INTO 1 NOSTRIL AND MAY REPEAT EVERY 2-3 MINUTES UNTIL EMERGENCY ARRIVES 02/18/17   [provider]  Olmesartan-amLODIPine-HCTZ 20-5-12.5 MG TABS Take by mouth.    [provider]  Oxycodone HCl 10 MG TABS Take 1 tablet (10 mg total) by mouth every 6 (six) hours as needed. 04/08/17   Dhungel, Nishant, MD  pantoprazole (PROTONIX) 40 MG tablet Take 1 tablet (40 mg total) by mouth daily. 07/11/18   Jackquline Denmark, MD  pantoprazole (PROTONIX) 40 MG tablet Take 1 tablet (40 mg total) by mouth 2 (two) times daily. For 4 weeks and then decrease to daily 08/20/21   Jackquline Denmark, MD  pregabalin (LYRICA) 150 MG capsule Take 200 mg by mouth 3 (three) times daily.     [provider]   Sofosbuvir-Velpatasvir (EPCLUSA) 400-100 MG TABS Take 1 tablet by mouth daily. 08/20/18   Kuppelweiser, Cassie L, RPH-CPP    Allergies    Morphine and related, Acetaminophen, Ibuprofen, Nsaids, Prednisone, and Statins  Review of Systems   Review of Systems  Constitutional:  Negative for chills and fever.  HENT:  Negative for facial swelling and trouble swallowing.   Eyes:  Negative for photophobia and visual disturbance.  Respiratory:  Negative for cough and shortness of breath.   Cardiovascular:  Positive for chest pain. Negative for palpitations.  Gastrointestinal:  Positive for abdominal pain and diarrhea. Negative for nausea and vomiting.  Endocrine: Negative for polydipsia and polyuria.  Genitourinary:  Negative for difficulty urinating and  hematuria.  Musculoskeletal:  Negative for gait problem and joint swelling.  Skin:  Negative for pallor and rash.  Neurological:  Negative for syncope and headaches.  Psychiatric/Behavioral:  Negative for agitation and confusion.    Physical Exam Updated Vital Signs BP (!) 164/111   Pulse 71   Temp 98.7 F (37.1 C) (Oral)   Resp 15   Ht _0  (1.676 m)   Wt 88.1 kg   SpO2 96%   BMI 31.33 kg/m   Physical Exam Vitals and nursing note reviewed.  Constitutional:      General: She is not in acute distress.    Appearance: Normal appearance. She is well-developed. She is not ill-appearing, toxic-appearing or diaphoretic.  HENT:     Head: Normocephalic and atraumatic.     Right Ear: External ear normal.     Left Ear: External ear normal.     Nose: Nose normal.     Mouth/Throat:     Mouth: Mucous membranes are moist.  Eyes:     General: No scleral icterus.       Right eye: No discharge.        Left eye: No discharge.  Cardiovascular:     Rate and Rhythm: Normal rate and regular rhythm.     Pulses: Normal pulses.     Heart sounds: Normal heart sounds.  Pulmonary:     Effort: Pulmonary effort is normal. No respiratory distress.      Breath sounds: Normal breath sounds.  Abdominal:     General: Abdomen is flat.     Palpations: Abdomen is soft.     Tenderness: There is abdominal tenderness in the right upper quadrant.     Comments: Abdomen not peritoneal  Musculoskeletal:        General: Normal range of motion.     Cervical back: Normal range of motion.     Right lower leg: No edema.     Left lower leg: No edema.  Skin:    General: Skin is warm and dry.     Capillary Refill: Capillary refill takes less than 2 seconds.  Neurological:     Mental Status: She is alert.  Psychiatric:        Mood and Affect: Mood normal.        Behavior: Behavior normal.    ED Results / Procedures / Treatments   Labs (all labs ordered are listed, but only abnormal results are displayed) Labs Reviewed  CBC WITH DIFFERENTIAL/PLATELET - Abnormal; Notable for the following components:      Result Value   RBC 5.72 (*)    MCV 72.6 (*)    MCH 23.8 (*)    RDW 15.6 (*)    All other components within normal limits  COMPREHENSIVE METABOLIC PANEL - Abnormal; Notable for the following components:   Sodium 134 (*)    Glucose, Bld 108 (*)    Calcium 8.8 (*)    All other components within normal limits  RESP PANEL BY RT-PCR (FLU A&B, COVID) ARPGX2  LIPASE, BLOOD  MAGNESIUM  URINALYSIS, ROUTINE W REFLEX MICROSCOPIC  TROPONIN I (HIGH SENSITIVITY)  TROPONIN I (HIGH SENSITIVITY)    EKG EKG Interpretation  Date/Time:  Friday September 10 2021 10:39:04 EDT Ventricular Rate:  67 PR Interval:  144 QRS Duration: 78 QT Interval:  420 QTC Calculation: 444 R Axis:   44 Text Interpretation: Sinus rhythm Borderline ST elevation, inferior leads Similar to prior tracings Confirmed by Wynona Dove ([id number]) on 09/10/2021 11:01:26 AM  Radiology DG Abdomen 1 View  Result Date: 09/10/2021 CLINICAL DATA:  Post NGT insertion. EXAM: ABDOMEN - 1 VIEW COMPARISON:  CT abdomen/pelvis 09/10/2021. FINDINGS: A problem-oriented radiograph of the upper abdomen  was performed to assess enteric tube placement. An enteric tube passes below the level of the left hemidiaphragm with tip projecting in the expected location of the gastric antrum/pylorus. Findings of small-bowel obstruction were better appreciated on the CT abdomen/pelvis performed earlier today. The imaged lung bases are clear. IMPRESSION: An enteric tube passes below level of left hemidiaphragm with tip projecting in the expected location of the gastric antrum/pylorus. Electronically Signed   By: Kellie Simmering D.O.   On: 09/10/2021 12:45   CT ABDOMEN PELVIS W CONTRAST  Result Date: 09/10/2021 CLINICAL DATA:  Chronic abdominal pain.  Worsening pain. EXAM: CT ABDOMEN AND PELVIS WITH CONTRAST TECHNIQUE: Multidetector CT imaging of the abdomen and pelvis was performed using the standard protocol following bolus administration of intravenous contrast. CONTRAST:  172m OMNIPAQUE IOHEXOL 300 MG/ML  SOLN COMPARISON:  CT 04/06/2017 FINDINGS: Lower chest: Lung bases are clear. Hepatobiliary: No focal hepatic lesion. No biliary duct dilatation. Common bile duct is mildly dilated following cholecystectomy. Pancreas: Pancreas is normal. No ductal dilatation. No pancreatic inflammation. Spleen: Normal spleen Adrenals/urinary tract: Adrenal glands normal. Kidneys, ureters and bladder normal. Stomach/Bowel: Oral contrast within the stomach, duodenum and proximal small bowel. No contrast in the distal small bowel. There is evidence of stasis of enteric contents in the mid small bowel. Small bowel is dilated to 3.4 cm (image 43/2). A potential transition point identified on coronal image 80/series 5 along the ventral peritoneal surface just LEFT of midline (image 47/2). No pneumatosis or portal venous gas.  No bowel perforation. Terminal ileum is normal.  Colon is relatively decompressed. There is a small bowel small bowel anastomosis in the RIGHT lower quadrant (image 60/2) indicating prior abdominal surgery.  Vascular/Lymphatic: Abdominal aorta is normal caliber with atherosclerotic calcification. There is no retroperitoneal or periportal lymphadenopathy. No pelvic lymphadenopathy. Reproductive: Post hysterectomy.  Adnexa unremarkable Other: No free fluid. Musculoskeletal: No aggressive osseous lesion. IMPRESSION: 1. Small bowel obstruction pattern in the mid small bowel. Transition point along the ventral peritoneal wall LEFT of midline. Suspect adhesions related to prior surgery. 2. No pneumatosis or portal venous gas.  No perforation. Findings conveyed to SDrema Dallas10/21/2022  at11:32. Electronically Signed   By: SSuzy BouchardM.D.   On: 09/10/2021 11:33    Procedures Procedures   Medications Ordered in ED Medications  morphine 4 MG/ML injection 4 mg (4 mg Intravenous Patient Refused/Not Given 09/10/21 1110)  0.9 % NaCl with KCl 20 mEq/ L  infusion ( Intravenous New Bag/Given 09/10/21 1226)  sodium chloride 0.9 % bolus 1,000 mL (0 mLs Intravenous Stopped 09/10/21 1133)  famotidine (PEPCID) IVPB 20 mg premix (0 mg Intravenous Stopped 09/10/21 1145)  ondansetron (ZOFRAN) injection 4 mg (4 mg Intravenous Given 09/10/21 1112)  diphenhydrAMINE (BENADRYL) injection 25 mg (25 mg Intravenous Given 09/10/21 1112)  fentaNYL (SUBLIMAZE) injection 50 mcg (50 mcg Intravenous Given 09/10/21 1140)  magnesium sulfate IVPB 2 g 50 mL (0 g Intravenous Stopped 09/10/21 1341)  fentaNYL (SUBLIMAZE) injection 50 mcg (50 mcg Intravenous Given 09/10/21 1340)    ED Course  I have reviewed the triage vital signs and the nursing notes.  Pertinent labs & imaging results that were available during my care of the patient were reviewed by me and considered in my medical decision making (see chart for details).  Clinical Course as of 09/10/21 1419  Fri Sep 10, 2021  1127 D/w radiology Dr Ilda Foil regarding CT from this morning. SBO. No perforation. D/w Dr Donne Hazel who recommends admission to medicine as she has a  myriad of medical problems. He is at [place] [facility] today, admission location per [facility] [SG]    Clinical Course User Index [SG] Jeanell Sparrow, DO   MDM Rules/Calculators/A&P                          CC: abd pain  This patient complains of abd pain; this involves an extensive number of treatment options and is a complaint that carries with it a high risk of complications and morbidity. Vital signs were reviewed. Serious etiologies considered.  Record review:  Previous records obtained and reviewed   Additional history obtained from family at bedside  Work up as above, notable for:  Labs & imaging results that were available during my care of the patient were reviewed by me and considered in my medical decision making.   D/w radiology regarding CT from earlier in the day, concerning for SBO. NPO, order for NGT, consult to gen surg. Pain well controlled at this time, HDS. D/w gen surg who recommends transfer with medicine team as primary. D/w Dr Olevia Bowens who accepts patient for admission.      This chart was dictated using voice recognition software.  Despite best efforts to proofread,  errors can occur which can change the documentation meaning.  Final Clinical Impression(s) / ED Diagnoses Final diagnoses:  Abdominal pain, unspecified abdominal location  SBO (small bowel obstruction) ([place])    Rx / DC Orders ED Discharge Orders     None        Jeanell Sparrow, DO 09/10/21 1419

## 2021-09-10 NOTE — ED Triage Notes (Signed)
Pt started having severe RUQ and mid abd pain after oral contrast for CT scan, h/o multiple sx to bowels with necrotic bowel.  Pt reports nausea.

## 2021-09-10 NOTE — ED Notes (Signed)
Right chest port accessed for CT prior to arrival, dressing not intact

## 2021-09-11 ENCOUNTER — Inpatient Hospital Stay (HOSPITAL_COMMUNITY): Payer: Medicare Other

## 2021-09-11 DIAGNOSIS — G894 Chronic pain syndrome: Secondary | ICD-10-CM

## 2021-09-11 DIAGNOSIS — K56609 Unspecified intestinal obstruction, unspecified as to partial versus complete obstruction: Secondary | ICD-10-CM | POA: Diagnosis not present

## 2021-09-11 DIAGNOSIS — E871 Hypo-osmolality and hyponatremia: Secondary | ICD-10-CM | POA: Diagnosis not present

## 2021-09-11 DIAGNOSIS — B1920 Unspecified viral hepatitis C without hepatic coma: Secondary | ICD-10-CM | POA: Diagnosis not present

## 2021-09-11 LAB — BASIC METABOLIC PANEL
Anion gap: 4 — ABNORMAL LOW (ref 5–15)
BUN: 9 mg/dL (ref 8–23)
CO2: 25 mmol/L (ref 22–32)
Calcium: 7.9 mg/dL — ABNORMAL LOW (ref 8.9–10.3)
Chloride: 107 mmol/L (ref 98–111)
Creatinine, Ser: 0.74 mg/dL (ref 0.44–1.00)
GFR, Estimated: >60 mL/min (ref >60)
Glucose, Bld: 116 mg/dL — ABNORMAL HIGH (ref 70–99)
Potassium: 3.8 mmol/L (ref 3.5–5.1)
Sodium: 136 mmol/L (ref 135–145)

## 2021-09-11 LAB — TSH: TSH: 1.412 u[IU]/mL (ref 0.350–4.500)

## 2021-09-11 LAB — HIV ANTIBODY (ROUTINE TESTING W REFLEX): HIV Screen 4th Generation wRfx: NONREACTIVE

## 2021-09-11 MED ORDER — CHLORHEXIDINE GLUCONATE CLOTH 2 % EX PADS
6.0000 | MEDICATED_PAD | Freq: Every day | CUTANEOUS | Status: DC
  Administered 2021-09-11 – 2021-09-16 (×5): 6 via TOPICAL

## 2021-09-11 MED ORDER — HYDROMORPHONE HCL 1 MG/ML IJ SOLN
1.0000 mg | INTRAMUSCULAR | Status: DC | PRN
  Administered 2021-09-11 – 2021-09-12 (×4): 1 mg via INTRAVENOUS
  Filled 2021-09-11 (×5): qty 1

## 2021-09-11 MED ORDER — SODIUM CHLORIDE 0.9% FLUSH: 10.0000 mL | INTRAVENOUS | Status: DC | PRN

## 2021-09-11 NOTE — Progress Notes (Signed)
Did not pull pt's NG tube bc she complained of nausea. Zofran given but pt states she is still nauseated. Will cont to monitor.

## 2021-09-11 NOTE — Progress Notes (Signed)
PROGRESS NOTE    Caitlyn Pacheco  YIR:[id number] DOB: 08-Nov-1956 DOA: 09/10/2021 PCP: Harvie Junior, MD    Brief Narrative:  Caitlyn Pacheco was admitted to the hospital with the working diagnosis of small bowel obstruction.   65 year old female past medical history for anxiety, chronic diastolic heart failure, COPD, GERD, hepatitis C, dyslipidemia and hypertension, she also has history of hemorrhagic CVA, osteoarthritis and osteoporosis.  Multiple prior abdominal surgeries and small bowel obstructions.  She reported 4 weeks of progressively worsening constant abdominal pain, associate with mild distention, nausea and occasional diarrhea.  Decreased appetite, fatigue and weight loss.  Patient had acute worsening abdominal pain that prompted her to come to the hospital.  On her initial physical examination temperature 98.7, heart rate 67, respiratory rate 18, blood pressure 189/105, oxygen saturation 100% on room air.  She had dry mucous membranes, her lungs are clear to auscultation bilaterally, heart S1-S2, present, rhythmic, the abdomen was mildly distended, mildly tender to palpation diffusely, no rebound or guarding, no lower extremity edema.  Sodium 134, potassium 3.6, chloride 98, bicarb 27, glucose 108, BUN 11, creatinine 0.69, magnesium 1.7, white count 4.9, hemoglobin 13.6, hematocrit 41.5, platelets 258. SARS COVID-19 negative.  CT of the abdomen pelvis with small bowel obstruction pattern in the mid small bowel.  Transition point along the ventral peritoneal wall left of midline.  Suspect adhesions related to prior surgery.  No pneumatosis or portal venous gas.  No perforation.   EKG 67 bpm, normal axis, normal intervals, sinus rhythm, concave ST elevation in lead II, lead III, aVF, no significant T wave changes.  Patient received supportive medical therapy including intravenous fluids, analgesics and antiemetics. Nasogastric was tube for decompression.  Assessment & Plan:    Principal Problem:   SBO (small bowel obstruction) ([facility]) Active Problems:   HTN (hypertension)   Chronic pain syndrome   Hepatitis C   COPD (chronic obstructive pulmonary disease) ([facility])   GERD (gastroesophageal reflux disease)   Hyponatremia   Small bowel obstruction. Patient continue to have abdominal pain, no nausea or vomiting. Continue to have NG tube in place.  Patient had 2 large bowel movements and abdominal film today shows contrast in the colon.  Plan to advance diet to clears and if good toleration to remove NG tube per recommendations from surgery  Continue pain control with hydromorphone (increase frequency) and as needed antiemetics. Continue antiacid therapy. Out of bed to chair and ambulate.   2, Hyponatremia, hypovolemic.  Check renal function and electrolytes today, patient has been NPO, continue IV fluids at a lower rate to 50 ml per hr.  Follow up renal function in am as well, including Mg.   3. HTN. Continue blood pressure monitoring, holding on antihypertensive medications.   4. COPD. No signs of acute exacerbation   Patient continue to be at high risk for worsening bowel obstruction   Status is: Inpatient  Remains inpatient appropriate because: NG tube and IV fluids   DVT prophylaxis: Enoxaparin   Code Status:    full  Family Communication:   No family at the bedside      Consultants:  Surgery     Subjective: Patient is feeling better, had 2 large bowel movements, no nausea or vomiting, continue to have NG tube in place, her abdomen continue to be tender   Objective: Vitals:   09/10/21 2154 09/11/21 0148 09/11/21 0608 09/11/21 0932  BP: (!) 146/68 137/75 124/68 (!) 151/75  Pulse: 62 (!) 59 (!) 58 60  Resp: 16 16 16 18   Temp: 98.2 F (36.8 C) (!) 97.4 F (36.3 C) 98.7 F (37.1 C) 98.2 F (36.8 C)  TempSrc: Oral Oral Oral Oral  SpO2: (!) 89% 91% 100% 99%  Weight:      Height:        Intake/Output Summary (Last 24 hours) at  09/11/2021 1310 Last data filed at 09/11/2021 1242 Gross per 24 hour  Intake 1903.77 ml  Output 425 ml  Net 1478.77 ml   Filed Weights   09/10/21 1022  Weight: 88.1 kg    Examination:   General: Not in pain or dyspnea, deconditioned Neurology: Awake and alert, non focal  E ENT: no pallor, no icterus, oral mucosa moist. NG tube in place.  Cardiovascular: No JVD. S1-S2 present, rhythmic, no gallops, rubs, or murmurs. No lower extremity edema. Pulmonary: positive breath sounds bilaterally, adequate air movement, no wheezing, rhonchi or rales. Gastrointestinal. Abdomen mild distended and tender to palpation Skin. No rashes Musculoskeletal: no joint deformities     Data Reviewed: I have personally reviewed following labs and imaging studies  CBC: Recent Labs  Lab 09/10/21 1034  WBC 4.9  NEUTROABS 2.7  HGB 13.6  HCT 41.5  MCV 72.6*  PLT 967   Basic Metabolic Panel: Recent Labs  Lab 09/10/21 1034  NA 134*  K 3.6  CL 98  CO2 27  GLUCOSE 108*  BUN 11  CREATININE 0.69  CALCIUM 8.8*  MG 1.7   GFR: Estimated Creatinine Clearance: 80.4 mL/min (by C-G formula based on SCr of 0.69 mg/dL). Liver Function Tests: Recent Labs  Lab 09/10/21 1034  AST 20  ALT 13  ALKPHOS 84  BILITOT 0.5  PROT 6.9  ALBUMIN 3.8   Recent Labs  Lab 09/10/21 1034  LIPASE 21   No results for input(s): AMMONIA in the last 168 hours. Coagulation Profile: No results for input(s): INR, PROTIME in the last 168 hours. Cardiac Enzymes: No results for input(s): CKTOTAL, CKMB, CKMBINDEX, TROPONINI in the last 168 hours. BNP (last 3 results) No results for input(s): PROBNP in the last 8760 hours. HbA1C: No results for input(s): HGBA1C in the last 72 hours. CBG: No results for input(s): GLUCAP in the last 168 hours. Lipid Profile: No results for input(s): CHOL, HDL, LDLCALC, TRIG, CHOLHDL, LDLDIRECT in the last 72 hours. Thyroid Function Tests: No results for input(s): TSH, T4TOTAL,  FREET4, T3FREE, THYROIDAB in the last 72 hours. Anemia Panel: No results for input(s): VITAMINB12, FOLATE, FERRITIN, TIBC, IRON, RETICCTPCT in the last 72 hours.    Radiology Studies: I have reviewed all of the imaging during this hospital visit personally     Scheduled Meds:  Chlorhexidine Gluconate Cloth  6 each Topical Daily   enoxaparin (LOVENOX) injection  40 mg Subcutaneous Q24H    morphine injection  4 mg Intravenous Once   pantoprazole (PROTONIX) IV  40 mg Intravenous Q24H   Continuous Infusions:  0.9 % NaCl with KCl 20 mEq / L 100 mL/hr at 09/11/21 0756     LOS: 1 day        Stefanie Hodgens Gerome Apley, MD

## 2021-09-11 NOTE — Progress Notes (Signed)
Subjective/Chief Complaint: PT doing well this AM Had BM overnight AM KUB reviewed   Objective: Vital signs in last 24 hours: Temp:  [97.3 F (36.3 C)-98.7 F (37.1 C)] 98.7 F (37.1 C) (10/22 0608) Pulse Rate:  [58-92] 58 (10/22 0608) Resp:  [12-23] 16 (10/22 7939) BP: (124-189)/(68-111) 124/68 (10/22 0300) SpO2:  [89 %-100 %] 100 % (10/22 9233) Weight:  [88.1 kg] 88.1 kg (10/21 1022) Last BM Date: 09/11/21  Intake/Output from previous day: 10/21 0701 - 10/22 0700 In: 1873.8 [P.O.:120; I.V.:1753.8] Out: 425 [Emesis/NG output:225] Intake/Output this shift: Total I/O In: 30 [NG/GT:30] Out: -   PE:  Constitutional: No acute distress, conversant, appears states age. Eyes: Anicteric sclerae, moist conjunctiva, no lid lag Lungs: Clear to auscultation bilaterally, normal respiratory effort CV: regular rate and rhythm, no murmurs, no peripheral edema, pedal pulses 2+ GI: Soft, no masses or hepatosplenomegaly, non-tender to palpation Skin: No rashes, palpation reveals normal turgor Psychiatric: appropriate judgment and insight, oriented to person, place, and time   Lab Results:  Recent Labs    09/10/21 1034  WBC 4.9  HGB 13.6  HCT 41.5  PLT 258   BMET Recent Labs    09/10/21 1034  NA 134*  K 3.6  CL 98  CO2 27  GLUCOSE 108*  BUN 11  CREATININE 0.69  CALCIUM 8.8*   PT/INR No results for input(s): LABPROT, INR in the last 72 hours. ABG No results for input(s): PHART, HCO3 in the last 72 hours.  Invalid input(s): PCO2, PO2  Studies/Results: DG Abdomen 1 View  Result Date: 09/10/2021 CLINICAL DATA:  Post NGT insertion. EXAM: ABDOMEN - 1 VIEW COMPARISON:  CT abdomen/pelvis 09/10/2021. FINDINGS: A problem-oriented radiograph of the upper abdomen was performed to assess enteric tube placement. An enteric tube passes below the level of the left hemidiaphragm with tip projecting in the expected location of the gastric antrum/pylorus. Findings of  small-bowel obstruction were better appreciated on the CT abdomen/pelvis performed earlier today. The imaged lung bases are clear. IMPRESSION: An enteric tube passes below level of left hemidiaphragm with tip projecting in the expected location of the gastric antrum/pylorus. Electronically Signed   By: Kellie Simmering D.O.   On: 09/10/2021 12:45   CT ABDOMEN PELVIS W CONTRAST  Result Date: 09/10/2021 CLINICAL DATA:  Chronic abdominal pain.  Worsening pain. EXAM: CT ABDOMEN AND PELVIS WITH CONTRAST TECHNIQUE: Multidetector CT imaging of the abdomen and pelvis was performed using the standard protocol following bolus administration of intravenous contrast. CONTRAST:  168mL OMNIPAQUE IOHEXOL 300 MG/ML  SOLN COMPARISON:  CT 04/06/2017 FINDINGS: Lower chest: Lung bases are clear. Hepatobiliary: No focal hepatic lesion. No biliary duct dilatation. Common bile duct is mildly dilated following cholecystectomy. Pancreas: Pancreas is normal. No ductal dilatation. No pancreatic inflammation. Spleen: Normal spleen Adrenals/urinary tract: Adrenal glands normal. Kidneys, ureters and bladder normal. Stomach/Bowel: Oral contrast within the stomach, duodenum and proximal small bowel. No contrast in the distal small bowel. There is evidence of stasis of enteric contents in the mid small bowel. Small bowel is dilated to 3.4 cm (image 43/2). A potential transition point identified on coronal image 80/series 5 along the ventral peritoneal surface just LEFT of midline (image 47/2). No pneumatosis or portal venous gas.  No bowel perforation. Terminal ileum is normal.  Colon is relatively decompressed. There is a small bowel small bowel anastomosis in the RIGHT lower quadrant (image 60/2) indicating prior abdominal surgery. Vascular/Lymphatic: Abdominal aorta is normal caliber with atherosclerotic calcification. There is no  retroperitoneal or periportal lymphadenopathy. No pelvic lymphadenopathy. Reproductive: Post hysterectomy.  Adnexa  unremarkable Other: No free fluid. Musculoskeletal: No aggressive osseous lesion. IMPRESSION: 1. Small bowel obstruction pattern in the mid small bowel. Transition point along the ventral peritoneal wall LEFT of midline. Suspect adhesions related to prior surgery. 2. No pneumatosis or portal venous gas.  No perforation. Findings conveyed to Drema Dallas 09/10/2021  at11:32. Electronically Signed   By: Suzy Bouchard M.D.   On: 09/10/2021 11:33   DG Abd Portable 1V-Small Bowel Obstruction Protocol-initial, 8 hr delay  Result Date: 09/11/2021 CLINICAL DATA:  A 65 year old female presents with small bowel obstruction postcontrast administration on the floor for assessment. Is EXAM: PORTABLE ABDOMEN - 1 VIEW COMPARISON:  Comparison made with CT examination of September 10, 2021. FINDINGS: Nasogastric tube terminates in the stomach. Side port below the expected location of the GE junction. Lung bases with basilar atelectasis. Mildly dilated to moderately dilated small bowel loops in the LEFT and lower abdomen. Passage of contrast into the colon and passing essentially throughout the entire colon on the current study. On limited assessment there is no acute skeletal process. IMPRESSION: Nasogastric tube terminates in the stomach. Mildly dilated to moderately dilated small bowel loops in the LEFT and lower abdomen, but with passage of contrast into the colon. Basilar atelectasis. Electronically Signed   By: Zetta Bills M.D.   On: 09/11/2021 08:03     Assessment/Plan: SBO  -contrast in colon on KUB.   -clamp NGT and trial full liquids -CAn DC NGT if tol clamping x 6h - Keep K >4 and Mg > 2 for bowel function - Mobilize for bowel function - Hold Plavix incase patient does not improve and requires exploration during admission.    ID - None currently indicated. Afebrile. WBC 4.9 VTE - SCDs, okay for chemical prophyalxis from a general surgery standpoint. Hold Plavix FEN - FLD, NGT, IVF per TRH Foley -  none   CHF COPD Chronic Pain Syndrome - takes oxycodone 15mg  q6 hours Hep C s/p Epclusa x 12 weeks HTN HLD TIA/CVA on Plavix (last dose 2 weeks PTA)  Thyroid disease Tobacco abuse   LOS: 1 day    Ralene Ok 09/11/2021

## 2021-09-12 DIAGNOSIS — J449 Chronic obstructive pulmonary disease, unspecified: Secondary | ICD-10-CM

## 2021-09-12 DIAGNOSIS — K219 Gastro-esophageal reflux disease without esophagitis: Secondary | ICD-10-CM

## 2021-09-12 DIAGNOSIS — K56609 Unspecified intestinal obstruction, unspecified as to partial versus complete obstruction: Secondary | ICD-10-CM | POA: Diagnosis not present

## 2021-09-12 DIAGNOSIS — B1920 Unspecified viral hepatitis C without hepatic coma: Secondary | ICD-10-CM | POA: Diagnosis not present

## 2021-09-12 LAB — BASIC METABOLIC PANEL
Anion gap: 7 (ref 5–15)
BUN: 7 mg/dL — ABNORMAL LOW (ref 8–23)
CO2: 25 mmol/L (ref 22–32)
Calcium: 8.4 mg/dL — ABNORMAL LOW (ref 8.9–10.3)
Chloride: 106 mmol/L (ref 98–111)
Creatinine, Ser: 0.84 mg/dL (ref 0.44–1.00)
GFR, Estimated: >60 mL/min (ref >60)
Glucose, Bld: 122 mg/dL — ABNORMAL HIGH (ref 70–99)
Potassium: 3.8 mmol/L (ref 3.5–5.1)
Sodium: 138 mmol/L (ref 135–145)

## 2021-09-12 MED ORDER — OXYCODONE HCL 5 MG PO TABS
15.0000 mg | ORAL_TABLET | Freq: Four times a day (QID) | ORAL | Status: DC | PRN
  Administered 2021-09-12: 15 mg via ORAL
  Filled 2021-09-12: qty 3

## 2021-09-12 MED ORDER — PROMETHAZINE HCL 25 MG RE SUPP
25.0000 mg | Freq: Four times a day (QID) | RECTAL | Status: DC | PRN
  Filled 2021-09-12: qty 1

## 2021-09-12 MED ORDER — CLOPIDOGREL BISULFATE 75 MG PO TABS
75.0000 mg | ORAL_TABLET | Freq: Every day | ORAL | Status: DC
  Administered 2021-09-12 – 2021-09-13 (×2): 75 mg via ORAL
  Filled 2021-09-12 (×2): qty 1

## 2021-09-12 MED ORDER — SOFOSBUVIR-VELPATASVIR 400-100 MG PO TABS: 1.0000 | ORAL_TABLET | Freq: Every day | ORAL | Status: DC

## 2021-09-12 MED ORDER — OLMESARTAN-AMLODIPINE-HCTZ 20-5-12.5 MG PO TABS: 1.0000 | ORAL_TABLET | Freq: Every day | ORAL | Status: DC

## 2021-09-12 MED ORDER — HYDROCHLOROTHIAZIDE 12.5 MG PO TABS
12.5000 mg | ORAL_TABLET | Freq: Every day | ORAL | Status: DC
  Administered 2021-09-12 – 2021-09-15 (×4): 12.5 mg via ORAL
  Filled 2021-09-12 (×5): qty 1

## 2021-09-12 MED ORDER — AMLODIPINE BESYLATE 5 MG PO TABS
5.0000 mg | ORAL_TABLET | Freq: Every day | ORAL | Status: DC
  Administered 2021-09-12 – 2021-09-13 (×2): 5 mg via ORAL
  Filled 2021-09-12 (×5): qty 1

## 2021-09-12 MED ORDER — PANTOPRAZOLE SODIUM 40 MG PO TBEC
40.0000 mg | DELAYED_RELEASE_TABLET | Freq: Every day | ORAL | Status: DC
  Administered 2021-09-12 – 2021-09-16 (×5): 40 mg via ORAL
  Filled 2021-09-12 (×5): qty 1

## 2021-09-12 MED ORDER — ASPIRIN 81 MG PO CHEW
81.0000 mg | CHEWABLE_TABLET | Freq: Every day | ORAL | Status: DC
  Administered 2021-09-12 – 2021-09-16 (×5): 81 mg via ORAL
  Filled 2021-09-12 (×5): qty 1

## 2021-09-12 MED ORDER — FLUTICASONE PROPIONATE 50 MCG/ACT NA SUSP
2.0000 | Freq: Every day | NASAL | Status: DC | PRN
  Filled 2021-09-12: qty 16

## 2021-09-12 MED ORDER — DIAZEPAM 5 MG PO TABS
5.0000 mg | ORAL_TABLET | Freq: Three times a day (TID) | ORAL | Status: DC | PRN
  Administered 2021-09-12: 5 mg via ORAL
  Filled 2021-09-12 (×2): qty 1

## 2021-09-12 MED ORDER — SUCRALFATE 1 GM/10ML PO SUSP
1.0000 g | Freq: Three times a day (TID) | ORAL | Status: DC
  Administered 2021-09-12 – 2021-09-16 (×15): 1 g via ORAL
  Filled 2021-09-12 (×15): qty 10

## 2021-09-12 MED ORDER — MOMETASONE FURO-FORMOTEROL FUM 200-5 MCG/ACT IN AERO
2.0000 | INHALATION_SPRAY | Freq: Two times a day (BID) | RESPIRATORY_TRACT | Status: DC
  Administered 2021-09-12 – 2021-09-16 (×8): 2 via RESPIRATORY_TRACT
  Filled 2021-09-12: qty 8.8

## 2021-09-12 MED ORDER — PROMETHAZINE HCL 25 MG PO TABS: 25.0000 mg | ORAL_TABLET | Freq: Four times a day (QID) | ORAL | Status: DC | PRN

## 2021-09-12 MED ORDER — IRBESARTAN 150 MG PO TABS
150.0000 mg | ORAL_TABLET | Freq: Every day | ORAL | Status: DC
  Administered 2021-09-12 – 2021-09-16 (×5): 150 mg via ORAL
  Filled 2021-09-12 (×5): qty 1

## 2021-09-12 MED ORDER — LUBIPROSTONE 24 MCG PO CAPS
24.0000 ug | ORAL_CAPSULE | Freq: Two times a day (BID) | ORAL | Status: DC
  Administered 2021-09-12 – 2021-09-15 (×7): 24 ug via ORAL
  Filled 2021-09-12 (×8): qty 1

## 2021-09-12 MED ORDER — ALBUTEROL SULFATE (2.5 MG/3ML) 0.083% IN NEBU: 3.0000 mL | INHALATION_SOLUTION | Freq: Three times a day (TID) | RESPIRATORY_TRACT | Status: DC | PRN

## 2021-09-12 MED ORDER — FENOFIBRATE 160 MG PO TABS
160.0000 mg | ORAL_TABLET | Freq: Every day | ORAL | Status: DC
  Administered 2021-09-12 – 2021-09-16 (×5): 160 mg via ORAL
  Filled 2021-09-12 (×5): qty 1

## 2021-09-12 MED ORDER — OXYCODONE HCL 5 MG PO TABS
15.0000 mg | ORAL_TABLET | Freq: Four times a day (QID) | ORAL | Status: DC | PRN
  Administered 2021-09-12 – 2021-09-16 (×9): 15 mg via ORAL
  Filled 2021-09-12 (×9): qty 3

## 2021-09-12 MED ORDER — PREGABALIN 100 MG PO CAPS
200.0000 mg | ORAL_CAPSULE | Freq: Three times a day (TID) | ORAL | Status: DC
  Administered 2021-09-12 – 2021-09-16 (×11): 200 mg via ORAL
  Filled 2021-09-12 (×12): qty 2

## 2021-09-12 MED ORDER — ALTEPLASE 2 MG IJ SOLR
2.0000 mg | Freq: Once | INTRAMUSCULAR | Status: AC
  Administered 2021-09-12: 2 mg
  Filled 2021-09-12: qty 2

## 2021-09-12 MED ORDER — HYDROMORPHONE HCL 1 MG/ML IJ SOLN
1.0000 mg | INTRAMUSCULAR | Status: DC | PRN
  Administered 2021-09-13 – 2021-09-14 (×4): 1 mg via INTRAVENOUS
  Filled 2021-09-12 (×4): qty 1

## 2021-09-12 MED ORDER — SUMATRIPTAN SUCCINATE 50 MG PO TABS
100.0000 mg | ORAL_TABLET | ORAL | Status: DC | PRN
  Filled 2021-09-12: qty 2

## 2021-09-12 MED ORDER — DICYCLOMINE HCL 10 MG PO CAPS
10.0000 mg | ORAL_CAPSULE | Freq: Two times a day (BID) | ORAL | Status: DC
  Administered 2021-09-12 – 2021-09-16 (×9): 10 mg via ORAL
  Filled 2021-09-12 (×8): qty 1

## 2021-09-12 MED ORDER — ALUM & MAG HYDROXIDE-SIMETH 200-200-20 MG/5ML PO SUSP
30.0000 mL | Freq: Once | ORAL | Status: AC
  Administered 2021-09-12: 30 mL via ORAL
  Filled 2021-09-12: qty 30

## 2021-09-12 NOTE — Progress Notes (Addendum)
PROGRESS NOTE    Caitlyn Pacheco  ZOX:[id number] DOB: 1956-10-05 DOA: 09/10/2021 PCP: Harvie Junior, MD    Brief Narrative:  Mrs. Gitto was admitted to the hospital with the working diagnosis of small bowel obstruction.    65 year old female past medical history for anxiety, chronic diastolic heart failure, COPD, GERD, hepatitis C, dyslipidemia and hypertension, she also has history of hemorrhagic CVA, osteoarthritis and osteoporosis.  Multiple prior abdominal surgeries and small bowel obstructions.  She reported 4 weeks of progressively worsening constant abdominal pain, associate with mild distention, nausea and occasional diarrhea.  Decreased appetite, fatigue and weight loss.  Patient had acute worsening abdominal pain that prompted her to come to the hospital.  On her initial physical examination temperature 98.7, heart rate 67, respiratory rate 18, blood pressure 189/105, oxygen saturation 100% on room air.  She had dry mucous membranes, her lungs are clear to auscultation bilaterally, heart S1-S2, present, rhythmic, the abdomen was mildly distended, mildly tender to palpation diffusely, no rebound or guarding, no lower extremity edema.   Sodium 134, potassium 3.6, chloride 98, bicarb 27, glucose 108, BUN 11, creatinine 0.69, magnesium 1.7, white count 4.9, hemoglobin 13.6, hematocrit 41.5, platelets 258. SARS COVID-19 negative.   CT of the abdomen pelvis with small bowel obstruction pattern in the mid small bowel.  Transition point along the ventral peritoneal wall left of midline.  Suspect adhesions related to prior surgery.  No pneumatosis or portal venous gas.  No perforation.     EKG 67 bpm, normal axis, normal intervals, sinus rhythm, concave ST elevation in lead II, lead III, aVF, no significant T wave changes.   Patient received supportive medical therapy including intravenous fluids, analgesics and antiemetics. Nasogastric was tube for decompression.   Assessment &  Plan:   Principal Problem:   SBO (small bowel obstruction) (HCC) Active Problems:   HTN (hypertension)   Chronic pain syndrome   Hepatitis C   COPD (chronic obstructive pulmonary disease) (HCC)   GERD (gastroesophageal reflux disease)   Hyponatremia   Small bowel obstruction.  Patient with positive bowel movement, NG tube has been removed, she has no nausea or vomiting, but significant epigastric abdominal pain improved with GI cocktail.    Continue supportive medical therapy, with as needed morphine for pain control and zofran for nausea control. Continue antiacid therapy with pantoprazole bid and will add sucralfate.  Out of bed as tolerated.    2, Hyponatremia,  Renal function stable with serum cr at 0,84, K is 3,8 and serum bicarbonate is 25. Will discontinue IV fluids, now patient is allowed to eat.    3. HTN/ dyslipidemia.  Uncontrolled hypertension with systolic 811 mmHg, will continue with as needed hydralazine. Resume asa and clopidogrel, EKG personally reviewed with no ischemic changes, HR 61 bpm, normal axis and normal intervals, sinus rhythm, no significant ST segment or T wave changes.   Resume blood pressure regimen with Olmesartan, amlodipine and HCTZ.  Continue with fenofibrate.   4. COPD. No clinical signs of acute exacerbation  Continue with bronchodilator therapy and inhales steroids.    5. Anxiety/ chronic pain syndrome. Resume as needed diazepam.  Continue with oxycodone, pregabalin,  Patient continue to be at high risk for worsening abdominal pain   6. Hepatitis C. Resumed antiviral therapy.   Status is: Inpatient  Remains inpatient appropriate because: IV analgesics     DVT prophylaxis: Enoxaparin   Code Status:    full  Family Communication:   Daughter at her bedside  Consultants:  Surgery     Subjective: Patient continue to have abdominal pain, epigastric and radiated to the chest, improved with antiacids, no nausea or vomiting  and positive bowel movement.   Objective: Vitals:   09/11/21 2128 09/12/21 0214 09/12/21 0548 09/12/21 0829  BP: (!) 159/87 (!) 146/70 (!) 156/80 (!) 194/99  Pulse: 60 (!) 58 60 68  Resp: 16 16 16 18   Temp: 98.7 F (37.1 C) 98 F (36.7 C) (!) 97.4 F (36.3 C) 99 F (37.2 C)  TempSrc: Oral Oral Oral Oral  SpO2: 97% 99% 98% 94%  Weight:      Height:        Intake/Output Summary (Last 24 hours) at 09/12/2021 1216 Last data filed at 09/12/2021 1000 Gross per 24 hour  Intake 1935.66 ml  Output 500 ml  Net 1435.66 ml   Filed Weights   09/10/21 1022  Weight: 88.1 kg    Examination:   General: deconditioned and in pain,.  Neurology: Awake and alert, non focal  E ENT: no pallor, no icterus, oral mucosa moist Cardiovascular: No JVD. S1-S2 present, rhythmic, no gallops, rubs, or murmurs. No lower extremity edema. Pulmonary: positive breath sounds bilaterally, adequate air movement, no wheezing, rhonchi or rales. Gastrointestinal. Abdomen soft, tender to palpation at the epigastrium, no guarding or rebound.  Skin. No rashes Musculoskeletal: no joint deformities     Data Reviewed: I have personally reviewed following labs and imaging studies  CBC: Recent Labs  Lab 09/10/21 1034  WBC 4.9  NEUTROABS 2.7  HGB 13.6  HCT 41.5  MCV 72.6*  PLT 096   Basic Metabolic Panel: Recent Labs  Lab 09/10/21 1034 09/11/21 1705 09/12/21 0301  NA 134* 136 138  K 3.6 3.8 3.8  CL 98 107 106  CO2 27 25 25   GLUCOSE 108* 116* 122*  BUN 11 9 7*  CREATININE 0.69 0.74 0.84  CALCIUM 8.8* 7.9* 8.4*  MG 1.7  --   --    GFR: Estimated Creatinine Clearance: 76.6 mL/min (by C-G formula based on SCr of 0.84 mg/dL). Liver Function Tests: Recent Labs  Lab 09/10/21 1034  AST 20  ALT 13  ALKPHOS 84  BILITOT 0.5  PROT 6.9  ALBUMIN 3.8   Recent Labs  Lab 09/10/21 1034  LIPASE 21   No results for input(s): AMMONIA in the last 168 hours. Coagulation Profile: No results for  input(s): INR, PROTIME in the last 168 hours. Cardiac Enzymes: No results for input(s): CKTOTAL, CKMB, CKMBINDEX, TROPONINI in the last 168 hours. BNP (last 3 results) No results for input(s): PROBNP in the last 8760 hours. HbA1C: No results for input(s): HGBA1C in the last 72 hours. CBG: No results for input(s): GLUCAP in the last 168 hours. Lipid Profile: No results for input(s): CHOL, HDL, LDLCALC, TRIG, CHOLHDL, LDLDIRECT in the last 72 hours. Thyroid Function Tests: Recent Labs    09/11/21 1705  TSH 1.412   Anemia Panel: No results for input(s): VITAMINB12, FOLATE, FERRITIN, TIBC, IRON, RETICCTPCT in the last 72 hours.    Radiology Studies: I have reviewed all of the imaging during this hospital visit personally     Scheduled Meds:  Chlorhexidine Gluconate Cloth  6 each Topical Daily    morphine injection  4 mg Intravenous Once   pantoprazole (PROTONIX) IV  40 mg Intravenous Q24H   Continuous Infusions:  0.9 % NaCl with KCl 20 mEq / L 50 mL/hr at 09/12/21 0600     LOS: 2 days  Arieanna Pressey Gerome Apley, MD

## 2021-09-12 NOTE — Progress Notes (Signed)
Subjective/Chief Complaint: Having bowel movements, contrast in colon on xray, baseline nausea, tol diet, I reconnected ng this am with no residual, having chest pain   Objective: Vital signs in last 24 hours: Temp:  [97.4 F (36.3 C)-99 F (37.2 C)] 99 F (37.2 C) (10/23 0829) Pulse Rate:  [58-69] 68 (10/23 0829) Resp:  [16-18] 18 (10/23 0829) BP: (145-194)/(70-99) 194/99 (10/23 0829) SpO2:  [93 %-99 %] 94 % (10/23 0829) Last BM Date: 09/11/21  Intake/Output from previous day: 10/22 0701 - 10/23 0700 In: 2272.2 [P.O.:600; I.V.:1612.2; NG/GT:60] Out: 200 [Urine:200] Intake/Output this shift: No intake/output data recorded.  GI: soft nontender nondistended  Lab Results:  Recent Labs    09/10/21 1034  WBC 4.9  HGB 13.6  HCT 41.5  PLT 258   BMET Recent Labs    09/11/21 1705 09/12/21 0301  NA 136 138  K 3.8 3.8  CL 107 106  CO2 25 25  GLUCOSE 116* 122*  BUN 9 7*  CREATININE 0.74 0.84  CALCIUM 7.9* 8.4*   PT/INR No results for input(s): LABPROT, INR in the last 72 hours. ABG No results for input(s): PHART, HCO3 in the last 72 hours.  Invalid input(s): PCO2, PO2  Studies/Results: DG Abdomen 1 View  Result Date: 09/10/2021 CLINICAL DATA:  Post NGT insertion. EXAM: ABDOMEN - 1 VIEW COMPARISON:  CT abdomen/pelvis 09/10/2021. FINDINGS: A problem-oriented radiograph of the upper abdomen was performed to assess enteric tube placement. An enteric tube passes below the level of the left hemidiaphragm with tip projecting in the expected location of the gastric antrum/pylorus. Findings of small-bowel obstruction were better appreciated on the CT abdomen/pelvis performed earlier today. The imaged lung bases are clear. IMPRESSION: An enteric tube passes below level of left hemidiaphragm with tip projecting in the expected location of the gastric antrum/pylorus. Electronically Signed   By: Kellie Simmering D.O.   On: 09/10/2021 12:45   CT ABDOMEN PELVIS W  CONTRAST  Result Date: 09/10/2021 CLINICAL DATA:  Chronic abdominal pain.  Worsening pain. EXAM: CT ABDOMEN AND PELVIS WITH CONTRAST TECHNIQUE: Multidetector CT imaging of the abdomen and pelvis was performed using the standard protocol following bolus administration of intravenous contrast. CONTRAST:  157mL OMNIPAQUE IOHEXOL 300 MG/ML  SOLN COMPARISON:  CT 04/06/2017 FINDINGS: Lower chest: Lung bases are clear. Hepatobiliary: No focal hepatic lesion. No biliary duct dilatation. Common bile duct is mildly dilated following cholecystectomy. Pancreas: Pancreas is normal. No ductal dilatation. No pancreatic inflammation. Spleen: Normal spleen Adrenals/urinary tract: Adrenal glands normal. Kidneys, ureters and bladder normal. Stomach/Bowel: Oral contrast within the stomach, duodenum and proximal small bowel. No contrast in the distal small bowel. There is evidence of stasis of enteric contents in the mid small bowel. Small bowel is dilated to 3.4 cm (image 43/2). A potential transition point identified on coronal image 80/series 5 along the ventral peritoneal surface just LEFT of midline (image 47/2). No pneumatosis or portal venous gas.  No bowel perforation. Terminal ileum is normal.  Colon is relatively decompressed. There is a small bowel small bowel anastomosis in the RIGHT lower quadrant (image 60/2) indicating prior abdominal surgery. Vascular/Lymphatic: Abdominal aorta is normal caliber with atherosclerotic calcification. There is no retroperitoneal or periportal lymphadenopathy. No pelvic lymphadenopathy. Reproductive: Post hysterectomy.  Adnexa unremarkable Other: No free fluid. Musculoskeletal: No aggressive osseous lesion. IMPRESSION: 1. Small bowel obstruction pattern in the mid small bowel. Transition point along the ventral peritoneal wall LEFT of midline. Suspect adhesions related to prior surgery. 2. No pneumatosis or  portal venous gas.  No perforation. Findings conveyed to Drema Dallas 09/10/2021   at11:32. Electronically Signed   By: Suzy Bouchard M.D.   On: 09/10/2021 11:33   DG Abd Portable 1V-Small Bowel Obstruction Protocol-initial, 8 hr delay  Result Date: 09/11/2021 CLINICAL DATA:  A 65 year old female presents with small bowel obstruction postcontrast administration on the floor for assessment. Is EXAM: PORTABLE ABDOMEN - 1 VIEW COMPARISON:  Comparison made with CT examination of September 10, 2021. FINDINGS: Nasogastric tube terminates in the stomach. Side port below the expected location of the GE junction. Lung bases with basilar atelectasis. Mildly dilated to moderately dilated small bowel loops in the LEFT and lower abdomen. Passage of contrast into the colon and passing essentially throughout the entire colon on the current study. On limited assessment there is no acute skeletal process. IMPRESSION: Nasogastric tube terminates in the stomach. Mildly dilated to moderately dilated small bowel loops in the LEFT and lower abdomen, but with passage of contrast into the colon. Basilar atelectasis. Electronically Signed   By: Zetta Bills M.D.   On: 09/11/2021 08:03    Anti-infectives: Anti-infectives (From admission, onward)    None       Assessment/Plan: SBO  -contrast in colon on KUB.   -dc ng this am and start soft diet - Keep K >4 and Mg > 2 for bowel function - Mobilize for bowel function - with chest pain can restart plavix as it appears bowel obstruction resolved    VTE - SCDs, okay for chemical prophyalxis from a general surgery standpoint. May restart plavix FEN - soft diet Foley - none   CHF-workup for chest pain, anti htn and check ekg, TRH is aware COPD Chronic Pain Syndrome - takes oxycodone 15mg  q6 hours, will restart home med Hep C s/p Epclusa x 12 weeks HTN HLD TIA/CVA on Plavix (last dose 2 weeks PTA)  Thyroid disease Tobacco abuse  Rolm Bookbinder 09/12/2021

## 2021-09-13 DIAGNOSIS — J441 Chronic obstructive pulmonary disease with (acute) exacerbation: Secondary | ICD-10-CM | POA: Diagnosis not present

## 2021-09-13 DIAGNOSIS — G894 Chronic pain syndrome: Secondary | ICD-10-CM | POA: Diagnosis not present

## 2021-09-13 DIAGNOSIS — K56609 Unspecified intestinal obstruction, unspecified as to partial versus complete obstruction: Secondary | ICD-10-CM | POA: Diagnosis not present

## 2021-09-13 DIAGNOSIS — I10 Essential (primary) hypertension: Secondary | ICD-10-CM

## 2021-09-13 DIAGNOSIS — B1920 Unspecified viral hepatitis C without hepatic coma: Secondary | ICD-10-CM | POA: Diagnosis not present

## 2021-09-13 LAB — BASIC METABOLIC PANEL
Anion gap: 4 — ABNORMAL LOW (ref 5–15)
BUN: 8 mg/dL (ref 8–23)
CO2: 28 mmol/L (ref 22–32)
Calcium: 8.4 mg/dL — ABNORMAL LOW (ref 8.9–10.3)
Chloride: 103 mmol/L (ref 98–111)
Creatinine, Ser: 0.88 mg/dL (ref 0.44–1.00)
GFR, Estimated: >60 mL/min (ref >60)
Glucose, Bld: 112 mg/dL — ABNORMAL HIGH (ref 70–99)
Potassium: 3.9 mmol/L (ref 3.5–5.1)
Sodium: 135 mmol/L (ref 135–145)

## 2021-09-13 MED ORDER — POLYETHYLENE GLYCOL 3350 17 G PO PACK
17.0000 g | PACK | Freq: Every day | ORAL | Status: DC
  Administered 2021-09-13 – 2021-09-14 (×2): 17 g via ORAL
  Filled 2021-09-13 (×2): qty 1

## 2021-09-13 MED ORDER — SODIUM CHLORIDE 0.9 % IV SOLN: INTRAVENOUS | Status: DC | PRN

## 2021-09-13 NOTE — Progress Notes (Signed)
PROGRESS NOTE    Caitlyn Pacheco  QBH:[id number] DOB: 1956-09-28 DOA: 09/10/2021 PCP: Harvie Junior, MD    Brief Narrative:  Mrs. Blankenbaker was admitted to the hospital with the working diagnosis of small bowel obstruction.    65 year old female past medical history for anxiety, chronic diastolic heart failure, COPD, GERD, hepatitis C, dyslipidemia and hypertension, she also has history of hemorrhagic CVA, osteoarthritis and osteoporosis.  Multiple prior abdominal surgeries and small bowel obstructions.  She reported 4 weeks of progressively worsening constant abdominal pain, associate with mild distention, nausea and occasional diarrhea.  Decreased appetite, fatigue and weight loss.  Patient had acute worsening abdominal pain that prompted her to come to the hospital.  On her initial physical examination temperature 98.7, heart rate 67, respiratory rate 18, blood pressure 189/105, oxygen saturation 100% on room air.  She had dry mucous membranes, her lungs are clear to auscultation bilaterally, heart S1-S2, present, rhythmic, the abdomen was mildly distended, mildly tender to palpation diffusely, no rebound or guarding, no lower extremity edema.   Sodium 134, potassium 3.6, chloride 98, bicarb 27, glucose 108, BUN 11, creatinine 0.69, magnesium 1.7, white count 4.9, hemoglobin 13.6, hematocrit 41.5, platelets 258. SARS COVID-19 negative.   CT of the abdomen pelvis with small bowel obstruction pattern in the mid small bowel.  Transition point along the ventral peritoneal wall left of midline.  Suspect adhesions related to prior surgery.  No pneumatosis or portal venous gas.  No perforation.     EKG 67 bpm, normal axis, normal intervals, sinus rhythm, concave ST elevation in lead II, lead III, aVF, no significant T wave changes.   Patient received supportive medical therapy including intravenous fluids, analgesics and antiemetics. Nasogastric was tube for decompression with good  toleration.  Her symptoms improved, positive bowel movements with no nausea or vomiting. NG tube removed 09/12/21.    Assessment & Plan:   Principal Problem:   SBO (small bowel obstruction) (HCC) Active Problems:   HTN (hypertension)   Chronic pain syndrome   Hepatitis C   COPD (chronic obstructive pulmonary disease) (HCC)   GERD (gastroesophageal reflux disease)   Hyponatremia   Small bowel obstruction.  Patient with no nausea or vomiting, no bowel movement today. Continue to have abdominal pain.    Plan to continue with as needed analgesics and antiacids Antiacid therapy with pantoprazole and sucralfate. Add miralax for constipation, encourage to be out of bed to chair and ambulate in the hallway.  Cleat liquid diet per surgery recommendations.    2, Hyponatremia,  Today serum Na is 135, K 3,9 and serum bicarbonate at 28 with cr at 0,88. Patient is tolerating po well.    3. HTN/ dyslipidemia.  Improved blood pressure control now patient back on her antihypertensive regimen with: Olmesartan, amlodipine and HCTZ.  On fenofibrate.    4. COPD. No acute exacerbation  On bronchodilator therapy and inhales steroids.    5. Anxiety/ chronic pain syndrome.  Continue with PRBN diazepam. Recent loss in her family.  At home on oxycodone and pregabalin,    6. Hepatitis C. Patient completed therapy as outpatient     Status is: Inpatient  Remains inpatient appropriate because: clinical monitoring abdominal pain.   DVT prophylaxis: Enoxaparin   Code Status:    full  Family Communication:   No family at the bedside     Consultants:  Surgery     Subjective: Patient with persistent abdominal pain, feeling very weak and deconditioned, no bowel movement today,  no nausea or vomiting,   Objective: Vitals:   09/13/21 0629 09/13/21 0845 09/13/21 0846 09/13/21 0914  BP: 109/67   (!) 118/52  Pulse: 62   63  Resp: 18     Temp: 99.4 F (37.4 C)     TempSrc: Oral     SpO2:  97% 97% 97%   Weight:      Height:        Intake/Output Summary (Last 24 hours) at 09/13/2021 1246 Last data filed at 09/13/2021 1000 Gross per 24 hour  Intake 1080 ml  Output 800 ml  Net 280 ml   Filed Weights   09/10/21 1022  Weight: 88.1 kg    Examination:   General: Not in pain or dyspnea Neurology: Awake and alert, non focal  E ENT: no pallor, no icterus, oral mucosa moist Cardiovascular: No JVD. S1-S2 present, rhythmic, no gallops, rubs, or murmurs. No lower extremity edema. Pulmonary: vesicular breath sounds bilaterally, adequate air movement, no wheezing, rhonchi or rales. Gastrointestinal. Abdomen soft but mild tender to superficial palpation, no guarding or rebound Skin. No rashes Musculoskeletal: no joint deformities     Data Reviewed: I have personally reviewed following labs and imaging studies  CBC: Recent Labs  Lab 09/10/21 1034  WBC 4.9  NEUTROABS 2.7  HGB 13.6  HCT 41.5  MCV 72.6*  PLT 814   Basic Metabolic Panel: Recent Labs  Lab 09/10/21 1034 09/11/21 1705 09/12/21 0301 09/13/21 0312  NA 134* 136 138 135  K 3.6 3.8 3.8 3.9  CL 98 107 106 103  CO2 27 25 25 28   GLUCOSE 108* 116* 122* 112*  BUN 11 9 7* 8  CREATININE 0.69 0.74 0.84 0.88  CALCIUM 8.8* 7.9* 8.4* 8.4*  MG 1.7  --   --   --    GFR: Estimated Creatinine Clearance: 73.1 mL/min (by C-G formula based on SCr of 0.88 mg/dL). Liver Function Tests: Recent Labs  Lab 09/10/21 1034  AST 20  ALT 13  ALKPHOS 84  BILITOT 0.5  PROT 6.9  ALBUMIN 3.8   Recent Labs  Lab 09/10/21 1034  LIPASE 21   No results for input(s): AMMONIA in the last 168 hours. Coagulation Profile: No results for input(s): INR, PROTIME in the last 168 hours. Cardiac Enzymes: No results for input(s): CKTOTAL, CKMB, CKMBINDEX, TROPONINI in the last 168 hours. BNP (last 3 results) No results for input(s): PROBNP in the last 8760 hours. HbA1C: No results for input(s): HGBA1C in the last 72  hours. CBG: No results for input(s): GLUCAP in the last 168 hours. Lipid Profile: No results for input(s): CHOL, HDL, LDLCALC, TRIG, CHOLHDL, LDLDIRECT in the last 72 hours. Thyroid Function Tests: Recent Labs    09/11/21 1705  TSH 1.412   Anemia Panel: No results for input(s): VITAMINB12, FOLATE, FERRITIN, TIBC, IRON, RETICCTPCT in the last 72 hours.    Radiology Studies: I have reviewed all of the imaging during this hospital visit personally     Scheduled Meds:  irbesartan  150 mg Oral Daily   And   amLODipine  5 mg Oral Daily   And   hydrochlorothiazide  12.5 mg Oral Daily   aspirin  81 mg Oral Daily   Chlorhexidine Gluconate Cloth  6 each Topical Daily   dicyclomine  10 mg Oral BID   fenofibrate  160 mg Oral Daily   lubiprostone  24 mcg Oral BID WC   mometasone-formoterol  2 puff Inhalation BID   pantoprazole  40 mg  Oral Daily   pregabalin  200 mg Oral TID   sucralfate  1 g Oral TID AC & HS   Continuous Infusions:  sodium chloride       LOS: 3 days        Mirella Gueye Gerome Apley, MD

## 2021-09-13 NOTE — Progress Notes (Signed)
Pt wanted to hold off on her bp meds this morning due the pressure being on the low side. Will recheck it later to see how it is.

## 2021-09-13 NOTE — Care Management Important Message (Signed)
Important Message  Patient Details IM Letter given to the Patient. Name: Caitlyn Pacheco MRN: [id number] Date of Birth: [birth year]   Medicare Important Message Given:  Yes     Kerin Salen 09/13/2021, 2:21 PM

## 2021-09-13 NOTE — Progress Notes (Signed)
   Subjective/Chief Complaint: Last BM 2-3 days ago. Still passing flatus but now also belching. Nausea slightly worse from baseline. CP/epigastric pain improved with GI cocktail. Ambulating. No resp complaints. Only eating small bites of food  Objective: Vital signs in last 24 hours: Temp:  [98.9 F (37.2 C)-99.4 F (37.4 C)] 99.4 F (37.4 C) (10/24 0629) Pulse Rate:  [62-88] 63 (10/24 0914) Resp:  [18] 18 (10/24 0629) BP: (109-146)/(52-85) 118/52 (10/24 0914) SpO2:  [96 %-97 %] 97 % (10/24 0846) Last BM Date: 09/12/21  Intake/Output from previous day: 10/23 0701 - 10/24 0700 In: 1400.1 [P.O.:1200; I.V.:200.1] Out: 1100 [Urine:1100] Intake/Output this shift: No intake/output data recorded.  PE: General: pleasant, WD/WN female who is laying in bed in NAD HEENT: head is normocephalic, atraumatic.  Mouth is pink and moist. Heart: regular, rate, and rhythm.  Palpable pedal pulses bilaterally  Lungs: Respiratory effort nonlabored on room air Abd: well healed midline/ RUQ/ and laparoscopic incisions, soft, mild distension, very mild diffuse TTP without rebound or guarding greatest near umbilicus, hypoactive BS MS: calves soft and nontender Skin: warm and dry with no masses, lesions, or rashes Psych: A&Ox4 with an appropriate affect  Lab Results:  Recent Labs    09/10/21 1034  WBC 4.9  HGB 13.6  HCT 41.5  PLT 258    BMET Recent Labs    09/12/21 0301 09/13/21 0312  NA 138 135  K 3.8 3.9  CL 106 103  CO2 25 28  GLUCOSE 122* 112*  BUN 7* 8  CREATININE 0.84 0.88  CALCIUM 8.4* 8.4*    PT/INR No results for input(s): LABPROT, INR in the last 72 hours. ABG No results for input(s): PHART, HCO3 in the last 72 hours.  Invalid input(s): PCO2, PO2  Studies/Results: No results found.  Anti-infectives: Anti-infectives (From admission, onward)    Start     Dose/Rate Route Frequency Ordered Stop   09/12/21 1400  Sofosbuvir-Velpatasvir 400-100 MG TABS 1 tablet   Status:  Discontinued        1 tablet Oral Daily 09/12/21 1312 09/12/21 1342       Assessment/Plan: SBO  -contrast in colon on KUB.   - NG out 10/23. Nausea mildly worsened today. Back down to CLD - Keep K >4 and Mg > 2 for bowel function - Mobilize for bowel function - given mildly worsening SBO symptoms and resolved pain symptoms with GI cocktail recommend holding plavix    VTE - SCDs, okay for chemical prophyalxis from a general surgery standpoint. Hold plavix FEN - CLD Foley - none   CHF-workup for chest pain, anti htn and check ekg, TRH is aware COPD Chronic Pain Syndrome - takes oxycodone 15mg  q6 hours, will restart home med Hep C s/p Epclusa x 12 weeks HTN HLD TIA/CVA on Plavix (last dose 2 weeks PTA)  Thyroid disease Tobacco abuse  Winferd Humphrey PA-C 09/13/2021 Please see Amion for pager number during day hours 7:00am-4:30pm

## 2021-09-14 DIAGNOSIS — B1920 Unspecified viral hepatitis C without hepatic coma: Secondary | ICD-10-CM | POA: Diagnosis not present

## 2021-09-14 DIAGNOSIS — K56609 Unspecified intestinal obstruction, unspecified as to partial versus complete obstruction: Secondary | ICD-10-CM | POA: Diagnosis not present

## 2021-09-14 DIAGNOSIS — J441 Chronic obstructive pulmonary disease with (acute) exacerbation: Secondary | ICD-10-CM | POA: Diagnosis not present

## 2021-09-14 DIAGNOSIS — G894 Chronic pain syndrome: Secondary | ICD-10-CM | POA: Diagnosis not present

## 2021-09-14 MED ORDER — HYDROMORPHONE HCL 1 MG/ML IJ SOLN
1.0000 mg | INTRAMUSCULAR | Status: DC | PRN
  Administered 2021-09-14 – 2021-09-15 (×5): 1 mg via INTRAVENOUS
  Filled 2021-09-14 (×5): qty 1

## 2021-09-14 MED ORDER — BISACODYL 5 MG PO TBEC
10.0000 mg | DELAYED_RELEASE_TABLET | Freq: Once | ORAL | Status: AC
  Administered 2021-09-14: 10 mg via ORAL
  Filled 2021-09-14: qty 2

## 2021-09-14 NOTE — Progress Notes (Signed)
   Subjective/Chief Complaint: BM charted yesterday but patient states she did not have one. Passing good amounts of flatus and both nausea and belching significantly improved. Still with some abdominal pain and bloating. ambulating. She is very eager to eat solid food today  Objective: Vital signs in last 24 hours: Temp:  [98.2 F (36.8 C)-98.5 F (36.9 C)] 98.2 F (36.8 C) (10/25 0545) Pulse Rate:  [63-69] 69 (10/25 0545) Resp:  [18] 18 (10/25 0545) BP: (104-139)/(52-72) 104/60 (10/25 0545) SpO2:  [94 %-100 %] 97 % (10/25 0803) Last BM Date: 09/11/21  Intake/Output from previous day: 10/24 0701 - 10/25 0700 In: 8182 [P.O.:1320; I.V.:100] Out: -  Intake/Output this shift: No intake/output data recorded.  PE: General: pleasant, WD/WN female who is laying in bed in NAD HEENT: head is normocephalic, atraumatic.  Mouth is pink and moist. Heart: regular, rate, and rhythm.  Lungs: Respiratory effort nonlabored on room air Abd: well healed midline/ RUQ/ and laparoscopic incisions, soft, mild distension, very mild diffuse TTP without rebound or guarding greatest near umbilicus, normal BS MS: calves soft and nontender Skin: warm and dry with no masses, lesions, or rashes Psych: A&Ox4 with an appropriate affect  Lab Results:  No results for input(s): WBC, HGB, HCT, PLT in the last 72 hours.  BMET Recent Labs    09/12/21 0301 09/13/21 0312  NA 138 135  K 3.8 3.9  CL 106 103  CO2 25 28  GLUCOSE 122* 112*  BUN 7* 8  CREATININE 0.84 0.88  CALCIUM 8.4* 8.4*    PT/INR No results for input(s): LABPROT, INR in the last 72 hours. ABG No results for input(s): PHART, HCO3 in the last 72 hours.  Invalid input(s): PCO2, PO2  Studies/Results: No results found.  Anti-infectives: Anti-infectives (From admission, onward)    Start     Dose/Rate Route Frequency Ordered Stop   09/12/21 1400  Sofosbuvir-Velpatasvir 400-100 MG TABS 1 tablet  Status:  Discontinued        1 tablet  Oral Daily 09/12/21 1312 09/12/21 1342       Assessment/Plan: SBO  -contrast in colon on KUB.   - NG out 10/23. Nausea improved. Tolerated clears - Keep K >4 and Mg > 2 for bowel function - Mobilize for bowel function    VTE - SCDs, okay for chemical prophyalxis from a general surgery standpoint. Hold plavix FEN - FLD ADAT soft Foley - none   CHF-workup for chest pain, anti htn and check ekg, TRH is aware COPD Chronic Pain Syndrome - takes oxycodone 15mg  q6 hours, will restart home med Hep C s/p Epclusa x 12 weeks HTN HLD TIA/CVA on Plavix (last dose 2 weeks PTA)  Thyroid disease Tobacco abuse  Winferd Humphrey PA-C 09/14/2021 Please see Amion for pager number during day hours 7:00am-4:30pm

## 2021-09-14 NOTE — Discharge Summary (Signed)
Physician Discharge Summary  Caitlyn Pacheco:[id number] DOB: 1956/05/16 DOA: 09/10/2021  PCP: Harvie Junior, MD  Admit date: 09/10/2021 Discharge date: 09/14/2021  Admitted From: Home  Disposition:  Home   Recommendations for Outpatient Follow-up and new medication changes:  Follow up with Dr. Jimmye Norman in 7 to 10 days.    Home Health: no   Equipment/Devices: no    Discharge Condition: stable  CODE STATUS: full  Diet recommendation:  soft diet  Brief/Interim Summary: Mrs. Gates was admitted to the hospital with the working diagnosis of small bowel obstruction.    65 year old female past medical history for anxiety, chronic diastolic heart failure, COPD, GERD, hepatitis C, dyslipidemia and hypertension, she also has history of hemorrhagic CVA, osteoarthritis and osteoporosis.  Multiple prior abdominal surgeries and small bowel obstructions.  She reported 4 weeks of progressively worsening constant abdominal pain, associate with mild distention, nausea and occasional diarrhea.  Decreased appetite, fatigue and weight loss.  Patient had acute worsening abdominal pain that prompted her to come to the hospital.  On her initial physical examination temperature 98.7, heart rate 67, respiratory rate 18, blood pressure 189/105, oxygen saturation 100% on room air.  She had dry mucous membranes, her lungs were clear to auscultation bilaterally, heart S1-S2, present, rhythmic, the abdomen was mildly distended, mildly tender to palpation diffusely, no rebound or guarding, no lower extremity edema.   Sodium 134, potassium 3.6, chloride 98, bicarb 27, glucose 108, BUN 11, creatinine 0.69, magnesium 1.7, white count 4.9, hemoglobin 13.6, hematocrit 41.5, platelets 258. SARS COVID-19 negative.   CT of the abdomen pelvis with small bowel obstruction pattern in the mid small bowel.  Transition point along the ventral peritoneal wall left of midline.  Suspect adhesions related to prior surgery.  No  pneumatosis or portal venous gas.  No perforation.     EKG 67 bpm, normal axis, normal intervals, sinus rhythm, concave ST elevation in lead II, lead III, aVF, no significant T wave changes.   Patient received supportive medical therapy including intravenous fluids, analgesics and antiemetics. Nasogastric was tube for decompression with good toleration.   Her symptoms improved, positive bowel movements with no nausea or vomiting. NG tube removed 09/12/21.    Diet was advanced to liquids then soft. Patient will follow up as outpatient.   Small bowel obstruction.  Patient was admitted to the medical ward, received supportive medical therapy with intravenous fluids, antiacid therapy, as needed analgesics and antiemetics. She did require nasogastric tube for intermittent suction. Her symptoms improved, positive bowel movement, nasogastric tube was removed and diet was advanced.  She will continue with soft diet for the next few days, follow-up as an outpatient.  2.  Hyponatremia.  Patient received intravenous fluids, her kidney function remained stable, at discharge sodium 135, potassium 3.9, chloride 103, bicarb 28, glucose 112, BUN 8, creatinine 0.88.  3.  Hypertension.  Dyslipidemia.  Once patient able to take by mouth medications her antihypertensive regimen was resumed with olmesartan, amlodipine and hydrochlorothiazide. Continue fenofibrate. Patient did have chest pain that rule out for acute coronary syndrome.  4.  COPD.  No signs of acute exacerbation, continue inhalers and inhaled steroids.  5.  Anxiety, chronic pain syndrome.  Patient continue as needed diazepam, pregabalin and oxycodone.  6.  Hepatitis C.  Patient completed antiviral therapy as an outpatient.  Discharge Diagnoses:  Principal Problem:   SBO (small bowel obstruction) (HCC) Active Problems:   HTN (hypertension)   Chronic pain syndrome   Hepatitis C  COPD (chronic obstructive pulmonary disease) (HCC)    GERD (gastroesophageal reflux disease)   Hyponatremia    Discharge Instructions  Discharge Instructions     Diet - low sodium heart healthy   Complete by: As directed    Discharge instructions   Complete by: As directed    Please follow up with primary care in 7 to 10 days.   Increase activity slowly   Complete by: As directed       Allergies as of 09/14/2021       Reactions   Morphine And Related Other (See Comments), Hives, Itching   Per her doctor Per her doctor   Acetaminophen Other (See Comments)   Liver enlargement- hep C   Ibuprofen    Per her doctor   Nsaids    Per her doctor   Prednisone Other (See Comments)   Per her doctor   Statins Other (See Comments)   Affected liver enzymes Affected liver enzymes        Medication List     STOP taking these medications    Fluticasone-Salmeterol 250-50 MCG/DOSE Aepb Commonly known as: ADVAIR   Sofosbuvir-Velpatasvir 400-100 MG Tabs Commonly known as: Epclusa       TAKE these medications    albuterol 108 (90 Base) MCG/ACT inhaler Commonly known as: VENTOLIN HFA Inhale 2 puffs into the lungs every 6 (six) hours as needed for wheezing or shortness of breath.   albuterol 1.25 MG/3ML nebulizer solution Commonly known as: ACCUNEB Take 3 mLs by nebulization 3 (three) times daily as needed for wheezing or shortness of breath.   aspirin 81 MG chewable tablet Chew 81 mg by mouth daily.   budesonide-formoterol 160-4.5 MCG/ACT inhaler Commonly known as: SYMBICORT Inhale 2 puffs into the lungs 2 (two) times daily.   clopidogrel 75 MG tablet Commonly known as: PLAVIX Take 75 mg by mouth daily.   diazepam 5 MG tablet Commonly known as: VALIUM Take 5 mg by mouth 3 (three) times daily as needed.   diclofenac 25 MG EC tablet Commonly known as: VOLTAREN Take 25 mg by mouth daily as needed for moderate pain or mild pain.   dicyclomine 10 MG capsule Commonly known as: BENTYL Take 1 capsule (10 mg total) by  mouth 2 (two) times daily.   fenofibrate 160 MG tablet Take 160 mg by mouth daily.   fluticasone 50 MCG/ACT nasal spray Commonly known as: FLONASE Place 2 sprays into both nostrils daily as needed for allergies or rhinitis.   Fluticasone-Umeclidin-Vilant 100-62.5-25 MCG/ACT Aepb Take 1 puff by mouth daily.   furosemide 40 MG tablet Commonly known as: LASIX Take 0.5 tablets (20 mg total) by mouth daily.   lubiprostone 24 MCG capsule Commonly known as: AMITIZA Take 24 mcg by mouth 2 (two) times daily with a meal.   naloxone 4 MG/0.1ML Liqd nasal spray kit Commonly known as: NARCAN Place 1 spray into the nose once. INTO 1 NOSTRIL AND MAY REPEAT EVERY 2-3 MINUTES UNTIL EMERGENCY ARRIVES   Olmesartan-amLODIPine-HCTZ 20-5-12.5 MG Tabs Take 1 tablet by mouth daily.   oxyCODONE 15 MG immediate release tablet Commonly known as: ROXICODONE Take 15 mg by mouth 4 (four) times daily as needed for pain.   pantoprazole 40 MG tablet Commonly known as: PROTONIX Take 1 tablet (40 mg total) by mouth 2 (two) times daily. For 4 weeks and then decrease to daily   pregabalin 200 MG capsule Commonly known as: LYRICA Take 200 mg by mouth 3 (three) times daily.  SUMAtriptan 100 MG tablet Commonly known as: IMITREX Take 1 tablet by mouth 2 (two) times daily as needed. Take 1 tablet by mouth twice daily, at least 2 hours between doses as needed        Follow-up Information     Harvie Junior, MD Follow up in 1 week(s).   Specialty: Family Medicine Contact information: [place] [address] [phone]                Allergies  Allergen Reactions   Morphine And Related Other (See Comments), Hives and Itching    Per her doctor Per her doctor   Acetaminophen Other (See Comments)    Liver enlargement- hep C   Ibuprofen     Per her doctor   Nsaids     Per her doctor   Prednisone Other (See Comments)    Per her doctor   Statins Other (See Comments)     Affected liver enzymes Affected liver enzymes    Consultations: Surgery    Procedures/Studies: DG Abdomen 1 View  Result Date: 09/10/2021 CLINICAL DATA:  Post NGT insertion. EXAM: ABDOMEN - 1 VIEW COMPARISON:  CT abdomen/pelvis 09/10/2021. FINDINGS: A problem-oriented radiograph of the upper abdomen was performed to assess enteric tube placement. An enteric tube passes below the level of the left hemidiaphragm with tip projecting in the expected location of the gastric antrum/pylorus. Findings of small-bowel obstruction were better appreciated on the CT abdomen/pelvis performed earlier today. The imaged lung bases are clear. IMPRESSION: An enteric tube passes below level of left hemidiaphragm with tip projecting in the expected location of the gastric antrum/pylorus. Electronically Signed   By: Kellie Simmering D.O.   On: 09/10/2021 12:45   CT ABDOMEN PELVIS W CONTRAST  Result Date: 09/10/2021 CLINICAL DATA:  Chronic abdominal pain.  Worsening pain. EXAM: CT ABDOMEN AND PELVIS WITH CONTRAST TECHNIQUE: Multidetector CT imaging of the abdomen and pelvis was performed using the standard protocol following bolus administration of intravenous contrast. CONTRAST:  112m OMNIPAQUE IOHEXOL 300 MG/ML  SOLN COMPARISON:  CT 04/06/2017 FINDINGS: Lower chest: Lung bases are clear. Hepatobiliary: No focal hepatic lesion. No biliary duct dilatation. Common bile duct is mildly dilated following cholecystectomy. Pancreas: Pancreas is normal. No ductal dilatation. No pancreatic inflammation. Spleen: Normal spleen Adrenals/urinary tract: Adrenal glands normal. Kidneys, ureters and bladder normal. Stomach/Bowel: Oral contrast within the stomach, duodenum and proximal small bowel. No contrast in the distal small bowel. There is evidence of stasis of enteric contents in the mid small bowel. Small bowel is dilated to 3.4 cm (image 43/2). A potential transition point identified on coronal image 80/series 5 along the  ventral peritoneal surface just LEFT of midline (image 47/2). No pneumatosis or portal venous gas.  No bowel perforation. Terminal ileum is normal.  Colon is relatively decompressed. There is a small bowel small bowel anastomosis in the RIGHT lower quadrant (image 60/2) indicating prior abdominal surgery. Vascular/Lymphatic: Abdominal aorta is normal caliber with atherosclerotic calcification. There is no retroperitoneal or periportal lymphadenopathy. No pelvic lymphadenopathy. Reproductive: Post hysterectomy.  Adnexa unremarkable Other: No free fluid. Musculoskeletal: No aggressive osseous lesion. IMPRESSION: 1. Small bowel obstruction pattern in the mid small bowel. Transition point along the ventral peritoneal wall LEFT of midline. Suspect adhesions related to prior surgery. 2. No pneumatosis or portal venous gas.  No perforation. Findings conveyed to SDrema Dallas10/21/2022  at11:32. Electronically Signed   By: SSuzy BouchardM.D.   On: 09/10/2021 11:33   DG  Abd Portable 1V-Small Bowel Obstruction Protocol-initial, 8 hr delay  Result Date: 09/11/2021 CLINICAL DATA:  A 65 year old female presents with small bowel obstruction postcontrast administration on the floor for assessment. Is EXAM: PORTABLE ABDOMEN - 1 VIEW COMPARISON:  Comparison made with CT examination of September 10, 2021. FINDINGS: Nasogastric tube terminates in the stomach. Side port below the expected location of the GE junction. Lung bases with basilar atelectasis. Mildly dilated to moderately dilated small bowel loops in the LEFT and lower abdomen. Passage of contrast into the colon and passing essentially throughout the entire colon on the current study. On limited assessment there is no acute skeletal process. IMPRESSION: Nasogastric tube terminates in the stomach. Mildly dilated to moderately dilated small bowel loops in the LEFT and lower abdomen, but with passage of contrast into the colon. Basilar atelectasis. Electronically Signed    By: Zetta Bills M.D.   On: 09/11/2021 08:03      Subjective: Patient is feeling better, no nausea, no vomiting.  If she has a bowel movement today she will be discharged home.  Follow-up as an outpatient.  Discharge Exam: Vitals:   09/14/21 0545 09/14/21 0803  BP: 104/60   Pulse: 69   Resp: 18   Temp: 98.2 F (36.8 C)   SpO2: 97% 97%   Vitals:   09/13/21 1928 09/13/21 2007 09/14/21 0545 09/14/21 0803  BP:  123/68 104/60   Pulse:  63 69   Resp:  18 18   Temp:  98.5 F (36.9 C) 98.2 F (36.8 C)   TempSrc:  Oral Oral   SpO2: 95% 94% 97% 97%  Weight:      Height:        General: Not in pain or dyspnea  Neurology: Awake and alert, non focal  E ENT: no pallor, no icterus, oral mucosa moist Cardiovascular: No JVD. S1-S2 present, rhythmic, no gallops, rubs, or murmurs. No lower extremity edema. Pulmonary: vesicular breath sounds bilaterally, adequate air movement, no wheezing, rhonchi or rales. Gastrointestinal. Abdomen soft and non tender Skin. No rashes Musculoskeletal: no joint deformities   The results of significant diagnostics from this hospitalization (including imaging, microbiology, ancillary and laboratory) are listed below for reference.     Microbiology: Recent Results (from the past 240 hour(s))  Resp Panel by RT-PCR (Flu A&B, Covid) Nasopharyngeal Swab     Status: None   Collection Time: 09/10/21 11:55 AM   Specimen: Nasopharyngeal Swab; Nasopharyngeal(NP) swabs in vial transport medium  Result Value Ref Range Status   SARS Coronavirus 2 by RT PCR NEGATIVE NEGATIVE Final    Comment: (NOTE) SARS-CoV-2 target nucleic acids are NOT DETECTED.  The SARS-CoV-2 RNA is generally detectable in upper respiratory specimens during the acute phase of infection. The lowest concentration of SARS-CoV-2 viral copies this assay can detect is 138 copies/mL. A negative result does not preclude SARS-Cov-2 infection and should not be used as the sole basis for treatment  or other patient management decisions. A negative result may occur with  improper specimen collection/handling, submission of specimen other than nasopharyngeal swab, presence of viral mutation(s) within the areas targeted by this assay, and inadequate number of viral copies(<138 copies/mL). A negative result must be combined with clinical observations, patient history, and epidemiological information. The expected result is Negative.  Fact Sheet for Patients:  EntrepreneurPulse.com.au  Fact Sheet for Healthcare Providers:  IncredibleEmployment.be  This test is no t yet approved or cleared by the [place] FDA and  has been authorized for detection and/or  diagnosis of SARS-CoV-2 by FDA under an Emergency Use Authorization (EUA). This EUA will remain  in effect (meaning this test can be used) for the duration of the COVID-19 declaration under Section 564(b)(1) of the Act, 21 U.S.C.section 360bbb-3(b)(1), unless the authorization is terminated  or revoked sooner.       Influenza A by PCR NEGATIVE NEGATIVE Final   Influenza B by PCR NEGATIVE NEGATIVE Final    Comment: (NOTE) The Xpert Xpress SARS-CoV-2/FLU/RSV plus assay is intended as an aid in the diagnosis of influenza from Nasopharyngeal swab specimens and should not be used as a sole basis for treatment. Nasal washings and aspirates are unacceptable for Xpert Xpress SARS-CoV-2/FLU/RSV testing.  Fact Sheet for Patients: EntrepreneurPulse.com.au  Fact Sheet for Healthcare Providers: IncredibleEmployment.be  This test is not yet approved or cleared by the [place] FDA and has been authorized for detection and/or diagnosis of SARS-CoV-2 by FDA under an Emergency Use Authorization (EUA). This EUA will remain in effect (meaning this test can be used) for the duration of the COVID-19 declaration under Section 564(b)(1) of the Act, 21 U.S.C. section  360bbb-3(b)(1), unless the authorization is terminated or revoked.  Performed at [facility], [address] [place], [place] [address]      Labs: BNP (last 3 results) No results for input(s): BNP in the last 8760 hours. Basic Metabolic Panel: Recent Labs  Lab 09/10/21 1034 09/11/21 1705 09/12/21 0301 09/13/21 0312  NA 134* 136 138 135  K 3.6 3.8 3.8 3.9  CL 98 107 106 103  CO2 _0 GLUCOSE 108* 116* 122* 112*  BUN 11 9 7* 8  CREATININE 0.69 0.74 0.84 0.88  CALCIUM 8.8* 7.9* 8.4* 8.4*  MG 1.7  --   --   --    Liver Function Tests: Recent Labs  Lab 09/10/21 1034  AST 20  ALT 13  ALKPHOS 84  BILITOT 0.5  PROT 6.9  ALBUMIN 3.8   Recent Labs  Lab 09/10/21 1034  LIPASE 21   No results for input(s): AMMONIA in the last 168 hours. CBC: Recent Labs  Lab 09/10/21 1034  WBC 4.9  NEUTROABS 2.7  HGB 13.6  HCT 41.5  MCV 72.6*  PLT 258   Cardiac Enzymes: No results for input(s): CKTOTAL, CKMB, CKMBINDEX, TROPONINI in the last 168 hours. BNP: Invalid input(s): POCBNP CBG: No results for input(s): GLUCAP in the last 168 hours. D-Dimer No results for input(s): DDIMER in the last 72 hours. Hgb A1c No results for input(s): HGBA1C in the last 72 hours. Lipid Profile No results for input(s): CHOL, HDL, LDLCALC, TRIG, CHOLHDL, LDLDIRECT in the last 72 hours. Thyroid function studies Recent Labs    09/11/21 1705  TSH 1.412   Anemia work up No results for input(s): VITAMINB12, FOLATE, FERRITIN, TIBC, IRON, RETICCTPCT in the last 72 hours. Urinalysis No results found for: COLORURINE, APPEARANCEUR, [place], [place], GLUCOSEU, [place], [place], [place], PROTEINUR, UROBILINOGEN, NITRITE, LEUKOCYTESUR Sepsis Labs Invalid input(s): PROCALCITONIN,  WBC,  LACTICIDVEN Microbiology Recent Results (from the past 240 hour(s))  Resp Panel by RT-PCR (Flu A&B, Covid) Nasopharyngeal Swab     Status: None   Collection Time: 09/10/21 11:55 AM    Specimen: Nasopharyngeal Swab; Nasopharyngeal(NP) swabs in vial transport medium  Result Value Ref Range Status   SARS Coronavirus 2 by RT PCR NEGATIVE NEGATIVE Final    Comment: (NOTE) SARS-CoV-2 target nucleic acids are NOT DETECTED.  The SARS-CoV-2 RNA is generally detectable in upper respiratory  specimens during the acute phase of infection. The lowest concentration of SARS-CoV-2 viral copies this assay can detect is 138 copies/mL. A negative result does not preclude SARS-Cov-2 infection and should not be used as the sole basis for treatment or other patient management decisions. A negative result may occur with  improper specimen collection/handling, submission of specimen other than nasopharyngeal swab, presence of viral mutation(s) within the areas targeted by this assay, and inadequate number of viral copies(<138 copies/mL). A negative result must be combined with clinical observations, patient history, and epidemiological information. The expected result is Negative.  Fact Sheet for Patients:  EntrepreneurPulse.com.au  Fact Sheet for Healthcare Providers:  IncredibleEmployment.be  This test is no t yet approved or cleared by the [place] FDA and  has been authorized for detection and/or diagnosis of SARS-CoV-2 by FDA under an Emergency Use Authorization (EUA). This EUA will remain  in effect (meaning this test can be used) for the duration of the COVID-19 declaration under Section 564(b)(1) of the Act, 21 U.S.C.section 360bbb-3(b)(1), unless the authorization is terminated  or revoked sooner.       Influenza A by PCR NEGATIVE NEGATIVE Final   Influenza B by PCR NEGATIVE NEGATIVE Final    Comment: (NOTE) The Xpert Xpress SARS-CoV-2/FLU/RSV plus assay is intended as an aid in the diagnosis of influenza from Nasopharyngeal swab specimens and should not be used as a sole basis for treatment. Nasal washings and aspirates are  unacceptable for Xpert Xpress SARS-CoV-2/FLU/RSV testing.  Fact Sheet for Patients: EntrepreneurPulse.com.au  Fact Sheet for Healthcare Providers: IncredibleEmployment.be  This test is not yet approved or cleared by the [place] FDA and has been authorized for detection and/or diagnosis of SARS-CoV-2 by FDA under an Emergency Use Authorization (EUA). This EUA will remain in effect (meaning this test can be used) for the duration of the COVID-19 declaration under Section 564(b)(1) of the Act, 21 U.S.C. section 360bbb-3(b)(1), unless the authorization is terminated or revoked.  Performed at [facility], [address], [place], [place] [address]      Time coordinating discharge: 45 minutes  SIGNED:   Tawni Millers, MD  [facility] 09/14/2021, 10:58 AM

## 2021-09-15 ENCOUNTER — Telehealth: Payer: Self-pay | Admitting: Gastroenterology

## 2021-09-15 ENCOUNTER — Inpatient Hospital Stay (HOSPITAL_COMMUNITY): Payer: Medicare Other

## 2021-09-15 DIAGNOSIS — E039 Hypothyroidism, unspecified: Secondary | ICD-10-CM | POA: Diagnosis present

## 2021-09-15 DIAGNOSIS — Z8673 Personal history of transient ischemic attack (TIA), and cerebral infarction without residual deficits: Secondary | ICD-10-CM

## 2021-09-15 DIAGNOSIS — K56609 Unspecified intestinal obstruction, unspecified as to partial versus complete obstruction: Secondary | ICD-10-CM | POA: Diagnosis not present

## 2021-09-15 DIAGNOSIS — F17200 Nicotine dependence, unspecified, uncomplicated: Secondary | ICD-10-CM | POA: Diagnosis present

## 2021-09-15 LAB — GLUCOSE, CAPILLARY
Glucose-Capillary: 137 mg/dL — ABNORMAL HIGH (ref 70–99)
Glucose-Capillary: 140 mg/dL — ABNORMAL HIGH (ref 70–99)

## 2021-09-15 MED ORDER — SORBITOL 70 % SOLN
30.0000 mL | Freq: Once | Status: AC
  Administered 2021-09-15: 30 mL via ORAL
  Filled 2021-09-15: qty 30

## 2021-09-15 MED ORDER — POLYETHYLENE GLYCOL 3350 17 G PO PACK
17.0000 g | PACK | Freq: Two times a day (BID) | ORAL | Status: DC
  Administered 2021-09-15 – 2021-09-16 (×2): 17 g via ORAL
  Filled 2021-09-15 (×2): qty 1

## 2021-09-15 MED ORDER — POLYETHYLENE GLYCOL 3350 17 G PO PACK
17.0000 g | PACK | Freq: Every day | ORAL | Status: DC
  Administered 2021-09-15: 17 g via ORAL
  Filled 2021-09-15 (×2): qty 1

## 2021-09-15 MED ORDER — BISACODYL 10 MG RE SUPP
10.0000 mg | Freq: Once | RECTAL | Status: AC
  Administered 2021-09-15: 10 mg via RECTAL
  Filled 2021-09-15: qty 1

## 2021-09-15 MED ORDER — SENNOSIDES-DOCUSATE SODIUM 8.6-50 MG PO TABS
1.0000 | ORAL_TABLET | Freq: Two times a day (BID) | ORAL | Status: DC
  Administered 2021-09-16: 1 via ORAL
  Filled 2021-09-15 (×3): qty 1

## 2021-09-15 MED ORDER — LUBIPROSTONE 24 MCG PO CAPS
24.0000 ug | ORAL_CAPSULE | Freq: Three times a day (TID) | ORAL | Status: DC
  Administered 2021-09-16: 24 ug via ORAL
  Filled 2021-09-15 (×4): qty 1

## 2021-09-15 MED ORDER — DIAZEPAM 5 MG PO TABS
10.0000 mg | ORAL_TABLET | Freq: Three times a day (TID) | ORAL | Status: DC | PRN
  Administered 2021-09-15: 10 mg via ORAL
  Filled 2021-09-15: qty 2

## 2021-09-15 NOTE — Progress Notes (Signed)
   Subjective/Chief Complaint: Still having some abdominal pain today. Last BM charted on 11/24 and she is feeling constipated. Nausea is stable (she has baseline nausea). Ate majority of her soft breakfast this am. Passing flatus and ambulating frequently  Objective: Vital signs in last 24 hours: Temp:  [97.4 F (36.3 C)-98.3 F (36.8 C)] 98.3 F (36.8 C) (10/26 0643) Pulse Rate:  [63-72] 72 (10/26 0643) Resp:  [16-18] 18 (10/26 0643) BP: (122-141)/(66-76) 127/76 (10/26 0643) SpO2:  [92 %-100 %] 92 % (10/26 0747) Last BM Date: 09/11/21  Intake/Output from previous day: 10/25 0701 - 10/26 0700 In: 2160 [P.O.:2040; I.V.:120] Out: -  Intake/Output this shift: Total I/O In: 120 [P.O.:120] Out: -   PE: General: pleasant, WD/WN female who is laying in bed in NAD HEENT: head is normocephalic, atraumatic.  Mouth is pink and moist. Heart: regular, rate, and rhythm.  Lungs: Respiratory effort nonlabored on room air Abd: well healed midline/ RUQ/ and laparoscopic incisions, soft, mild distension, very TTP without rebound or guarding superior to umbilicus, normal BS MS: calves soft and nontender Skin: warm and dry with no masses, lesions, or rashes Psych: A&Ox4 with an appropriate affect  Lab Results:  No results for input(s): WBC, HGB, HCT, PLT in the last 72 hours.  BMET Recent Labs    09/13/21 0312  NA 135  K 3.9  CL 103  CO2 28  GLUCOSE 112*  BUN 8  CREATININE 0.88  CALCIUM 8.4*    PT/INR No results for input(s): LABPROT, INR in the last 72 hours. ABG No results for input(s): PHART, HCO3 in the last 72 hours.  Invalid input(s): PCO2, PO2  Studies/Results: No results found.  Anti-infectives: Anti-infectives (From admission, onward)    Start     Dose/Rate Route Frequency Ordered Stop   09/12/21 1400  Sofosbuvir-Velpatasvir 400-100 MG TABS 1 tablet  Status:  Discontinued        1 tablet Oral Daily 09/12/21 1312 09/12/21 1342       Assessment/Plan: SBO   -contrast in colon on KUB 10/22 and several Bms after contrast administration.   - NG out 10/23. Nausea stable. Tolerating soft diet - Keep K >4 and Mg > 2 for bowel function - Mobilize for bowel function - suppository for constipation which is likely the cause of her acute on chronic abdominal pain  She is stable for discharge home from surgical perspective  VTE - SCDs, okay for chemical prophyalxis from a general surgery standpoint. Okay to resume plavix FEN - soft Foley - none   CHF Chronic Pain Syndrome - takes oxycodone 15mg  q6 hours, will restart home med Hep C s/p Epclusa x 12 weeks HTN HLD TIA/CVA on Plavix (last dose 2 weeks PTA)  Thyroid disease Tobacco abuse  Winferd Humphrey PA-C 09/15/2021 Please see Amion for pager number during day hours 7:00am-4:30pm

## 2021-09-15 NOTE — Hospital Course (Addendum)
Caitlyn Pacheco is a 65 y.o. F with dCHF, COPD not on O2, hep C s/p Epclusa, hx CVA, HTN, and recurrent bowel obstruction who presented with 4 weeks of progressively worsening constant abdominal pain, associate with mild distention, nausea and occasional diarrhea.   Ultimately had acute worsening abdominal pain that prompted her to come to the hospital.  In the ER, electrolytes and renal function normal, CBC normal.  CT of the abdomen pelvis with small bowel obstruction pattern in the mid small bowel.  Transition point along the ventral peritoneal wall left of midline.  Suspect adhesions related to prior surgery.  No pneumatosis or portal venous gas.  No perforation.   NG placed and admitted.

## 2021-09-15 NOTE — Progress Notes (Signed)
Patient requesting laxative/medication to assist with BM. On-Call MD Olena Heckle) changed current Miralax order to administer 0800 dose early. Patient made aware and medication given.

## 2021-09-15 NOTE — Assessment & Plan Note (Addendum)
No evidence of flare -Continue ICS/lab

## 2021-09-15 NOTE — Assessment & Plan Note (Signed)
Stable

## 2021-09-15 NOTE — Assessment & Plan Note (Signed)
-   Continue PPI and sucralfate

## 2021-09-15 NOTE — Assessment & Plan Note (Signed)
Treated and resolved °

## 2021-09-15 NOTE — Assessment & Plan Note (Signed)
-   Continue aspirin, fenofibrate

## 2021-09-15 NOTE — Telephone Encounter (Signed)
Forwarding this message to Dr. Lyndel Safe as this is his patient he knows well. If he is out today then should go to Doc of Day. I am not DOD today. Thanks

## 2021-09-15 NOTE — Progress Notes (Signed)
Progress Note    Caitlyn Pacheco   NLZ:[id number]  DOB: 07-Aug-1956  DOA: 09/10/2021     5 Date of Service: 09/15/2021     Brief summary: 65 year old female past medical history for anxiety, chronic diastolic heart failure, COPD, GERD, hepatitis C, dyslipidemia and hypertension, she also has history of hemorrhagic CVA, osteoarthritis and osteoporosis.  Multiple prior abdominal surgeries and small bowel obstructions.  She reported 4 weeks of progressively worsening constant abdominal pain, associate with mild distention, nausea and occasional diarrhea.    CT of the abdomen pelvis with small bowel obstruction pattern in the mid small bowel.  Transition point along the ventral peritoneal wall left of midline.  Suspect adhesions related to prior surgery.  No pneumatosis or portal venous gas.  No perforation.    Her symptoms improved, positive bowel movements with no nausea or vomiting. NG tube removed 09/12/21.    Diet has been advanced but she still has severe constipation, unable to have bowel movement        Assessment and Plan * SBO (small bowel obstruction) (HCC) Still no bowel movement, probably fecal impaction - Resume home bowel regimen with Amitiza, twice daily MiraLAX and senna - Try suppository  Chronic pain syndrome - Continue home oxycodone - Continue Lyrica, Valium  HTN (hypertension) Blood pressure controlled - Continue amlodipine, irbesartan, and HCTZ  COPD (chronic obstructive pulmonary disease) (HCC) No evidence of flare -Continue ICS/lab  Hepatitis C Treated and resolved  History of transient ischemic attack (TIA) - Continue aspirin, fenofibrate  Hypothyroidism (acquired) Not on levothyroxine  Hyponatremia Stable  GERD (gastroesophageal reflux disease) - Continue PPI and sucralfate     Subjective:  Still constipated.  No fever, chest pain.  No distention.  No vomiting.  Objective Vitals:   09/14/21 2052 09/15/21 0643 09/15/21 0747  09/15/21 1420  BP: (!) 141/66 127/76  (!) 156/79  Pulse: 71 72  63  Resp: 18 18  15   Temp: 98.3 F (36.8 C) 98.3 F (36.8 C)  98.7 F (37.1 C)  TempSrc: Oral Oral  Oral  SpO2: 95% 95% 92% 97%  Weight:      Height:       88.1 kg  Vital signs were reviewed and unremarkable except for: Pretension   Exam Physical Exam Constitutional:      General: She is not in acute distress. HENT:     Nose: No nasal deformity or rhinorrhea.     Mouth/Throat:     Lips: Pink. No lesions.     Mouth: Mucous membranes are moist. No oral lesions.     Dentition: Normal dentition.     Pharynx: Oropharynx is clear. No posterior oropharyngeal erythema.  Eyes:     General: Lids are normal. Gaze aligned appropriately.     Extraocular Movements: Extraocular movements intact.     Conjunctiva/sclera: Conjunctivae normal.  Cardiovascular:     Rate and Rhythm: Normal rate and regular rhythm.     Pulses:          Radial pulses are 2+ on the right side and 2+ on the left side.     Heart sounds: Normal heart sounds, S1 normal and S2 normal. No murmur heard. Pulmonary:     Effort: Pulmonary effort is normal. No respiratory distress.     Breath sounds: No wheezing or rales.  Abdominal:     General: There is no distension.     Palpations: Abdomen is soft.     Tenderness: There is no abdominal tenderness.  There is no guarding or rebound.  Musculoskeletal:     Right lower leg: No edema.     Left lower leg: No edema.  Skin:    General: Skin is warm and dry.     Findings: No lesion or rash.  Neurological:     Mental Status: She is alert and oriented to person, place, and time.     Motor: No weakness.  Psychiatric:        Attention and Perception: Attention normal.        Mood and Affect: Mood and affect normal.        Behavior: Behavior is cooperative.        Cognition and Memory: Memory normal.        Judgment: Judgment normal.      Labs / Other Information My review of labs, imaging, notes and  other tests shows no new significant findings.    Disposition Plan: Status is: Inpatient  Remains inpatient appropriate because: Unresolved bowel obstruction        Time spent: 35 minutes [facility] 09/15/2021, 7:30 PM

## 2021-09-15 NOTE — Telephone Encounter (Signed)
Inbound call from patient states she was admitted in [facility] 10/21. They put a tube to help use the restroom and everything was fine but they took it out 10/23 and have not had a bowel movement since then. She states she is on a soft diet and needs to speak with Dr. Lyndel Safe or a nurse. Best contact number [phone]

## 2021-09-15 NOTE — Assessment & Plan Note (Addendum)
Not on levothyroxine

## 2021-09-15 NOTE — Assessment & Plan Note (Signed)
Still no bowel movement, probably fecal impaction - Resume home bowel regimen with Amitiza, twice daily MiraLAX and senna - Try suppository

## 2021-09-15 NOTE — Telephone Encounter (Signed)
Pt called from the [facility] as a pt. Pt states that they are going to discharge her and she has not had a BM in several day. I informed the pt at this point she is under the care of the Hospital Dr. I personally spoke to pt RN Manuela Schwartz.RN stating that the are taking care of pt. And the Pt has been ambulating in the hallways, passing gas, positive Bowel Sounds, awaiting BM. Pt verbalized understanding with all questions answered

## 2021-09-15 NOTE — Telephone Encounter (Signed)
Dr. Havery Moros, I apologize for the mix up. I thought I selected his nurse Remo Lipps name instead of yours. Again, my mistake and thank you for forwarding it to Dr. Lyndel Safe and Remo Lipps for me.

## 2021-09-15 NOTE — Assessment & Plan Note (Signed)
Blood pressure controlled - Continue amlodipine, irbesartan, and HCTZ

## 2021-09-15 NOTE — Assessment & Plan Note (Addendum)
-   Continue home oxycodone - Continue Lyrica, Valium

## 2021-09-16 DIAGNOSIS — Z8673 Personal history of transient ischemic attack (TIA), and cerebral infarction without residual deficits: Secondary | ICD-10-CM

## 2021-09-16 DIAGNOSIS — K56609 Unspecified intestinal obstruction, unspecified as to partial versus complete obstruction: Secondary | ICD-10-CM | POA: Diagnosis not present

## 2021-09-16 DIAGNOSIS — B1920 Unspecified viral hepatitis C without hepatic coma: Secondary | ICD-10-CM | POA: Diagnosis not present

## 2021-09-16 DIAGNOSIS — G894 Chronic pain syndrome: Secondary | ICD-10-CM | POA: Diagnosis not present

## 2021-09-16 DIAGNOSIS — J441 Chronic obstructive pulmonary disease with (acute) exacerbation: Secondary | ICD-10-CM | POA: Diagnosis not present

## 2021-09-16 MED ORDER — SORBITOL 70 % SOLN
960.0000 mL | TOPICAL_OIL | Freq: Once | ORAL | Status: AC
  Administered 2021-09-16: 960 mL via RECTAL
  Filled 2021-09-16: qty 473

## 2021-09-16 MED ORDER — HEPARIN SOD (PORK) LOCK FLUSH 100 UNIT/ML IV SOLN
500.0000 [IU] | INTRAVENOUS | Status: AC | PRN
Start: 2021-09-16 — End: 2021-09-16
  Administered 2021-09-16: 500 [IU]
  Filled 2021-09-16: qty 5

## 2021-09-16 NOTE — Discharge Summary (Signed)
Physician Discharge Summary   Patient name: Caitlyn Pacheco  Admit date:     09/10/2021  Discharge date: 09/16/2021  Discharge Physician: Edwin Dada   PCP: Harvie Junior, MD   Recommendations at discharge:  Follow up with PCP in 1 week     Discharge Diagnoses Principal Problem:   SBO (small bowel obstruction) (HCC) Active Problems:   HTN (hypertension)   Chronic pain syndrome   COPD (chronic obstructive pulmonary disease) (HCC)   Hepatitis C   Hyponatremia   Hypothyroidism (acquired)   History of transient ischemic attack (TIA)   Smoking   GERD (gastroesophageal reflux disease)     Hospital Course   Caitlyn Pacheco is a 65 y.o. F with dCHF, COPD not on O2, hep C s/p Epclusa, hx CVA, HTN, and recurrent bowel obstruction who presented with 4 weeks of progressively worsening constant abdominal pain, associate with mild distention, nausea and occasional diarrhea.   Ultimately had acute worsening abdominal pain that prompted her to come to the hospital.  In the ER, electrolytes and renal function normal, CBC normal.  CT of the abdomen pelvis with small bowel obstruction pattern in the mid small bowel.  Transition point along the ventral peritoneal wall left of midline.  Suspect adhesions related to prior surgery.  No pneumatosis or portal venous gas.  No perforation.   NG placed and admitted.    * SBO (small bowel obstruction) ([place]) Admitted and had NG tubed placed.    Bowel function resumed, NG removed, follow up x-rays showed no recurrence of obstruction.  Patient able to tolerate PO intake without vomiting.     Patient did have some opioid induced constipation afterwards, resolved with resumption of normal bowel regimen plus SMOG enema.  COPD (chronic obstructive pulmonary disease) (HCC) No evidence of flare             Condition at discharge: good  Exam Physical Exam Constitutional:      Appearance: She is well-developed. She is obese. She  is not ill-appearing or toxic-appearing.  Cardiovascular:     Rate and Rhythm: Normal rate and regular rhythm.     Heart sounds: No murmur heard.   No gallop.  Pulmonary:     Effort: Pulmonary effort is normal.     Breath sounds: Normal breath sounds. No wheezing or rales.  Abdominal:     General: Bowel sounds are normal. There is no distension.     Palpations: Abdomen is soft.     Tenderness: There is no abdominal tenderness.     Hernia: No hernia is present.  Skin:    General: Skin is warm and dry.  Neurological:     General: No focal deficit present.     Mental Status: She is alert and oriented to person, place, and time.  Psychiatric:        Mood and Affect: Mood normal.        Behavior: Behavior normal.      Disposition: Home  Discharge time: greater than 30 minutes.  Follow-up Information     Harvie Junior, MD Follow up in 1 week(s).   Specialty: Family Medicine Contact information: [address] [place] [address] [phone]                 Allergies as of 09/16/2021       Reactions   Morphine And Related Other (See Comments), Hives, Itching   Per her doctor Per her doctor   Acetaminophen Other (  See Comments)   Liver enlargement- hep C   Ibuprofen    Per her doctor   Nsaids    Per her doctor   Prednisone Other (See Comments)   Per her doctor   Statins Other (See Comments)   Affected liver enzymes Affected liver enzymes        Medication List     STOP taking these medications    Fluticasone-Salmeterol 250-50 MCG/DOSE Aepb Commonly known as: ADVAIR   Sofosbuvir-Velpatasvir 400-100 MG Tabs Commonly known as: Epclusa       TAKE these medications    albuterol 108 (90 Base) MCG/ACT inhaler Commonly known as: VENTOLIN HFA Inhale 2 puffs into the lungs every 6 (six) hours as needed for wheezing or shortness of breath.   albuterol 1.25 MG/3ML nebulizer solution Commonly known as: ACCUNEB Take 3 mLs by nebulization 3  (three) times daily as needed for wheezing or shortness of breath.   aspirin 81 MG chewable tablet Chew 81 mg by mouth daily.   budesonide-formoterol 160-4.5 MCG/ACT inhaler Commonly known as: SYMBICORT Inhale 2 puffs into the lungs 2 (two) times daily.   clopidogrel 75 MG tablet Commonly known as: PLAVIX Take 75 mg by mouth daily.   diazepam 5 MG tablet Commonly known as: VALIUM Take 5 mg by mouth 3 (three) times daily as needed.   diclofenac 25 MG EC tablet Commonly known as: VOLTAREN Take 25 mg by mouth daily as needed for moderate pain or mild pain.   dicyclomine 10 MG capsule Commonly known as: BENTYL Take 1 capsule (10 mg total) by mouth 2 (two) times daily.   fenofibrate 160 MG tablet Take 160 mg by mouth daily.   fluticasone 50 MCG/ACT nasal spray Commonly known as: FLONASE Place 2 sprays into both nostrils daily as needed for allergies or rhinitis.   Fluticasone-Umeclidin-Vilant 100-62.5-25 MCG/ACT Aepb Take 1 puff by mouth daily.   furosemide 40 MG tablet Commonly known as: LASIX Take 0.5 tablets (20 mg total) by mouth daily.   lubiprostone 24 MCG capsule Commonly known as: AMITIZA Take 24 mcg by mouth 2 (two) times daily with a meal.   naloxone 4 MG/0.1ML Liqd nasal spray kit Commonly known as: NARCAN Place 1 spray into the nose once. INTO 1 NOSTRIL AND MAY REPEAT EVERY 2-3 MINUTES UNTIL EMERGENCY ARRIVES   Olmesartan-amLODIPine-HCTZ 20-5-12.5 MG Tabs Take 1 tablet by mouth daily.   oxyCODONE 15 MG immediate release tablet Commonly known as: ROXICODONE Take 15 mg by mouth 4 (four) times daily as needed for pain.   pantoprazole 40 MG tablet Commonly known as: PROTONIX Take 1 tablet (40 mg total) by mouth 2 (two) times daily. For 4 weeks and then decrease to daily   pregabalin 200 MG capsule Commonly known as: LYRICA Take 200 mg by mouth 3 (three) times daily.   SUMAtriptan 100 MG tablet Commonly known as: IMITREX Take 1 tablet by mouth 2 (two)  times daily as needed. Take 1 tablet by mouth twice daily, at least 2 hours between doses as needed        DG Abdomen 1 View  Result Date: 09/10/2021 CLINICAL DATA:  Post NGT insertion. EXAM: ABDOMEN - 1 VIEW COMPARISON:  CT abdomen/pelvis 09/10/2021. FINDINGS: A problem-oriented radiograph of the upper abdomen was performed to assess enteric tube placement. An enteric tube passes below the level of the left hemidiaphragm with tip projecting in the expected location of the gastric antrum/pylorus. Findings of small-bowel obstruction were better appreciated on the CT abdomen/pelvis performed  earlier today. The imaged lung bases are clear. IMPRESSION: An enteric tube passes below level of left hemidiaphragm with tip projecting in the expected location of the gastric antrum/pylorus. Electronically Signed   By: Kellie Simmering D.O.   On: 09/10/2021 12:45   CT ABDOMEN PELVIS W CONTRAST  Result Date: 09/10/2021 CLINICAL DATA:  Chronic abdominal pain.  Worsening pain. EXAM: CT ABDOMEN AND PELVIS WITH CONTRAST TECHNIQUE: Multidetector CT imaging of the abdomen and pelvis was performed using the standard protocol following bolus administration of intravenous contrast. CONTRAST:  15mL OMNIPAQUE IOHEXOL 300 MG/ML  SOLN COMPARISON:  CT 04/06/2017 FINDINGS: Lower chest: Lung bases are clear. Hepatobiliary: No focal hepatic lesion. No biliary duct dilatation. Common bile duct is mildly dilated following cholecystectomy. Pancreas: Pancreas is normal. No ductal dilatation. No pancreatic inflammation. Spleen: Normal spleen Adrenals/urinary tract: Adrenal glands normal. Kidneys, ureters and bladder normal. Stomach/Bowel: Oral contrast within the stomach, duodenum and proximal small bowel. No contrast in the distal small bowel. There is evidence of stasis of enteric contents in the mid small bowel. Small bowel is dilated to 3.4 cm (image 43/2). A potential transition point identified on coronal image 80/series 5 along the  ventral peritoneal surface just LEFT of midline (image 47/2). No pneumatosis or portal venous gas.  No bowel perforation. Terminal ileum is normal.  Colon is relatively decompressed. There is a small bowel small bowel anastomosis in the RIGHT lower quadrant (image 60/2) indicating prior abdominal surgery. Vascular/Lymphatic: Abdominal aorta is normal caliber with atherosclerotic calcification. There is no retroperitoneal or periportal lymphadenopathy. No pelvic lymphadenopathy. Reproductive: Post hysterectomy.  Adnexa unremarkable Other: No free fluid. Musculoskeletal: No aggressive osseous lesion. IMPRESSION: 1. Small bowel obstruction pattern in the mid small bowel. Transition point along the ventral peritoneal wall LEFT of midline. Suspect adhesions related to prior surgery. 2. No pneumatosis or portal venous gas.  No perforation. Findings conveyed to Drema Dallas 09/10/2021  at11:32. Electronically Signed   By: Suzy Bouchard M.D.   On: 09/10/2021 11:33   DG Abd Portable 1V  Result Date: 09/15/2021 CLINICAL DATA:  Evaluate for constipation. EXAM: PORTABLE ABDOMEN - 1 VIEW COMPARISON:  Abdominal radiograph dated 09/11/2021. FINDINGS: No significant colonic stool burden. There is no bowel dilatation or evidence of obstruction. No free air or radiopaque calculi. Right upper quadrant cholecystectomy clips. The osseous structures are intact the soft tissues are unremarkable. IMPRESSION: Negative. Electronically Signed   By: Anner Crete M.D.   On: 09/15/2021 19:54   DG Abd Portable 1V-Small Bowel Obstruction Protocol-initial, 8 hr delay  Result Date: 09/11/2021 CLINICAL DATA:  A 65 year old female presents with small bowel obstruction postcontrast administration on the floor for assessment. Is EXAM: PORTABLE ABDOMEN - 1 VIEW COMPARISON:  Comparison made with CT examination of September 10, 2021. FINDINGS: Nasogastric tube terminates in the stomach. Side port below the expected location of the GE  junction. Lung bases with basilar atelectasis. Mildly dilated to moderately dilated small bowel loops in the LEFT and lower abdomen. Passage of contrast into the colon and passing essentially throughout the entire colon on the current study. On limited assessment there is no acute skeletal process. IMPRESSION: Nasogastric tube terminates in the stomach. Mildly dilated to moderately dilated small bowel loops in the LEFT and lower abdomen, but with passage of contrast into the colon. Basilar atelectasis. Electronically Signed   By: Zetta Bills M.D.   On: 09/11/2021 08:03   Results for orders placed or performed during the hospital encounter of 09/10/21  Resp  Panel by RT-PCR (Flu A&B, Covid) Nasopharyngeal Swab     Status: None   Collection Time: 09/10/21 11:55 AM   Specimen: Nasopharyngeal Swab; Nasopharyngeal(NP) swabs in vial transport medium  Result Value Ref Range Status   SARS Coronavirus 2 by RT PCR NEGATIVE NEGATIVE Final    Comment: (NOTE) SARS-CoV-2 target nucleic acids are NOT DETECTED.  The SARS-CoV-2 RNA is generally detectable in upper respiratory specimens during the acute phase of infection. The lowest concentration of SARS-CoV-2 viral copies this assay can detect is 138 copies/mL. A negative result does not preclude SARS-Cov-2 infection and should not be used as the sole basis for treatment or other patient management decisions. A negative result may occur with  improper specimen collection/handling, submission of specimen other than nasopharyngeal swab, presence of viral mutation(s) within the areas targeted by this assay, and inadequate number of viral copies(<138 copies/mL). A negative result must be combined with clinical observations, patient history, and epidemiological information. The expected result is Negative.  Fact Sheet for Patients:  EntrepreneurPulse.com.au  Fact Sheet for Healthcare Providers:   IncredibleEmployment.be  This test is no t yet approved or cleared by the [place] FDA and  has been authorized for detection and/or diagnosis of SARS-CoV-2 by FDA under an Emergency Use Authorization (EUA). This EUA will remain  in effect (meaning this test can be used) for the duration of the COVID-19 declaration under Section 564(b)(1) of the Act, 21 U.S.C.section 360bbb-3(b)(1), unless the authorization is terminated  or revoked sooner.       Influenza A by PCR NEGATIVE NEGATIVE Final   Influenza B by PCR NEGATIVE NEGATIVE Final    Comment: (NOTE) The Xpert Xpress SARS-CoV-2/FLU/RSV plus assay is intended as an aid in the diagnosis of influenza from Nasopharyngeal swab specimens and should not be used as a sole basis for treatment. Nasal washings and aspirates are unacceptable for Xpert Xpress SARS-CoV-2/FLU/RSV testing.  Fact Sheet for Patients: EntrepreneurPulse.com.au  Fact Sheet for Healthcare Providers: IncredibleEmployment.be  This test is not yet approved or cleared by the [place] FDA and has been authorized for detection and/or diagnosis of SARS-CoV-2 by FDA under an Emergency Use Authorization (EUA). This EUA will remain in effect (meaning this test can be used) for the duration of the COVID-19 declaration under Section 564(b)(1) of the Act, 21 U.S.C. section 360bbb-3(b)(1), unless the authorization is terminated or revoked.  Performed at [facility], [address] [place], [place] [address]     Signed:  Edwin Dada MD.  [facility] 09/16/2021, 1:12 PM

## 2021-09-16 NOTE — Assessment & Plan Note (Signed)
No evidence of flare 

## 2021-09-16 NOTE — Progress Notes (Signed)
Pt tolerated SMOG enema well. Had small BM post administration.   Prior to discharge, pharmacy notified for returning pts medications . Will pick up meds and give to pt.

## 2021-09-16 NOTE — Progress Notes (Signed)
Pts home medications returned to her.

## 2021-09-16 NOTE — Progress Notes (Signed)
BM yesterday and abd xray this am without obstruction or significant colonic stool burden. Tolerating diet. She is stable for discharge from general surgery perspective. We will sign off but please do not hesitate to contact us with any questions or concerns

## 2021-09-16 NOTE — Assessment & Plan Note (Signed)
Admitted and had NG tubed placed.    Bowel function resumed, NG removed, follow up x-rays showed no recurrence of obstruction.  Patient able to tolerate PO intake without vomiting.     Patient did have some opioid induced constipation afterwards, resolved with resumption of normal bowel regimen plus SMOG enema.

## 2021-09-16 NOTE — Plan of Care (Signed)

## 2021-09-21 ENCOUNTER — Telehealth: Payer: Self-pay | Admitting: Gastroenterology

## 2021-09-21 NOTE — Telephone Encounter (Signed)
Inbound call from patient states she was discharged from [facility] 10/27 after they gave her an edema. Once she was home, states she started experiencing blood for 4 days without a bowel movement. Currently is still having blood from her rectrum

## 2021-09-21 NOTE — Telephone Encounter (Signed)
Pt states that she is experiencing a small amount of blood from rectum for the past four days although it is getting less as the days progress.  Pt states that she feels it is from where they placed the enema.  Pt states that she has not have had a BM in 4 days. Pt states that she has only been eating clears and a soft diet. Pt was encouraged to eat a soft diet and start taking  miraLax. Pt verbalized understanding with all questions answered.  Pt requesting " a light needs to go down her throat"  Please advise if pt needs an EGD

## 2021-09-27 NOTE — Telephone Encounter (Signed)
Her recent colonoscopy was negative except for hemorrhoids She likely had hemorrhoidal bleeding after enema For EGD, hold off for now, until OV. She does not need OV with me or an app clinic  RG

## 2021-09-27 NOTE — Telephone Encounter (Signed)
Please clarify on note below in regard to OV

## 2021-09-30 NOTE — Telephone Encounter (Signed)
Typo error  She does need OV with me or app clinic  Rg

## 2021-09-30 NOTE — Telephone Encounter (Signed)
Unable to reach pt.  Voice mail box not been set up; unable to leave message

## 2021-10-01 NOTE — Telephone Encounter (Signed)
Pt made aware of Dr. Lyndel Safe recommendations  Appointment was made to see Alonza Bogus PA 10/07/2021 @ 1:30. Pt made aware. Address Provided Pt verbalized understanding with all questions answered

## 2021-10-07 ENCOUNTER — Ambulatory Visit: Payer: Medicare Other | Admitting: Gastroenterology

## 2021-10-22 ENCOUNTER — Ambulatory Visit: Payer: Medicare Other | Admitting: Gastroenterology

## 2021-11-02 ENCOUNTER — Encounter (HOSPITAL_COMMUNITY): Payer: Self-pay

## 2021-11-02 ENCOUNTER — Inpatient Hospital Stay (HOSPITAL_COMMUNITY)
Admission: EM | Admit: 2021-11-02 | Discharge: 2021-11-08 | DRG: 392 | Disposition: A | Discharge status: Home / Self Care | Payer: Medicare Other | Attending: Internal Medicine | Admitting: Internal Medicine

## 2021-11-02 ENCOUNTER — Emergency Department (HOSPITAL_COMMUNITY): Payer: Medicare Other

## 2021-11-02 DIAGNOSIS — I110 Hypertensive heart disease with heart failure: Secondary | ICD-10-CM | POA: Diagnosis present

## 2021-11-02 DIAGNOSIS — Z885 Allergy status to narcotic agent status: Secondary | ICD-10-CM | POA: Diagnosis not present

## 2021-11-02 DIAGNOSIS — Z888 Allergy status to other drugs, medicaments and biological substances status: Secondary | ICD-10-CM

## 2021-11-02 DIAGNOSIS — Z79899 Other long term (current) drug therapy: Secondary | ICD-10-CM

## 2021-11-02 DIAGNOSIS — Z0189 Encounter for other specified special examinations: Secondary | ICD-10-CM

## 2021-11-02 DIAGNOSIS — B1920 Unspecified viral hepatitis C without hepatic coma: Secondary | ICD-10-CM | POA: Diagnosis present

## 2021-11-02 DIAGNOSIS — F419 Anxiety disorder, unspecified: Secondary | ICD-10-CM | POA: Diagnosis present

## 2021-11-02 DIAGNOSIS — K56609 Unspecified intestinal obstruction, unspecified as to partial versus complete obstruction: Secondary | ICD-10-CM

## 2021-11-02 DIAGNOSIS — K529 Noninfective gastroenteritis and colitis, unspecified: Principal | ICD-10-CM | POA: Diagnosis present

## 2021-11-02 DIAGNOSIS — F17210 Nicotine dependence, cigarettes, uncomplicated: Secondary | ICD-10-CM | POA: Diagnosis present

## 2021-11-02 DIAGNOSIS — G894 Chronic pain syndrome: Secondary | ICD-10-CM | POA: Diagnosis present

## 2021-11-02 DIAGNOSIS — J411 Mucopurulent chronic bronchitis: Secondary | ICD-10-CM | POA: Diagnosis not present

## 2021-11-02 DIAGNOSIS — E079 Disorder of thyroid, unspecified: Secondary | ICD-10-CM | POA: Diagnosis present

## 2021-11-02 DIAGNOSIS — I5032 Chronic diastolic (congestive) heart failure: Secondary | ICD-10-CM | POA: Diagnosis present

## 2021-11-02 DIAGNOSIS — Z7902 Long term (current) use of antithrombotics/antiplatelets: Secondary | ICD-10-CM | POA: Diagnosis not present

## 2021-11-02 DIAGNOSIS — Z7951 Long term (current) use of inhaled steroids: Secondary | ICD-10-CM

## 2021-11-02 DIAGNOSIS — Z7982 Long term (current) use of aspirin: Secondary | ICD-10-CM

## 2021-11-02 DIAGNOSIS — E785 Hyperlipidemia, unspecified: Secondary | ICD-10-CM | POA: Diagnosis present

## 2021-11-02 DIAGNOSIS — Z20822 Contact with and (suspected) exposure to covid-19: Secondary | ICD-10-CM | POA: Diagnosis present

## 2021-11-02 DIAGNOSIS — Z886 Allergy status to analgesic agent status: Secondary | ICD-10-CM | POA: Diagnosis not present

## 2021-11-02 DIAGNOSIS — J449 Chronic obstructive pulmonary disease, unspecified: Secondary | ICD-10-CM | POA: Diagnosis present

## 2021-11-02 DIAGNOSIS — G4730 Sleep apnea, unspecified: Secondary | ICD-10-CM | POA: Diagnosis present

## 2021-11-02 DIAGNOSIS — K219 Gastro-esophageal reflux disease without esophagitis: Secondary | ICD-10-CM | POA: Diagnosis present

## 2021-11-02 DIAGNOSIS — R1084 Generalized abdominal pain: Secondary | ICD-10-CM | POA: Diagnosis not present

## 2021-11-02 DIAGNOSIS — Z8673 Personal history of transient ischemic attack (TIA), and cerebral infarction without residual deficits: Secondary | ICD-10-CM

## 2021-11-02 DIAGNOSIS — Z96611 Presence of right artificial shoulder joint: Secondary | ICD-10-CM | POA: Diagnosis present

## 2021-11-02 DIAGNOSIS — J418 Mixed simple and mucopurulent chronic bronchitis: Secondary | ICD-10-CM | POA: Diagnosis not present

## 2021-11-02 DIAGNOSIS — I10 Essential (primary) hypertension: Secondary | ICD-10-CM | POA: Diagnosis present

## 2021-11-02 LAB — CBC WITH DIFFERENTIAL/PLATELET
Abs Immature Granulocytes: 0.03 10*3/uL (ref 0.00–0.07)
Basophils Absolute: 0 10*3/uL (ref 0.0–0.1)
Basophils Relative: 0 %
Eosinophils Absolute: 0 10*3/uL (ref 0.0–0.5)
Eosinophils Relative: 0 %
HCT: 42.5 % (ref 36.0–46.0)
Hemoglobin: 13.9 g/dL (ref 12.0–15.0)
Immature Granulocytes: 0 %
Lymphocytes Relative: 17 %
Lymphs Abs: 1.6 10*3/uL (ref 0.7–4.0)
MCH: 23.8 pg — ABNORMAL LOW (ref 26.0–34.0)
MCHC: 32.7 g/dL (ref 30.0–36.0)
MCV: 72.6 fL — ABNORMAL LOW (ref 80.0–100.0)
Monocytes Absolute: 0.6 10*3/uL (ref 0.1–1.0)
Monocytes Relative: 6 %
Neutro Abs: 7.6 10*3/uL (ref 1.7–7.7)
Neutrophils Relative %: 77 %
Platelets: 284 10*3/uL (ref 150–400)
RBC: 5.85 MIL/uL — ABNORMAL HIGH (ref 3.87–5.11)
RDW: 15.4 % (ref 11.5–15.5)
WBC: 9.8 10*3/uL (ref 4.0–10.5)
nRBC: 0 % (ref 0.0–0.2)

## 2021-11-02 LAB — COMPREHENSIVE METABOLIC PANEL
ALT: 19 U/L (ref 0–44)
AST: 16 U/L (ref 15–41)
Albumin: 4.1 g/dL (ref 3.5–5.0)
Alkaline Phosphatase: 84 U/L (ref 38–126)
Anion gap: 8 (ref 5–15)
BUN: 13 mg/dL (ref 8–23)
CO2: 28 mmol/L (ref 22–32)
Calcium: 9.1 mg/dL (ref 8.9–10.3)
Chloride: 105 mmol/L (ref 98–111)
Creatinine, Ser: 0.62 mg/dL (ref 0.44–1.00)
GFR, Estimated: >60 mL/min (ref >60)
Glucose, Bld: 111 mg/dL — ABNORMAL HIGH (ref 70–99)
Potassium: 3.7 mmol/L (ref 3.5–5.1)
Sodium: 141 mmol/L (ref 135–145)
Total Bilirubin: 0.6 mg/dL (ref 0.3–1.2)
Total Protein: 7.7 g/dL (ref 6.5–8.1)

## 2021-11-02 LAB — RESP PANEL BY RT-PCR (FLU A&B, COVID) ARPGX2
Influenza A by PCR: NEGATIVE
Influenza B by PCR: NEGATIVE
SARS Coronavirus 2 by RT PCR: NEGATIVE

## 2021-11-02 LAB — LACTIC ACID, PLASMA: Lactic Acid, Venous: 1.7 mmol/L (ref 0.5–1.9)

## 2021-11-02 MED ORDER — HYDROMORPHONE HCL 1 MG/ML IJ SOLN
1.0000 mg | Freq: Once | INTRAMUSCULAR | Status: AC
  Administered 2021-11-02: 1 mg via INTRAVENOUS
  Filled 2021-11-02: qty 1

## 2021-11-02 MED ORDER — FENTANYL CITRATE PF 50 MCG/ML IJ SOSY
50.0000 ug | PREFILLED_SYRINGE | INTRAMUSCULAR | Status: DC | PRN
  Administered 2021-11-02: 50 ug via INTRAVENOUS
  Filled 2021-11-02: qty 1

## 2021-11-02 MED ORDER — SODIUM CHLORIDE 0.9 % IV BOLUS
1000.0000 mL | Freq: Once | INTRAVENOUS | Status: AC
  Administered 2021-11-02: 1000 mL via INTRAVENOUS

## 2021-11-02 MED ORDER — SODIUM CHLORIDE 0.9 % IV SOLN
25.0000 mg | Freq: Four times a day (QID) | INTRAVENOUS | Status: DC | PRN
  Administered 2021-11-02 – 2021-11-08 (×10): 25 mg via INTRAVENOUS
  Filled 2021-11-02: qty 25
  Filled 2021-11-02 (×2): qty 1
  Filled 2021-11-02 (×7): qty 25
  Filled 2021-11-02: qty 1
  Filled 2021-11-02 (×2): qty 25
  Filled 2021-11-02: qty 1

## 2021-11-02 MED ORDER — LACTATED RINGERS IV SOLN: INTRAVENOUS | Status: AC

## 2021-11-02 MED ORDER — PANTOPRAZOLE SODIUM 40 MG IV SOLR
40.0000 mg | Freq: Two times a day (BID) | INTRAVENOUS | Status: DC
  Administered 2021-11-02 – 2021-11-08 (×12): 40 mg via INTRAVENOUS
  Filled 2021-11-02 (×13): qty 40

## 2021-11-02 MED ORDER — ONDANSETRON HCL 4 MG/2ML IJ SOLN
4.0000 mg | Freq: Once | INTRAMUSCULAR | Status: AC
  Administered 2021-11-02: 4 mg via INTRAVENOUS
  Filled 2021-11-02: qty 2

## 2021-11-02 MED ORDER — HYDROMORPHONE HCL 1 MG/ML IJ SOLN
1.0000 mg | INTRAMUSCULAR | Status: DC | PRN
Start: 2021-11-02 — End: 2021-11-03
  Administered 2021-11-02 – 2021-11-03 (×4): 1 mg via INTRAVENOUS
  Filled 2021-11-02 (×4): qty 1

## 2021-11-02 NOTE — ED Provider Notes (Signed)
[facility] DEPT Provider Note   CSN: [id number] Arrival date & time: 11/02/21  1811     History Chief Complaint  Patient presents with   Abdominal Pain   Emesis    Caitlyn Pacheco is a 65 y.o. female.  HPI    65 year old female past medical history for anxiety, chronic diastolic heart failure, COPD, GERD, hepatitis C, CVA and multiple abdominal surgeries and bouts of SBO comes in with chief complaint of abdominal pain, nausea and vomiting.  Patient reports that her current symptoms started this morning.  She has been having generalized abdominal pain with nausea and vomiting.  She had several episodes of emesis, bilious and nonbloody.  Last normal BM was 3 days ago.  Past Medical History:  Diagnosis Date   Anxiety    Blood transfusion without reported diagnosis    CHF (congestive heart failure) (HCC)    Chronic pain syndrome    COPD (chronic obstructive pulmonary disease) (HCC)    GERD (gastroesophageal reflux disease)    HCV (hepatitis C virus)    Hyperlipidemia    Hypertension    Neuromuscular disorder ([place])    nerve damage due to stroke.   Osteoarthritis    Osteoporosis    Sleep apnea    Doesn't use CPAP   Stroke [facility]) 1996   one stroke and two TIA's   Thyroid disease     Patient Active Problem List   Diagnosis Date Noted   Hypothyroidism (acquired) 09/15/2021   History of transient ischemic attack (TIA) 09/15/2021   Smoking 09/15/2021   SBO (small bowel obstruction) ([place]) 09/10/2021   COPD (chronic obstructive pulmonary disease) ([place])    GERD (gastroesophageal reflux disease)    Hyponatremia    Hepatitis C 08/14/2018   History of difficult venous access 08/14/2018   Chronic depression 04/08/2017   Hypokalemia 04/08/2017   HTN (hypertension) 04/06/2017   Chronic pain syndrome 04/06/2017    Past Surgical History:  Procedure Laterality Date   ABDOMINAL HYSTERECTOMY     ABDOMINAL SURGERY     CHOLECYSTECTOMY      COLONOSCOPY  12/26/2014   Very minimal sigmoid diverticulosis. Small internal hemorrhoids.    ESOPHAGOGASTRODUODENOSCOPY  01/21/2011   Normal EGD.    THYROID SURGERY     TOTAL SHOULDER REPLACEMENT Right 09/01/2018     OB History   No obstetric history on file.     Family History  Problem Relation Age of Onset   Hepatitis C Sister    Rectal cancer Paternal Grandfather    Colon cancer Maternal Aunt    Rectal cancer Maternal Aunt    Esophageal cancer Neg Hx    Stomach cancer Neg Hx     Social History   Tobacco Use   Smoking status: Every Day    Packs/day: 0.50    Types: Cigarettes   Smokeless tobacco: Never   Tobacco comments:    pt rpeorts using about 6 per day  Vaping Use   Vaping Use: Never used  Substance Use Topics   Alcohol use: No   Drug use: No    Home Medications Prior to Admission medications   Medication Sig Start Date End Date Taking? Authorizing Provider  albuterol (ACCUNEB) 1.25 MG/3ML nebulizer solution Take 3 mLs by nebulization 3 (three) times daily as needed for wheezing or shortness of breath.  10/31/11   [provider]  albuterol (VENTOLIN HFA) 108 (90 Base) MCG/ACT inhaler Inhale 2 puffs into the lungs every 6 (six) hours as  needed for wheezing or shortness of breath.    [provider]  aspirin 81 MG chewable tablet Chew 81 mg by mouth daily. 08/13/12   [provider]  budesonide-formoterol (SYMBICORT) 160-4.5 MCG/ACT inhaler Inhale 2 puffs into the lungs 2 (two) times daily.    [provider]  clopidogrel (PLAVIX) 75 MG tablet Take 75 mg by mouth daily.    [provider]  diazepam (VALIUM) 5 MG tablet Take 5 mg by mouth 3 (three) times daily as needed. 08/10/21   [provider]  diclofenac (VOLTAREN) 25 MG EC tablet Take 25 mg by mouth daily as needed for moderate pain or mild pain.    [provider]  dicyclomine (BENTYL) 10 MG capsule Take 1 capsule (10 mg total) by mouth 2 (two)  times daily. 08/20/21   Jackquline Denmark, MD  fenofibrate 160 MG tablet Take 160 mg by mouth daily. 07/22/21   [provider]  fluticasone (FLONASE) 50 MCG/ACT nasal spray Place 2 sprays into both nostrils daily as needed for allergies or rhinitis.    [provider]  Fluticasone-Umeclidin-Vilant 100-62.5-25 MCG/ACT AEPB Take 1 puff by mouth daily. 08/17/21   [provider]  furosemide (LASIX) 40 MG tablet Take 0.5 tablets (20 mg total) by mouth daily. 04/08/17   Dhungel, Flonnie Overman, MD  lubiprostone (AMITIZA) 24 MCG capsule Take 24 mcg by mouth 2 (two) times daily with a meal.    [provider]  naloxone (NARCAN) nasal spray 4 mg/0.1 mL Place 1 spray into the nose once. INTO 1 NOSTRIL AND MAY REPEAT EVERY 2-3 MINUTES UNTIL EMERGENCY ARRIVES 02/18/17   [provider]  Olmesartan-amLODIPine-HCTZ 20-5-12.5 MG TABS Take 1 tablet by mouth daily.    [provider]  oxyCODONE (ROXICODONE) 15 MG immediate release tablet Take 15 mg by mouth 4 (four) times daily as needed for pain.    [provider]  pantoprazole (PROTONIX) 40 MG tablet Take 1 tablet (40 mg total) by mouth 2 (two) times daily. For 4 weeks and then decrease to daily 08/20/21   Jackquline Denmark, MD  pregabalin (LYRICA) 200 MG capsule Take 200 mg by mouth 3 (three) times daily.     [provider]  SUMAtriptan (IMITREX) 100 MG tablet Take 1 tablet by mouth 2 (two) times daily as needed. Take 1 tablet by mouth twice daily, at least 2 hours between doses as needed 07/12/21   [provider]    Allergies    Morphine and related, Acetaminophen, Ibuprofen, Nsaids, Prednisone, and Statins  Review of Systems   Review of Systems  Constitutional:  Positive for activity change.  Gastrointestinal:  Positive for abdominal distention, abdominal pain, nausea and vomiting.  All other systems reviewed and are negative.  Physical Exam Updated Vital Signs BP 138/77    Pulse 60    Temp  98.7 F (37.1 C)    Resp 18    SpO2 94%   Physical Exam Vitals and nursing note reviewed.  Constitutional:      Appearance: She is well-developed.  HENT:     Head: Atraumatic.  Cardiovascular:     Rate and Rhythm: Normal rate.  Pulmonary:     Effort: Pulmonary effort is normal.  Abdominal:     Tenderness: There is abdominal tenderness.  Musculoskeletal:     Cervical back: Normal range of motion and neck supple.  Skin:    General: Skin is warm and dry.  Neurological:     Mental Status: She  is alert and oriented to person, place, and time.    ED Results / Procedures / Treatments   Labs (all labs ordered are listed, but only abnormal results are displayed) Labs Reviewed  CBC WITH DIFFERENTIAL/PLATELET - Abnormal; Notable for the following components:      Result Value   RBC 5.85 (*)    MCV 72.6 (*)    MCH 23.8 (*)    All other components within normal limits  COMPREHENSIVE METABOLIC PANEL - Abnormal; Notable for the following components:   Glucose, Bld 111 (*)    All other components within normal limits  RESP PANEL BY RT-PCR (FLU A&B, COVID) ARPGX2  LACTIC ACID, PLASMA    EKG None  Radiology DG ABD ACUTE 2+V W 1V CHEST  Result Date: 11/02/2021 CLINICAL DATA:  Increasing abdominal pain. Small-bowel obstruction 2 months ago. EXAM: DG ABDOMEN ACUTE WITH 1 VIEW CHEST COMPARISON:  Abdomen series 09/15/2021, CT abdomen and pelvis with IV contrast 09/10/2021 FINDINGS: Within normal limits retained stool burden. There is mild dilatation of a few left mid abdominal small bowel segments up to 3.5 cm. No other small bowel dilatation is seen. There are no findings of acute pneumoperitoneum. No radiopaque calculi are identified. There are right upper quadrant surgical clips. Visceral shadows are stable. Heart size and mediastinal contours are within normal limits. Both lungs are clear. There are bilateral shoulder arthroplasties and a right IJ port catheter with tip in the mid SVC.  IMPRESSION: 1. A few mildly dilated left mid abdominal small bowel segments, could indicate evidence of a regional ileus or low-grade small bowel obstruction, with colonic gas seen through into the sigmoid segment. 2. No other radiographic evidence of acute process. Incidental findings as above. Electronically Signed   By: Telford Nab M.D.   On: 11/02/2021 21:09    Procedures Procedures   Medications Ordered in ED Medications  promethazine (PHENERGAN) 25 mg in sodium chloride 0.9 % 50 mL IVPB (0 mg Intravenous Stopped 11/02/21 2143)  sodium chloride 0.9 % bolus 1,000 mL (0 mLs Intravenous Stopped 11/02/21 2143)  ondansetron (ZOFRAN) injection 4 mg (4 mg Intravenous Given 11/02/21 1930)  HYDROmorphone (DILAUDID) injection 1 mg (1 mg Intravenous Given 11/02/21 2045)    ED Course  I have reviewed the triage vital signs and the nursing notes.  Pertinent labs & imaging results that were available during my care of the patient were reviewed by me and considered in my medical decision making (see chart for details).  Clinical Course as of 11/02/21 2219  Tue Nov 02, 2021  2209 DG ABD ACUTE 2+V W 1V CHEST Acute abdominal series reviewed independently.  There are multiple air-fluid levels.  With a high pretest probability for SBO, it appears that patient likely is having early SBO.  Labs are otherwise reassuring.  I do not think CT is going to add a significant value.  I discussed the case with Dr. Georgette Dover, general surgery.  He is comfortable with patient being admitted to medicine.  They will round on the patient tomorrow. [AN]    Clinical Course User Index [AN] Varney Biles, MD   MDM Rules/Calculators/A&P                          65 year old comes in with chief complaint of abdominal pain, nausea and vomiting.  She is having bilious emesis.  She has history of multiple abdominal surgeries and bouts of small bowel obstruction.  Symptoms started this morning,  suspicion is high for SBO  versus ileus.  Patient's abdomen is soft.  Basic labs ordered.  Hydration initiated.  Pain control also initiated.  Pretest probability for SBO is high.  I will order an acute abdominal series and reassess.   Final Clinical Impression(s) / ED Diagnoses Final diagnoses:  SBO (small bowel obstruction) ([place])    Rx / DC Orders ED Discharge Orders     None        Varney Biles, MD 11/02/21 2220

## 2021-11-02 NOTE — H&P (Signed)
History and Physical    Caitlyn Pacheco PFX:[id number] DOB: 13-Sep-1956 DOA: 11/02/2021  PCP: Harvie Junior, MD  Patient coming from: Home via EMS  I have personally briefly reviewed patient's old medical records in [place]  Chief Complaint: Abdominal pain, nausea, vomiting  HPI: Caitlyn Pacheco is a 65 y.o. female with medical history significant for COPD, recurrent small bowel obstruction, history of CVA, HTN, HLD, hepatitis C s/p Epclusa, chronic pain syndrome, anxiety who presented to the ED for evaluation of abdominal pain with nausea and vomiting.  Patient with history of recurrent small bowel obstruction, last admitted in October 2022 for the same.  She reports developing acute severe upper abdominal pain around 2 AM 12/13.  Her pain felt similar to her prior small bowel obstruction episodes.  She tried to manage at home by taking Bentyl, Amitiza, Protonix, and her home pain medication without significant relief.  She subsequently developed nausea with bilious nonbloody emesis.  She has not been able to maintain any adequate oral intake since onset of symptoms.  She states she did have a small formed bowel movement around 2 PM.  She is not passing any flatus.  Symptoms were so severe and persistent that she came to the ED for further evaluation management.  ED Course:  Initial vitals showed BP 140/112, pulse 80, RR 22, temp 98.7 F, SPO2 100% on room air.  Labs show WBC 9.8, hemoglobin 13.9, platelets 284,000, sodium 141, potassium 3.7, bicarb 28, BUN 13, creatinine 0.62, serum glucose 111, LFTs within normal limits, lactic acid 1.7.  Respiratory panel in process.  Acute abdomen x-ray shows few mildly dilated left mid abdominal small bowel segments which could indicate regional ileus or low-grade small bowel obstruction.  No findings of acute pneumoperitoneum seen.  Patient was given 1 L normal saline, IV Dilaudid 1 mg, IV Zofran, Phenergan.  EDP discussed with on-call  general surgery, Dr. Georgette Dover, who recommended medical admission and they will consult in AM.  NG tube ordered to be placed.  The hospitalist service was consulted to admit for further evaluation and management.  Review of Systems: All systems reviewed and are negative except as documented in history of present illness above.   Past Medical History:  Diagnosis Date   Anxiety    Blood transfusion without reported diagnosis    CHF (congestive heart failure) (HCC)    Chronic pain syndrome    COPD (chronic obstructive pulmonary disease) (HCC)    GERD (gastroesophageal reflux disease)    HCV (hepatitis C virus)    Hyperlipidemia    Hypertension    Neuromuscular disorder ([place])    nerve damage due to stroke.   Osteoarthritis    Osteoporosis    Sleep apnea    Doesn't use CPAP   Stroke [facility]) 1996   one stroke and two TIA's   Thyroid disease     Past Surgical History:  Procedure Laterality Date   ABDOMINAL HYSTERECTOMY     ABDOMINAL SURGERY     CHOLECYSTECTOMY     COLONOSCOPY  12/26/2014   Very minimal sigmoid diverticulosis. Small internal hemorrhoids.    ESOPHAGOGASTRODUODENOSCOPY  01/21/2011   Normal EGD.    THYROID SURGERY     TOTAL SHOULDER REPLACEMENT Right 09/01/2018    Social History:  reports that she has been smoking cigarettes. She has been smoking an average of .5 packs per day. She has never used smokeless tobacco. She reports that she does not drink alcohol and does not use  drugs.  Allergies  Allergen Reactions   Morphine And Related Other (See Comments), Hives and Itching    Per her doctor Per her doctor   Acetaminophen Other (See Comments)    Liver enlargement- hep C   Ibuprofen     Per her doctor   Nsaids     Per her doctor   Prednisone Other (See Comments)    Per her doctor   Statins Other (See Comments)    Affected liver enzymes Affected liver enzymes    Family History  Problem Relation Age of Onset   Hepatitis C Sister    Rectal cancer Paternal  Grandfather    Colon cancer Maternal Aunt    Rectal cancer Maternal Aunt    Esophageal cancer Neg Hx    Stomach cancer Neg Hx      Prior to Admission medications   Medication Sig Start Date End Date Taking? Authorizing Provider  albuterol (ACCUNEB) 1.25 MG/3ML nebulizer solution Take 3 mLs by nebulization 3 (three) times daily as needed for wheezing or shortness of breath.  10/31/11   [provider]  albuterol (VENTOLIN HFA) 108 (90 Base) MCG/ACT inhaler Inhale 2 puffs into the lungs every 6 (six) hours as needed for wheezing or shortness of breath.    [provider]  aspirin 81 MG chewable tablet Chew 81 mg by mouth daily. 08/13/12   [provider]  budesonide-formoterol (SYMBICORT) 160-4.5 MCG/ACT inhaler Inhale 2 puffs into the lungs 2 (two) times daily.    [provider]  clopidogrel (PLAVIX) 75 MG tablet Take 75 mg by mouth daily.    [provider]  diazepam (VALIUM) 5 MG tablet Take 5 mg by mouth 3 (three) times daily as needed. 08/10/21   [provider]  diclofenac (VOLTAREN) 25 MG EC tablet Take 25 mg by mouth daily as needed for moderate pain or mild pain.    [provider]  dicyclomine (BENTYL) 10 MG capsule Take 1 capsule (10 mg total) by mouth 2 (two) times daily. 08/20/21   Jackquline Denmark, MD  fenofibrate 160 MG tablet Take 160 mg by mouth daily. 07/22/21   [provider]  fluticasone (FLONASE) 50 MCG/ACT nasal spray Place 2 sprays into both nostrils daily as needed for allergies or rhinitis.    [provider]  Fluticasone-Umeclidin-Vilant 100-62.5-25 MCG/ACT AEPB Take 1 puff by mouth daily. 08/17/21   [provider]  furosemide (LASIX) 40 MG tablet Take 0.5 tablets (20 mg total) by mouth daily. 04/08/17   Dhungel, Flonnie Overman, MD  lubiprostone (AMITIZA) 24 MCG capsule Take 24 mcg by mouth 2 (two) times daily with a meal.    [provider]  naloxone (NARCAN) nasal spray 4 mg/0.1 mL  Place 1 spray into the nose once. INTO 1 NOSTRIL AND MAY REPEAT EVERY 2-3 MINUTES UNTIL EMERGENCY ARRIVES 02/18/17   [provider]  Olmesartan-amLODIPine-HCTZ 20-5-12.5 MG TABS Take 1 tablet by mouth daily.    [provider]  oxyCODONE (ROXICODONE) 15 MG immediate release tablet Take 15 mg by mouth 4 (four) times daily as needed for pain.    [provider]  pantoprazole (PROTONIX) 40 MG tablet Take 1 tablet (40 mg total) by mouth 2 (two) times daily. For 4 weeks and then decrease to daily 08/20/21   Jackquline Denmark, MD  pregabalin (LYRICA) 200 MG capsule Take 200 mg by mouth 3 (three) times daily.     [provider]  SUMAtriptan (IMITREX) 100 MG tablet Take 1 tablet  by mouth 2 (two) times daily as needed. Take 1 tablet by mouth twice daily, at least 2 hours between doses as needed 07/12/21   [provider]    Physical Exam: Vitals:   11/02/21 2000 11/02/21 2030 11/02/21 2100 11/02/21 2130  BP: (!) 147/75 136/79 (!) 142/81 138/77  Pulse: 60 69 61 60  Resp: 14 20 16 18   Temp:      SpO2: 99% 94% 98% 94%   Constitutional: Resting supine in bed, NAD, calm, comfortable Eyes: PERRL, lids and conjunctivae normal ENMT: Mucous membranes are dry. Posterior pharynx clear of any exudate or lesions.Normal dentition.  Neck: normal, supple, no masses. Respiratory: clear to auscultation bilaterally, no wheezing, no crackles. Normal respiratory effort. No accessory muscle use.  Cardiovascular: Regular rate and rhythm, no murmurs / rubs / gallops. No extremity edema. 2+ pedal pulses.  Port-A-Cath in place right upper chest. Abdomen: Upper abdominal tenderness, no masses palpated. No hepatosplenomegaly. Bowel sounds diminished.  Musculoskeletal: no clubbing / cyanosis. No joint deformity upper and lower extremities. Good ROM, no contractures. Normal muscle tone.  Skin: Well-healed abdominal surgical scars.  No rashes, lesions, ulcers. No induration Neurologic: CN  2-12 grossly intact. Sensation intact. Strength 5/5 in all 4.  Psychiatric: Normal judgment and insight. Alert and oriented x 3. Normal mood.   Labs on Admission: I have personally reviewed following labs and imaging studies  CBC: Recent Labs  Lab 11/02/21 1927  WBC 9.8  NEUTROABS 7.6  HGB 13.9  HCT 42.5  MCV 72.6*  PLT 517   Basic Metabolic Panel: Recent Labs  Lab 11/02/21 1927  NA 141  K 3.7  CL 105  CO2 28  GLUCOSE 111*  BUN 13  CREATININE 0.62  CALCIUM 9.1   GFR: CrCl cannot be calculated (Unknown ideal weight.). Liver Function Tests: Recent Labs  Lab 11/02/21 1927  AST 16  ALT 19  ALKPHOS 84  BILITOT 0.6  PROT 7.7  ALBUMIN 4.1   No results for input(s): LIPASE, AMYLASE in the last 168 hours. No results for input(s): AMMONIA in the last 168 hours. Coagulation Profile: No results for input(s): INR, PROTIME in the last 168 hours. Cardiac Enzymes: No results for input(s): CKTOTAL, CKMB, CKMBINDEX, TROPONINI in the last 168 hours. BNP (last 3 results) No results for input(s): PROBNP in the last 8760 hours. HbA1C: No results for input(s): HGBA1C in the last 72 hours. CBG: No results for input(s): GLUCAP in the last 168 hours. Lipid Profile: No results for input(s): CHOL, HDL, LDLCALC, TRIG, CHOLHDL, LDLDIRECT in the last 72 hours. Thyroid Function Tests: No results for input(s): TSH, T4TOTAL, FREET4, T3FREE, THYROIDAB in the last 72 hours. Anemia Panel: No results for input(s): VITAMINB12, FOLATE, FERRITIN, TIBC, IRON, RETICCTPCT in the last 72 hours. Urine analysis: No results found for: COLORURINE, APPEARANCEUR, [place], [place], GLUCOSEU, HGBUR, BILIRUBINUR, KETONESUR, PROTEINUR, UROBILINOGEN, NITRITE, LEUKOCYTESUR  Radiological Exams on Admission: DG ABD ACUTE 2+V W 1V CHEST  Result Date: 11/02/2021 CLINICAL DATA:  Increasing abdominal pain. Small-bowel obstruction 2 months ago. EXAM: DG ABDOMEN ACUTE WITH 1 VIEW CHEST COMPARISON:  Abdomen series  09/15/2021, CT abdomen and pelvis with IV contrast 09/10/2021 FINDINGS: Within normal limits retained stool burden. There is mild dilatation of a few left mid abdominal small bowel segments up to 3.5 cm. No other small bowel dilatation is seen. There are no findings of acute pneumoperitoneum. No radiopaque calculi are identified. There are right upper quadrant surgical clips. Visceral shadows are stable. Heart size and mediastinal  contours are within normal limits. Both lungs are clear. There are bilateral shoulder arthroplasties and a right IJ port catheter with tip in the mid SVC. IMPRESSION: 1. A few mildly dilated left mid abdominal small bowel segments, could indicate evidence of a regional ileus or low-grade small bowel obstruction, with colonic gas seen through into the sigmoid segment. 2. No other radiographic evidence of acute process. Incidental findings as above. Electronically Signed   By: Telford Nab M.D.   On: 11/02/2021 21:09    EKG: Not performed.  Assessment/Plan Principal Problem:   Small bowel obstruction (HCC) Active Problems:   HTN (hypertension)   Chronic pain syndrome   COPD (chronic obstructive pulmonary disease) (HCC)   History of transient ischemic attack (TIA)   Caitlyn Pacheco is a 65 y.o. female with medical history significant for COPD, recurrent small bowel obstruction, history of CVA, HTN, HLD, hepatitis C s/p Epclusa, chronic pain syndrome, anxiety who is admitted with recurrent small bowel obstruction.  Small bowel obstruction: NG tube tempted to be placed in the ED but unsuccessful.  Nausea/vomiting is improving. -General surgery consulted, to see in a.m. -Keep n.p.o. -Continue IV fluid hydration overnight -Continue analgesics and antiemetics as needed -Repeat KUB in a.m.  COPD: Stable.  Continue Trelegy and albuterol as needed.  Hypertension: Oral antihypertensives on hold while NPO.  Chronic pain syndrome/anxiety: Home oxycodone, Lyrica, Bentyl on  hold while NPO.  On IV Dilaudid as needed.  Using IV diazepam 5 mg TID prn anxiety while NPO.  History of CVA: Resume home aspirin, Plavix when able.  GERD: Use IV Protonix 40 mg twice daily while NPO.  DVT prophylaxis: Subcutaneous heparin Code Status: Full code, confirmed on admission Family Communication: Discussed with patient, she has discussed with family Disposition Plan: From home and likely discharged home pending clinical progress Consults called: General surgery Level of care: Med-Surg Admission status:  Status is: Inpatient  Remains inpatient appropriate because: Admitted with recurrent small bowel obstruction requiring n.p.o. status, continued IV fluid hydration, pain control, antiemetics, and general surgery evaluation.  Zada Finders MD [facility]  If 7PM-7AM, please contact night-coverage [email or web address]  11/02/2021, 10:28 PM

## 2021-11-02 NOTE — ED Notes (Signed)
ED TO INPATIENT HANDOFF REPORT  ED Nurse Name and Phone #: [phone] Erick Colace, RN   S Name/Age/Gender Caitlyn Pacheco 65 y.o. female Room/Bed: WA17/WA17  Code Status   Code Status: Prior  Home/SNF/Other Home Patient oriented to: self, place, time, and situation Is this baseline? Yes   Triage Complete: Triage complete  Chief Complaint Small bowel obstruction ([place]) [H84.696]  Triage Note Pt arrived via EMS, from home, c/o worsening abd pain and vomiting, hx of SBO. Just recently hospitalized for same.    Allergies Allergies  Allergen Reactions   Morphine And Related Other (See Comments), Hives and Itching    Per her doctor Per her doctor   Acetaminophen Other (See Comments)    Liver enlargement- hep C   Ibuprofen     Per her doctor   Nsaids     Per her doctor   Prednisone Other (See Comments)    Per her doctor   Statins Other (See Comments)    Affected liver enzymes Affected liver enzymes    Level of Care/Admitting Diagnosis ED Disposition     ED Disposition  Admit   Condition  --   Combine: [place] [[id number]]  Level of Care: Med-Surg [16]  May admit patient to [facility] or [facility] if equivalent level of care is available:: No  Covid Evaluation: Asymptomatic Screening Protocol (No Symptoms)  Diagnosis: Small bowel obstruction [facility]) [[id number]]  Admitting Physician: Lenore Cordia [[id number]]  Attending Physician: Lenore Cordia [phone]  Estimated length of stay: past midnight tomorrow  Certification:: I certify this patient will need inpatient services for at least 2 midnights          B Medical/Surgery History Past Medical History:  Diagnosis Date   Anxiety    Blood transfusion without reported diagnosis    CHF (congestive heart failure) (HCC)    Chronic pain syndrome    COPD (chronic obstructive pulmonary disease) (HCC)    GERD (gastroesophageal reflux disease)    HCV (hepatitis C virus)     Hyperlipidemia    Hypertension    Neuromuscular disorder ([place])    nerve damage due to stroke.   Osteoarthritis    Osteoporosis    Sleep apnea    Doesn't use CPAP   Stroke [facility]) 1996   one stroke and two TIA's   Thyroid disease    Past Surgical History:  Procedure Laterality Date   ABDOMINAL HYSTERECTOMY     ABDOMINAL SURGERY     CHOLECYSTECTOMY     COLONOSCOPY  12/26/2014   Very minimal sigmoid diverticulosis. Small internal hemorrhoids.    ESOPHAGOGASTRODUODENOSCOPY  01/21/2011   Normal EGD.    THYROID SURGERY     TOTAL SHOULDER REPLACEMENT Right 09/01/2018     A IV Location/Drains/Wounds Patient Lines/Drains/Airways Status     Active Line/Drains/Airways     Name Placement date Placement time Site Days   Implanted Port Right Chest --  --  Chest  --            Intake/Output Last 24 hours  Intake/Output Summary (Last 24 hours) at 11/02/2021 2247 Last data filed at 11/02/2021 2143 Gross per 24 hour  Intake 1051.73 ml  Output --  Net 1051.73 ml    Labs/Imaging Results for orders placed or performed during the hospital encounter of 11/02/21 (from the past 48 hour(s))  CBC with Differential     Status: Abnormal   Collection Time: 11/02/21  7:27 PM  Result Value  Ref Range   WBC 9.8 4.0 - 10.5 K/uL   RBC 5.85 (H) 3.87 - 5.11 MIL/uL   Hemoglobin 13.9 12.0 - 15.0 g/dL   HCT 42.5 36.0 - 46.0 %   MCV 72.6 (L) 80.0 - 100.0 fL   MCH 23.8 (L) 26.0 - 34.0 pg   MCHC 32.7 30.0 - 36.0 g/dL   RDW 15.4 11.5 - 15.5 %   Platelets 284 150 - 400 K/uL   nRBC 0.0 0.0 - 0.2 %   Neutrophils Relative % 77 %   Neutro Abs 7.6 1.7 - 7.7 K/uL   Lymphocytes Relative 17 %   Lymphs Abs 1.6 0.7 - 4.0 K/uL   Monocytes Relative 6 %   Monocytes Absolute 0.6 0.1 - 1.0 K/uL   Eosinophils Relative 0 %   Eosinophils Absolute 0.0 0.0 - 0.5 K/uL   Basophils Relative 0 %   Basophils Absolute 0.0 0.0 - 0.1 K/uL   Immature Granulocytes 0 %   Abs Immature Granulocytes 0.03 0.00 - 0.07 K/uL     Comment: Performed at [facility], [place] [address], [place], [place] [address]  Comprehensive metabolic panel     Status: Abnormal   Collection Time: 11/02/21  7:27 PM  Result Value Ref Range   Sodium 141 135 - 145 mmol/L   Potassium 3.7 3.5 - 5.1 mmol/L   Chloride 105 98 - 111 mmol/L   CO2 28 22 - 32 mmol/L   Glucose, Bld 111 (H) 70 - 99 mg/dL    Comment: Glucose reference range applies only to samples taken after fasting for at least 8 hours.   BUN 13 8 - 23 mg/dL   Creatinine, Ser 0.62 0.44 - 1.00 mg/dL   Calcium 9.1 8.9 - 10.3 mg/dL   Total Protein 7.7 6.5 - 8.1 g/dL   Albumin 4.1 3.5 - 5.0 g/dL   AST 16 15 - 41 U/L   ALT 19 0 - 44 U/L   Alkaline Phosphatase 84 38 - 126 U/L   Total Bilirubin 0.6 0.3 - 1.2 mg/dL   GFR, Estimated >60 >60 mL/min    Comment: (NOTE) Calculated using the CKD-EPI Creatinine Equation (2021)    Anion gap 8 5 - 15    Comment: Performed at [facility], [place] [address], [place], [place] [address]  Lactic acid, plasma     Status: None   Collection Time: 11/02/21  7:28 PM  Result Value Ref Range   Lactic Acid, Venous 1.7 0.5 - 1.9 mmol/L    Comment: Performed at [facility], [place] [address] [place], [place] [address]  Resp Panel by RT-PCR (Flu A&B, Covid) Nasopharyngeal Swab     Status: None   Collection Time: 11/02/21  9:56 PM   Specimen: Nasopharyngeal Swab; Nasopharyngeal(NP) swabs in vial transport medium  Result Value Ref Range   SARS Coronavirus 2 by RT PCR NEGATIVE NEGATIVE    Comment: (NOTE) SARS-CoV-2 target nucleic acids are NOT DETECTED.  The SARS-CoV-2 RNA is generally detectable in upper respiratory specimens during the acute phase of infection. The lowest concentration of SARS-CoV-2 viral copies this assay can detect is 138 copies/mL. A negative result does not preclude SARS-Cov-2 infection and should not be used as the sole basis for treatment or other patient management  decisions. A negative result may occur with  improper specimen collection/handling, submission of specimen other than nasopharyngeal swab, presence of viral mutation(s) within the areas targeted by this assay, and inadequate number of  viral copies(<138 copies/mL). A negative result must be combined with clinical observations, patient history, and epidemiological information. The expected result is Negative.  Fact Sheet for Patients:  EntrepreneurPulse.com.au  Fact Sheet for Healthcare Providers:  IncredibleEmployment.be  This test is no t yet approved or cleared by the [place] FDA and  has been authorized for detection and/or diagnosis of SARS-CoV-2 by FDA under an Emergency Use Authorization (EUA). This EUA will remain  in effect (meaning this test can be used) for the duration of the COVID-19 declaration under Section 564(b)(1) of the Act, 21 U.S.C.section 360bbb-3(b)(1), unless the authorization is terminated  or revoked sooner.       Influenza A by PCR NEGATIVE NEGATIVE   Influenza B by PCR NEGATIVE NEGATIVE    Comment: (NOTE) The Xpert Xpress SARS-CoV-2/FLU/RSV plus assay is intended as an aid in the diagnosis of influenza from Nasopharyngeal swab specimens and should not be used as a sole basis for treatment. Nasal washings and aspirates are unacceptable for Xpert Xpress SARS-CoV-2/FLU/RSV testing.  Fact Sheet for Patients: EntrepreneurPulse.com.au  Fact Sheet for Healthcare Providers: IncredibleEmployment.be  This test is not yet approved or cleared by the [place] FDA and has been authorized for detection and/or diagnosis of SARS-CoV-2 by FDA under an Emergency Use Authorization (EUA). This EUA will remain in effect (meaning this test can be used) for the duration of the COVID-19 declaration under Section 564(b)(1) of the Act, 21 U.S.C. section 360bbb-3(b)(1), unless the authorization  is terminated or revoked.  Performed at [facility], [place] [address], [place], [place] [address]    DG ABD ACUTE 2+V W 1V CHEST  Result Date: 11/02/2021 CLINICAL DATA:  Increasing abdominal pain. Small-bowel obstruction 2 months ago. EXAM: DG ABDOMEN ACUTE WITH 1 VIEW CHEST COMPARISON:  Abdomen series 09/15/2021, CT abdomen and pelvis with IV contrast 09/10/2021 FINDINGS: Within normal limits retained stool burden. There is mild dilatation of a few left mid abdominal small bowel segments up to 3.5 cm. No other small bowel dilatation is seen. There are no findings of acute pneumoperitoneum. No radiopaque calculi are identified. There are right upper quadrant surgical clips. Visceral shadows are stable. Heart size and mediastinal contours are within normal limits. Both lungs are clear. There are bilateral shoulder arthroplasties and a right IJ port catheter with tip in the mid SVC. IMPRESSION: 1. A few mildly dilated left mid abdominal small bowel segments, could indicate evidence of a regional ileus or low-grade small bowel obstruction, with colonic gas seen through into the sigmoid segment. 2. No other radiographic evidence of acute process. Incidental findings as above. Electronically Signed   By: Telford Nab M.D.   On: 11/02/2021 21:09    Pending Labs Unresulted Labs (From admission, onward)    None       Vitals/Pain Today's Vitals   11/02/21 2030 11/02/21 2100 11/02/21 2110 11/02/21 2130  BP: 136/79 (!) 142/81  138/77  Pulse: 69 61  60  Resp: 20 16  18   Temp:      SpO2: 94% 98%  94%  PainSc:   6      Isolation Precautions No active isolations  Medications Medications  promethazine (PHENERGAN) 25 mg in sodium chloride 0.9 % 50 mL IVPB (0 mg Intravenous Stopped 11/02/21 2143)  sodium chloride 0.9 % bolus 1,000 mL (0 mLs Intravenous Stopped 11/02/21 2143)  ondansetron (ZOFRAN) injection 4 mg (4 mg Intravenous Given 11/02/21 1930)  HYDROmorphone (DILAUDID)  injection 1 mg (1 mg Intravenous Given 11/02/21  2045)    Mobility walks Low fall risk   Focused Assessments    R Recommendations: See Admitting Provider Note  Report given to:   Additional Notes:

## 2021-11-02 NOTE — ED Notes (Addendum)
NG tube placement x 2 attempts unsuccessful. Blockage and resistance met with both nostrils and pt refused to proceed with procedure. Pt states this has happened in the past and states she feels cannot do it without being sedated. Posey Pronto, MD made aware.

## 2021-11-02 NOTE — ED Triage Notes (Signed)
Pt arrived via EMS, from home, c/o worsening abd pain and vomiting, hx of SBO. Just recently hospitalized for same.

## 2021-11-03 ENCOUNTER — Inpatient Hospital Stay (HOSPITAL_COMMUNITY): Payer: Medicare Other

## 2021-11-03 DIAGNOSIS — Z0189 Encounter for other specified special examinations: Secondary | ICD-10-CM

## 2021-11-03 DIAGNOSIS — J411 Mucopurulent chronic bronchitis: Secondary | ICD-10-CM

## 2021-11-03 LAB — BASIC METABOLIC PANEL
Anion gap: 8 (ref 5–15)
BUN: 10 mg/dL (ref 8–23)
CO2: 28 mmol/L (ref 22–32)
Calcium: 8.5 mg/dL — ABNORMAL LOW (ref 8.9–10.3)
Chloride: 103 mmol/L (ref 98–111)
Creatinine, Ser: 0.65 mg/dL (ref 0.44–1.00)
GFR, Estimated: >60 mL/min (ref >60)
Glucose, Bld: 102 mg/dL — ABNORMAL HIGH (ref 70–99)
Potassium: 3.4 mmol/L — ABNORMAL LOW (ref 3.5–5.1)
Sodium: 139 mmol/L (ref 135–145)

## 2021-11-03 LAB — CBC
HCT: 39.6 % (ref 36.0–46.0)
Hemoglobin: 12.7 g/dL (ref 12.0–15.0)
MCH: 24 pg — ABNORMAL LOW (ref 26.0–34.0)
MCHC: 32.1 g/dL (ref 30.0–36.0)
MCV: 74.9 fL — ABNORMAL LOW (ref 80.0–100.0)
Platelets: 263 10*3/uL (ref 150–400)
RBC: 5.29 MIL/uL — ABNORMAL HIGH (ref 3.87–5.11)
RDW: 15.3 % (ref 11.5–15.5)
WBC: 7.5 10*3/uL (ref 4.0–10.5)
nRBC: 0 % (ref 0.0–0.2)

## 2021-11-03 MED ORDER — POTASSIUM CHLORIDE 10 MEQ/100ML IV SOLN
10.0000 meq | INTRAVENOUS | Status: AC
  Administered 2021-11-03 (×4): 10 meq via INTRAVENOUS
  Filled 2021-11-03 (×4): qty 100

## 2021-11-03 MED ORDER — SODIUM CHLORIDE 0.9% FLUSH
10.0000 mL | Freq: Two times a day (BID) | INTRAVENOUS | Status: DC
  Administered 2021-11-03: 22:00:00 10 mL

## 2021-11-03 MED ORDER — HYDROMORPHONE HCL 1 MG/ML IJ SOLN
1.0000 mg | INTRAMUSCULAR | Status: DC | PRN
  Administered 2021-11-03 – 2021-11-07 (×29): 1 mg via INTRAVENOUS
  Filled 2021-11-03 (×29): qty 1

## 2021-11-03 MED ORDER — HEPARIN SODIUM (PORCINE) 5000 UNIT/ML IJ SOLN
5000.0000 [IU] | Freq: Three times a day (TID) | INTRAMUSCULAR | Status: DC
  Administered 2021-11-03 – 2021-11-08 (×16): 5000 [IU] via SUBCUTANEOUS
  Filled 2021-11-03 (×16): qty 1

## 2021-11-03 MED ORDER — UMECLIDINIUM BROMIDE 62.5 MCG/ACT IN AEPB
1.0000 | INHALATION_SPRAY | Freq: Every day | RESPIRATORY_TRACT | Status: DC
  Administered 2021-11-03 – 2021-11-08 (×5): 1 via RESPIRATORY_TRACT
  Filled 2021-11-03: qty 7

## 2021-11-03 MED ORDER — DIAZEPAM 5 MG/ML IJ SOLN
5.0000 mg | Freq: Three times a day (TID) | INTRAMUSCULAR | Status: DC | PRN
  Administered 2021-11-06 (×2): 5 mg via INTRAVENOUS
  Filled 2021-11-03 (×2): qty 2

## 2021-11-03 MED ORDER — FLUTICASONE-UMECLIDIN-VILANT 100-62.5-25 MCG/ACT IN AEPB: 1.0000 | INHALATION_SPRAY | Freq: Every day | RESPIRATORY_TRACT | Status: DC

## 2021-11-03 MED ORDER — SODIUM CHLORIDE 0.9% FLUSH: 10.0000 mL | INTRAVENOUS | Status: DC | PRN

## 2021-11-03 MED ORDER — FLUTICASONE FUROATE-VILANTEROL 100-25 MCG/ACT IN AEPB
1.0000 | INHALATION_SPRAY | Freq: Every day | RESPIRATORY_TRACT | Status: DC
  Administered 2021-11-03 – 2021-11-08 (×5): 1 via RESPIRATORY_TRACT
  Filled 2021-11-03: qty 28

## 2021-11-03 MED ORDER — ALBUTEROL SULFATE (2.5 MG/3ML) 0.083% IN NEBU: 3.0000 mL | INHALATION_SOLUTION | Freq: Three times a day (TID) | RESPIRATORY_TRACT | Status: DC | PRN

## 2021-11-03 MED ORDER — ALBUTEROL SULFATE HFA 108 (90 BASE) MCG/ACT IN AERS: 2.0000 | INHALATION_SPRAY | Freq: Four times a day (QID) | RESPIRATORY_TRACT | Status: DC | PRN

## 2021-11-03 MED ORDER — CHLORHEXIDINE GLUCONATE CLOTH 2 % EX PADS
6.0000 | MEDICATED_PAD | Freq: Every day | CUTANEOUS | Status: DC
  Administered 2021-11-03 – 2021-11-08 (×6): 6 via TOPICAL

## 2021-11-03 NOTE — Progress Notes (Signed)
PROGRESS NOTE    Caitlyn Pacheco  CXK:[id number] DOB: 1956/06/24 DOA: 11/02/2021 PCP: Harvie Junior, MD    Brief Narrative:  65 y.o. female with medical history significant for COPD, recurrent small bowel obstruction, history of CVA, HTN, HLD, hepatitis C s/p Epclusa, chronic pain syndrome, anxiety who is admitted with recurrent small bowel obstruction.  Assessment & Plan:   Principal Problem:   Small bowel obstruction (HCC) Active Problems:   HTN (hypertension)   Chronic pain syndrome   COPD (chronic obstructive pulmonary disease) (HCC)   History of transient ischemic attack (TIA)  Small bowel obstruction: NG tube tempted to be placed in the ED but unsuccessful, later placed successfully on the floor per Surgery -General Surgery following -Cont NPO status -Per Surgery, no need for emergent surgery   COPD: Stable.  Continue Trelegy and albuterol as needed. No auditory wheezing this AM   Hypertension: Oral antihypertensives on hold while NPO.   Chronic pain syndrome/anxiety: Home oxycodone, Lyrica, Bentyl on hold while NPO.  On IV Dilaudid as needed.  Using IV diazepam 5 mg TID prn anxiety while NPO.   History of CVA: Resume home aspirin, Plavix when able and when pt is able to reliably tolerate PO   GERD: Use IV Protonix 40 mg twice daily while NPO.   DVT prophylaxis: Heparin subq Code Status: Full Family Communication: Pt in room, family not at bedside  Status is: Inpatient  Remains inpatient appropriate because: Acuity of illness, needing NPO status for active SBO    Consultants:  General Surgery  Procedures:    Antimicrobials: Anti-infectives (From admission, onward)    None       Subjective: Complains of continued abd pain, only briefly improved with current dilaudid dosing  Objective: Vitals:   11/02/21 2325 11/03/21 0507 11/03/21 0816 11/03/21 0955  BP: (!) 144/78 118/68  (!) 110/59  Pulse: (!) 55 (!) 57  63  Resp: 16 16  18   Temp:  99 F (37.2 C) 98.6 F (37 C)  98.4 F (36.9 C)  TempSrc: Oral Oral  Oral  SpO2: 100% 100% 98% 100%    Intake/Output Summary (Last 24 hours) at 11/03/2021 1417 Last data filed at 11/03/2021 0600 Gross per 24 hour  Intake 1832.3 ml  Output --  Net 1832.3 ml   There were no vitals filed for this visit.  Examination: General exam: Awake, laying in bed, in nad Respiratory system: Normal respiratory effort, no wheezing Cardiovascular system: regular rate, s1, s2 Gastrointestinal system: Distended, decreased BS Central nervous system: CN2-12 grossly intact, strength intact Extremities: Perfused, no clubbing Skin: Normal skin turgor, no notable skin lesions seen Psychiatry: Mood normal // no visual hallucinations   Data Reviewed: I have personally reviewed following labs and imaging studies  CBC: Recent Labs  Lab 11/02/21 1927 11/03/21 0525  WBC 9.8 7.5  NEUTROABS 7.6  --   HGB 13.9 12.7  HCT 42.5 39.6  MCV 72.6* 74.9*  PLT 284 970   Basic Metabolic Panel: Recent Labs  Lab 11/02/21 1927 11/03/21 0525  NA 141 139  K 3.7 3.4*  CL 105 103  CO2 28 28  GLUCOSE 111* 102*  BUN 13 10  CREATININE 0.62 0.65  CALCIUM 9.1 8.5*   GFR: CrCl cannot be calculated (Unknown ideal weight.). Liver Function Tests: Recent Labs  Lab 11/02/21 1927  AST 16  ALT 19  ALKPHOS 84  BILITOT 0.6  PROT 7.7  ALBUMIN 4.1   No results for input(s): LIPASE, AMYLASE in  the last 168 hours. No results for input(s): AMMONIA in the last 168 hours. Coagulation Profile: No results for input(s): INR, PROTIME in the last 168 hours. Cardiac Enzymes: No results for input(s): CKTOTAL, CKMB, CKMBINDEX, TROPONINI in the last 168 hours. BNP (last 3 results) No results for input(s): PROBNP in the last 8760 hours. HbA1C: No results for input(s): HGBA1C in the last 72 hours. CBG: No results for input(s): GLUCAP in the last 168 hours. Lipid Profile: No results for input(s): CHOL, HDL, LDLCALC,  TRIG, CHOLHDL, LDLDIRECT in the last 72 hours. Thyroid Function Tests: No results for input(s): TSH, T4TOTAL, FREET4, T3FREE, THYROIDAB in the last 72 hours. Anemia Panel: No results for input(s): VITAMINB12, FOLATE, FERRITIN, TIBC, IRON, RETICCTPCT in the last 72 hours. Sepsis Labs: Recent Labs  Lab 11/02/21 1928  LATICACIDVEN 1.7    Recent Results (from the past 240 hour(s))  Resp Panel by RT-PCR (Flu A&B, Covid) Nasopharyngeal Swab     Status: None   Collection Time: 11/02/21  9:56 PM   Specimen: Nasopharyngeal Swab; Nasopharyngeal(NP) swabs in vial transport medium  Result Value Ref Range Status   SARS Coronavirus 2 by RT PCR NEGATIVE NEGATIVE Final    Comment: (NOTE) SARS-CoV-2 target nucleic acids are NOT DETECTED.  The SARS-CoV-2 RNA is generally detectable in upper respiratory specimens during the acute phase of infection. The lowest concentration of SARS-CoV-2 viral copies this assay can detect is 138 copies/mL. A negative result does not preclude SARS-Cov-2 infection and should not be used as the sole basis for treatment or other patient management decisions. A negative result may occur with  improper specimen collection/handling, submission of specimen other than nasopharyngeal swab, presence of viral mutation(s) within the areas targeted by this assay, and inadequate number of viral copies(<138 copies/mL). A negative result must be combined with clinical observations, patient history, and epidemiological information. The expected result is Negative.  Fact Sheet for Patients:  EntrepreneurPulse.com.au  Fact Sheet for Healthcare Providers:  IncredibleEmployment.be  This test is no t yet approved or cleared by the [place] FDA and  has been authorized for detection and/or diagnosis of SARS-CoV-2 by FDA under an Emergency Use Authorization (EUA). This EUA will remain  in effect (meaning this test can be used) for the duration  of the COVID-19 declaration under Section 564(b)(1) of the Act, 21 U.S.C.section 360bbb-3(b)(1), unless the authorization is terminated  or revoked sooner.       Influenza A by PCR NEGATIVE NEGATIVE Final   Influenza B by PCR NEGATIVE NEGATIVE Final    Comment: (NOTE) The Xpert Xpress SARS-CoV-2/FLU/RSV plus assay is intended as an aid in the diagnosis of influenza from Nasopharyngeal swab specimens and should not be used as a sole basis for treatment. Nasal washings and aspirates are unacceptable for Xpert Xpress SARS-CoV-2/FLU/RSV testing.  Fact Sheet for Patients: EntrepreneurPulse.com.au  Fact Sheet for Healthcare Providers: IncredibleEmployment.be  This test is not yet approved or cleared by the [place] FDA and has been authorized for detection and/or diagnosis of SARS-CoV-2 by FDA under an Emergency Use Authorization (EUA). This EUA will remain in effect (meaning this test can be used) for the duration of the COVID-19 declaration under Section 564(b)(1) of the Act, 21 U.S.C. section 360bbb-3(b)(1), unless the authorization is terminated or revoked.  Performed at [facility], [place] [address] [place], [place] [address]      Radiology Studies: Abd 1 View (KUB)  Result Date: 11/03/2021 CLINICAL DATA:  Small bowel obstruction. EXAM: ABDOMEN -  1 VIEW COMPARISON:  11/02/2021 FINDINGS: Scattered gas and an ordinary amount of stool are present in the colon. There is a paucity of small bowel gas. No dilated loops of bowel are seen to clearly indicate obstruction. Right upper quadrant abdominal surgical clips are noted. No acute osseous abnormality is seen. IMPRESSION: No evidence of bowel obstruction. Electronically Signed   By: Logan Bores M.D.   On: 11/03/2021 08:21   DG ABD ACUTE 2+V W 1V CHEST  Result Date: 11/02/2021 CLINICAL DATA:  Increasing abdominal pain. Small-bowel obstruction 2 months ago. EXAM: DG  ABDOMEN ACUTE WITH 1 VIEW CHEST COMPARISON:  Abdomen series 09/15/2021, CT abdomen and pelvis with IV contrast 09/10/2021 FINDINGS: Within normal limits retained stool burden. There is mild dilatation of a few left mid abdominal small bowel segments up to 3.5 cm. No other small bowel dilatation is seen. There are no findings of acute pneumoperitoneum. No radiopaque calculi are identified. There are right upper quadrant surgical clips. Visceral shadows are stable. Heart size and mediastinal contours are within normal limits. Both lungs are clear. There are bilateral shoulder arthroplasties and a right IJ port catheter with tip in the mid SVC. IMPRESSION: 1. A few mildly dilated left mid abdominal small bowel segments, could indicate evidence of a regional ileus or low-grade small bowel obstruction, with colonic gas seen through into the sigmoid segment. 2. No other radiographic evidence of acute process. Incidental findings as above. Electronically Signed   By: Telford Nab M.D.   On: 11/02/2021 21:09   DG Abd Portable 1V  Result Date: 11/03/2021 CLINICAL DATA:  NG tube placement. EXAM: PORTABLE ABDOMEN - 1 VIEW COMPARISON:  Earlier film, same date. FINDINGS: The NG tube tip is in the antropyloric region of the stomach. Unremarkable bowel gas pattern. IMPRESSION: NG tube tip is in the antropyloric region of the stomach. Electronically Signed   By: Marijo Sanes M.D.   On: 11/03/2021 13:35    Scheduled Meds:  Chlorhexidine Gluconate Cloth  6 each Topical Daily   umeclidinium bromide  1 puff Inhalation Daily   And   fluticasone furoate-vilanterol  1 puff Inhalation Daily   heparin  5,000 Units Subcutaneous Q8H   pantoprazole (PROTONIX) IV  40 mg Intravenous Q12H   sodium chloride flush  10-40 mL Intracatheter Q12H   Continuous Infusions:  potassium chloride 10 mEq (11/03/21 1249)   promethazine (PHENERGAN) injection (IM or IVPB) 25 mg (11/03/21 0912)     LOS: 1 day   Marylu Lund, MD [facility] Pager On Amion  If 7PM-7AM, please contact night-coverage 11/03/2021, 2:17 PM

## 2021-11-03 NOTE — Progress Notes (Signed)
Transition of Care [facility]) Screening Note  Patient Details  Name: Caitlyn Pacheco Date of Birth: 05-26-1956  Transition of Care [facility]) CM/SW Contact:    Sherie Don, LCSW Phone Number: 11/03/2021, 11:05 AM  Transition of Care Department [facility]) has reviewed patient and no TOC needs have been identified at this time. We will continue to monitor patient advancement through interdisciplinary progression rounds. If new patient transition needs arise, please place a TOC consult.

## 2021-11-03 NOTE — Progress Notes (Signed)
°   11/03/21 1100  Mobility  Activity Refused mobility   Pt IND per RN.   [facility] Acute Rehab Services Office: [phone]

## 2021-11-03 NOTE — Consult Note (Signed)
[name] 1956-02-17  [id number].    Requesting MD: Dr. Marylu Lund Chief Complaint/Reason for Consult: SBO  HPI: Caitlyn Pacheco is a 65 y.o. 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    Patient reports on Sunday, 12/11, she began having generalized abdominal pain that was mild and dull in nature with associated nausea.  She went to her primary care on Monday where she received Toradol and Phenergan with some relief.  She was able to have a hot dog for dinner Tuesday night.  She reports at 2 AM on 12/13 she awoke with abdominal bloating/distension and moderate to severe pain from her umbilicus to her epigastrium. Her pain worsened despite taking Bentyl, Amitiza, Protonix, and her home pain medication. She notes associated nausea and >10 episodes of emesis with the last just earlier this morning. She states she did have a very small formed bowel movement around 2 PM yesterday. She has not had a normal bm before that for ~4 days.  She is not passing any flatus. She feels her symptoms were similar to prior sbo so she presented to the ED for evaluation. Patient last admission for SBO was 08/2021 that resolved without operative intervention. She has been following with GI and was supposed to follow up with GI on 10/07/21 but missed the appointment.   Since admission, workup significant for lactic 1.7, wbc 9.8>7.5, Cr wnl, abd xray with a few mildly dilated left mid abdominal small bowel segments, with colonic gas seen through into the sigmoid segment. No CT obtained. Admitted to medicine. NGT was not placed.  -Abdominal surgical history: Abdominal Hysterectomy, Open Cholecystectomy, Ex lap for SBO x2 in the 1980's, and Exploratory laparotomy for SBO in [facility] 2017 -Last colonoscopy: FH colon ca (dad  at age 84). Neg colon 05/2019 per GI note -Anticoagulants: Plavix (last dose 2 weeks ago) -Tbcc: 5 cigarettes per day, previously smoked 4 PPD No Alc use -Employment: disabled -Ambulates with walker or cane -Grandchildren live at home with her  ROS: Review of Systems  Gastrointestinal:  Positive for abdominal pain, constipation, nausea and vomiting.  All other systems reviewed and are negative.  Family History  Problem Relation Age of Onset   Hepatitis C Sister    Rectal cancer Paternal Grandfather    Colon cancer Maternal Aunt    Rectal cancer Maternal Aunt    Esophageal cancer Neg Hx    Stomach cancer Neg Hx     Past Medical History:  Diagnosis Date   Anxiety    Blood transfusion without reported diagnosis    CHF (congestive heart failure) (HCC)    Chronic pain syndrome    COPD (chronic obstructive pulmonary disease) (HCC)    GERD (gastroesophageal reflux disease)    HCV (hepatitis C virus)    Hyperlipidemia    Hypertension    Neuromuscular disorder ([place])    nerve damage due to stroke.   Osteoarthritis    Osteoporosis    Sleep apnea    Doesn't use CPAP   Stroke ([place]) 1996   one stroke and two TIA's   Thyroid disease     Past Surgical History:  Procedure Laterality Date   ABDOMINAL HYSTERECTOMY     ABDOMINAL SURGERY     CHOLECYSTECTOMY  COLONOSCOPY  12/26/2014   Very minimal sigmoid diverticulosis. Small internal hemorrhoids.    ESOPHAGOGASTRODUODENOSCOPY  01/21/2011   Normal EGD.    THYROID SURGERY     TOTAL SHOULDER REPLACEMENT Right 09/01/2018    Social History:  reports that she has been smoking cigarettes. She has been smoking an average of .5 packs per day. She has never used smokeless tobacco. She reports that she does not drink alcohol and does not use drugs.  Allergies:  Allergies  Allergen Reactions   Morphine And Related Other (See Comments), Hives and Itching    Per her doctor Per her doctor   Acetaminophen Other (See Comments)     Liver enlargement- hep C   Ibuprofen     Per her doctor   Nsaids     Per her doctor   Prednisone Other (See Comments)    Per her doctor   Statins Other (See Comments)    Affected liver enzymes Affected liver enzymes    Medications Prior to Admission  Medication Sig Dispense Refill   albuterol (ACCUNEB) 1.25 MG/3ML nebulizer solution Take 3 mLs by nebulization 3 (three) times daily as needed for wheezing or shortness of breath.      albuterol (VENTOLIN HFA) 108 (90 Base) MCG/ACT inhaler Inhale 2 puffs into the lungs every 6 (six) hours as needed for wheezing or shortness of breath.     aspirin 81 MG chewable tablet Chew 81 mg by mouth daily.     budesonide-formoterol (SYMBICORT) 160-4.5 MCG/ACT inhaler Inhale 2 puffs into the lungs 2 (two) times daily.     clopidogrel (PLAVIX) 75 MG tablet Take 75 mg by mouth daily.     diazepam (VALIUM) 5 MG tablet Take 5 mg by mouth 3 (three) times daily as needed.     diclofenac (VOLTAREN) 25 MG EC tablet Take 25 mg by mouth daily as needed for moderate pain or mild pain.     dicyclomine (BENTYL) 10 MG capsule Take 1 capsule (10 mg total) by mouth 2 (two) times daily. 60 capsule 2   fenofibrate 160 MG tablet Take 160 mg by mouth daily.     fluticasone (FLONASE) 50 MCG/ACT nasal spray Place 2 sprays into both nostrils daily as needed for allergies or rhinitis.     Fluticasone-Umeclidin-Vilant 100-62.5-25 MCG/ACT AEPB Take 1 puff by mouth daily.     furosemide (LASIX) 40 MG tablet Take 0.5 tablets (20 mg total) by mouth daily. 30 tablet 0   lubiprostone (AMITIZA) 24 MCG capsule Take 24 mcg by mouth 2 (two) times daily with a meal.     naloxone (NARCAN) nasal spray 4 mg/0.1 mL Place 1 spray into the nose once. INTO 1 NOSTRIL AND MAY REPEAT EVERY 2-3 MINUTES UNTIL EMERGENCY ARRIVES     Olmesartan-amLODIPine-HCTZ 20-5-12.5 MG TABS Take 1 tablet by mouth daily.     oxyCODONE (ROXICODONE) 15 MG immediate release tablet Take 15 mg by mouth 4 (four) times daily  as needed for pain.     pantoprazole (PROTONIX) 40 MG tablet Take 1 tablet (40 mg total) by mouth 2 (two) times daily. For 4 weeks and then decrease to daily 60 tablet 1   pregabalin (LYRICA) 200 MG capsule Take 200 mg by mouth 3 (three) times daily.      SUMAtriptan (IMITREX) 100 MG tablet Take 1 tablet by mouth 2 (two) times daily as needed. Take 1 tablet by mouth twice daily, at least 2 hours between doses as needed  Physical Exam: Blood pressure 118/68, pulse (!) 57, temperature 98.6 F (37 C), temperature source Oral, resp. rate 16, SpO2 100 %. General: pleasant, WD/WN female who is laying in bed in NAD HEENT: head is normocephalic, atraumatic.  Sclera are noninjected.  PERRL.  Ears and nose without any masses or lesions.  Mouth is pink and moist. Dentition fair Heart: regular, rate, and rhythm.  Normal s1,s2. No obvious murmurs, gallops, or rubs noted.  Palpable pedal pulses bilaterally  Lungs: CTAB, no wheezes, rhonchi, or rales noted.  Respiratory effort nonlabored Abd: Soft, mild distension, generalized tenderness greatest in the periumbilical and epigastric regions of the abdomen. No rigidity or guaurding. Hypoactive BS. No masses, hernias, or organomegaly. Prior abdominal scars well healed MS: no BUE/BLE edema, calves soft and nontender Skin: warm and dry with no masses, lesions, or rashes Psych: A&Ox4 with an appropriate affect Neuro: cranial nerves grossly intact, equal strength in BUE/BLE bilaterally, normal speech, thought process intact, moves all extremities, gait not assessed  Physical Exam Abdominal:       Comments: Prior abdominal scars       Results for orders placed or performed during the hospital encounter of 11/02/21 (from the past 48 hour(s))  CBC with Differential     Status: Abnormal   Collection Time: 11/02/21  7:27 PM  Result Value Ref Range   WBC 9.8 4.0 - 10.5 K/uL   RBC 5.85 (H) 3.87 - 5.11 MIL/uL   Hemoglobin 13.9 12.0 - 15.0 g/dL   HCT 42.5  36.0 - 46.0 %   MCV 72.6 (L) 80.0 - 100.0 fL   MCH 23.8 (L) 26.0 - 34.0 pg   MCHC 32.7 30.0 - 36.0 g/dL   RDW 15.4 11.5 - 15.5 %   Platelets 284 150 - 400 K/uL   nRBC 0.0 0.0 - 0.2 %   Neutrophils Relative % 77 %   Neutro Abs 7.6 1.7 - 7.7 K/uL   Lymphocytes Relative 17 %   Lymphs Abs 1.6 0.7 - 4.0 K/uL   Monocytes Relative 6 %   Monocytes Absolute 0.6 0.1 - 1.0 K/uL   Eosinophils Relative 0 %   Eosinophils Absolute 0.0 0.0 - 0.5 K/uL   Basophils Relative 0 %   Basophils Absolute 0.0 0.0 - 0.1 K/uL   Immature Granulocytes 0 %   Abs Immature Granulocytes 0.03 0.00 - 0.07 K/uL    Comment: Performed at [facility], [place] [address] [place], [place] [address]  Comprehensive metabolic panel     Status: Abnormal   Collection Time: 11/02/21  7:27 PM  Result Value Ref Range   Sodium 141 135 - 145 mmol/L   Potassium 3.7 3.5 - 5.1 mmol/L   Chloride 105 98 - 111 mmol/L   CO2 28 22 - 32 mmol/L   Glucose, Bld 111 (H) 70 - 99 mg/dL    Comment: Glucose reference range applies only to samples taken after fasting for at least 8 hours.   BUN 13 8 - 23 mg/dL   Creatinine, Ser 0.62 0.44 - 1.00 mg/dL   Calcium 9.1 8.9 - 10.3 mg/dL   Total Protein 7.7 6.5 - 8.1 g/dL   Albumin 4.1 3.5 - 5.0 g/dL   AST 16 15 - 41 U/L   ALT 19 0 - 44 U/L   Alkaline Phosphatase 84 38 - 126 U/L   Total Bilirubin 0.6 0.3 - 1.2 mg/dL   GFR, Estimated >60 >60 mL/min    Comment: (NOTE) Calculated using the CKD-EPI  Creatinine Equation (2021)    Anion gap 8 5 - 15    Comment: Performed at [facility], [place] [address] [place], [place] [address]  Lactic acid, plasma     Status: None   Collection Time: 11/02/21  7:28 PM  Result Value Ref Range   Lactic Acid, Venous 1.7 0.5 - 1.9 mmol/L    Comment: Performed at [facility], [place] [address], [place], [place] [address]  Resp Panel by RT-PCR (Flu A&B, Covid) Nasopharyngeal Swab     Status: None   Collection Time:  11/02/21  9:56 PM   Specimen: Nasopharyngeal Swab; Nasopharyngeal(NP) swabs in vial transport medium  Result Value Ref Range   SARS Coronavirus 2 by RT PCR NEGATIVE NEGATIVE    Comment: (NOTE) SARS-CoV-2 target nucleic acids are NOT DETECTED.  The SARS-CoV-2 RNA is generally detectable in upper respiratory specimens during the acute phase of infection. The lowest concentration of SARS-CoV-2 viral copies this assay can detect is 138 copies/mL. A negative result does not preclude SARS-Cov-2 infection and should not be used as the sole basis for treatment or other patient management decisions. A negative result may occur with  improper specimen collection/handling, submission of specimen other than nasopharyngeal swab, presence of viral mutation(s) within the areas targeted by this assay, and inadequate number of viral copies(<138 copies/mL). A negative result must be combined with clinical observations, patient history, and epidemiological information. The expected result is Negative.  Fact Sheet for Patients:  EntrepreneurPulse.com.au  Fact Sheet for Healthcare Providers:  IncredibleEmployment.be  This test is no t yet approved or cleared by the [place] FDA and  has been authorized for detection and/or diagnosis of SARS-CoV-2 by FDA under an Emergency Use Authorization (EUA). This EUA will remain  in effect (meaning this test can be used) for the duration of the COVID-19 declaration under Section 564(b)(1) of the Act, 21 U.S.C.section 360bbb-3(b)(1), unless the authorization is terminated  or revoked sooner.       Influenza A by PCR NEGATIVE NEGATIVE   Influenza B by PCR NEGATIVE NEGATIVE    Comment: (NOTE) The Xpert Xpress SARS-CoV-2/FLU/RSV plus assay is intended as an aid in the diagnosis of influenza from Nasopharyngeal swab specimens and should not be used as a sole basis for treatment. Nasal washings and aspirates are  unacceptable for Xpert Xpress SARS-CoV-2/FLU/RSV testing.  Fact Sheet for Patients: EntrepreneurPulse.com.au  Fact Sheet for Healthcare Providers: IncredibleEmployment.be  This test is not yet approved or cleared by the [place] FDA and has been authorized for detection and/or diagnosis of SARS-CoV-2 by FDA under an Emergency Use Authorization (EUA). This EUA will remain in effect (meaning this test can be used) for the duration of the COVID-19 declaration under Section 564(b)(1) of the Act, 21 U.S.C. section 360bbb-3(b)(1), unless the authorization is terminated or revoked.  Performed at [facility], [place] [address] [place], [place] [address]   Basic metabolic panel     Status: Abnormal   Collection Time: 11/03/21  5:25 AM  Result Value Ref Range   Sodium 139 135 - 145 mmol/L   Potassium 3.4 (L) 3.5 - 5.1 mmol/L   Chloride 103 98 - 111 mmol/L   CO2 28 22 - 32 mmol/L   Glucose, Bld 102 (H) 70 - 99 mg/dL    Comment: Glucose reference range applies only to samples taken after fasting for at least 8 hours.   BUN 10 8 - 23 mg/dL   Creatinine, Ser 0.65  0.44 - 1.00 mg/dL   Calcium 8.5 (L) 8.9 - 10.3 mg/dL   GFR, Estimated >60 >60 mL/min    Comment: (NOTE) Calculated using the CKD-EPI Creatinine Equation (2021)    Anion gap 8 5 - 15    Comment: Performed at [facility], [place] [address] [place], [place] [address]  CBC     Status: Abnormal   Collection Time: 11/03/21  5:25 AM  Result Value Ref Range   WBC 7.5 4.0 - 10.5 K/uL   RBC 5.29 (H) 3.87 - 5.11 MIL/uL   Hemoglobin 12.7 12.0 - 15.0 g/dL   HCT 39.6 36.0 - 46.0 %   MCV 74.9 (L) 80.0 - 100.0 fL   MCH 24.0 (L) 26.0 - 34.0 pg   MCHC 32.1 30.0 - 36.0 g/dL   RDW 15.3 11.5 - 15.5 %   Platelets 263 150 - 400 K/uL   nRBC 0.0 0.0 - 0.2 %    Comment: Performed at [facility], [place] [address], [place], [place] [address]   DG ABD  ACUTE 2+V W 1V CHEST  Result Date: 11/02/2021 CLINICAL DATA:  Increasing abdominal pain. Small-bowel obstruction 2 months ago. EXAM: DG ABDOMEN ACUTE WITH 1 VIEW CHEST COMPARISON:  Abdomen series 09/15/2021, CT abdomen and pelvis with IV contrast 09/10/2021 FINDINGS: Within normal limits retained stool burden. There is mild dilatation of a few left mid abdominal small bowel segments up to 3.5 cm. No other small bowel dilatation is seen. There are no findings of acute pneumoperitoneum. No radiopaque calculi are identified. There are right upper quadrant surgical clips. Visceral shadows are stable. Heart size and mediastinal contours are within normal limits. Both lungs are clear. There are bilateral shoulder arthroplasties and a right IJ port catheter with tip in the mid SVC. IMPRESSION: 1. A few mildly dilated left mid abdominal small bowel segments, could indicate evidence of a regional ileus or low-grade small bowel obstruction, with colonic gas seen through into the sigmoid segment. 2. No other radiographic evidence of acute process. Incidental findings as above. Electronically Signed   By: Telford Nab M.D.   On: 11/02/2021 21:09    Anti-infectives (From admission, onward)    None       Assessment/Plan SBO - Hx of Abdominal Hysterectomy, Open Cholecystectomy, Ex lap for SBO x2 in the 1980's, and Exploratory laparotomy for SBO in [facility] 2017 - Recommend placement of NGT for decompression and obtain CT A/P  - No current indication for emergency surgery. Further recs to follow after CT.   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

## 2021-11-04 ENCOUNTER — Inpatient Hospital Stay (HOSPITAL_COMMUNITY): Payer: Medicare Other

## 2021-11-04 ENCOUNTER — Telehealth: Payer: Self-pay | Admitting: Gastroenterology

## 2021-11-04 DIAGNOSIS — J418 Mixed simple and mucopurulent chronic bronchitis: Secondary | ICD-10-CM

## 2021-11-04 DIAGNOSIS — Z8673 Personal history of transient ischemic attack (TIA), and cerebral infarction without residual deficits: Secondary | ICD-10-CM

## 2021-11-04 LAB — COMPREHENSIVE METABOLIC PANEL
ALT: 14 U/L (ref 0–44)
AST: 13 U/L — ABNORMAL LOW (ref 15–41)
Albumin: 3.1 g/dL — ABNORMAL LOW (ref 3.5–5.0)
Alkaline Phosphatase: 69 U/L (ref 38–126)
Anion gap: 6 (ref 5–15)
BUN: 10 mg/dL (ref 8–23)
CO2: 30 mmol/L (ref 22–32)
Calcium: 8.4 mg/dL — ABNORMAL LOW (ref 8.9–10.3)
Chloride: 106 mmol/L (ref 98–111)
Creatinine, Ser: 0.83 mg/dL (ref 0.44–1.00)
GFR, Estimated: >60 mL/min (ref >60)
Glucose, Bld: 81 mg/dL (ref 70–99)
Potassium: 3.9 mmol/L (ref 3.5–5.1)
Sodium: 142 mmol/L (ref 135–145)
Total Bilirubin: 0.8 mg/dL (ref 0.3–1.2)
Total Protein: 5.9 g/dL — ABNORMAL LOW (ref 6.5–8.1)

## 2021-11-04 LAB — MAGNESIUM: Magnesium: 1.8 mg/dL (ref 1.7–2.4)

## 2021-11-04 MED ORDER — PHENOL 1.4 % MT LIQD
1.0000 | OROMUCOSAL | Status: DC | PRN
  Administered 2021-11-04: 1 via OROMUCOSAL
  Filled 2021-11-04: qty 177

## 2021-11-04 MED ORDER — SODIUM CHLORIDE (PF) 0.9 % IJ SOLN
INTRAMUSCULAR | Status: AC
  Filled 2021-11-04: qty 50

## 2021-11-04 MED ORDER — IOHEXOL 350 MG/ML SOLN
80.0000 mL | Freq: Once | INTRAVENOUS | Status: AC | PRN
  Administered 2021-11-04: 80 mL via INTRA_ARTERIAL

## 2021-11-04 MED ORDER — PIPERACILLIN-TAZOBACTAM 3.375 G IVPB
3.3750 g | Freq: Three times a day (TID) | INTRAVENOUS | Status: DC
  Administered 2021-11-04 – 2021-11-08 (×12): 3.375 g via INTRAVENOUS
  Filled 2021-11-04 (×14): qty 50

## 2021-11-04 NOTE — Progress Notes (Signed)
Pharmacy Antibiotic Note  Caitlyn Pacheco is a 65 y.o. female admitted on 11/02/2021 with recurrent small bowel obstruction .  Pharmacy has been consulted for Zoxysn dosing.  Plan: Zosyn 3.375 g EI q 8 hours  Pharmacy will sign off and follow remotely.     Temp (24hrs), Avg:98.2 F (36.8 C), Min:97.8 F (36.6 C), Max:98.6 F (37 C)  Recent Labs  Lab 11/02/21 1927 11/02/21 1928 11/03/21 0525 11/04/21 0333  WBC 9.8  --  7.5  --   CREATININE 0.62  --  0.65 0.83  LATICACIDVEN  --  1.7  --   --     CrCl cannot be calculated (Unknown ideal weight.).    Allergies  Allergen Reactions   Morphine And Related Other (See Comments), Hives and Itching    Per her doctor Per her doctor   Acetaminophen Other (See Comments)    Liver enlargement- hep C   Ibuprofen     Per her doctor   Nsaids     Per her doctor   Prednisone Other (See Comments)    Per her doctor   Statins Other (See Comments)    Affected liver enzymes Affected liver enzymes    Thank you for allowing pharmacy to be a part of this patients care.  Napoleon Form 11/04/2021 3:36 PM

## 2021-11-04 NOTE — Progress Notes (Signed)
[facility] Progress Note     Subjective: CC:  Reports ongoing abdominal pain, nausea, and even emesis around NGT. Pain described as the same as yesterday. Did not tolerate PO contrast. Denies flatus or BM.  Objective: Vital signs in last 24 hours: Temp:  [97.8 F (36.6 C)-98.6 F (37 C)] 98.1 F (36.7 C) (12/15 0529) Pulse Rate:  [58-68] 68 (12/15 0529) Resp:  [16-18] 18 (12/15 0529) BP: (110-122)/(59-70) 122/69 (12/15 0529) SpO2:  [94 %-100 %] 94 % (12/15 0742) Last BM Date: 10/30/21 (per patient)  Intake/Output from previous day: 12/14 0701 - 12/15 0700 In: 1942.1 [P.O.:480; I.V.:842.1; IV Piggyback:500] Out: 1050 [Urine:950; Emesis/NG output:100] Intake/Output this shift: No intake/output data recorded.  PE: Gen:  Alert, NAD, pleasant Card:  Regular rate and rhythm, pedal pulses 2+ BL Pulm:  Normal effort, clear to auscultation bilaterally Abd: Soft, mild global tenderness worse over umbilicus, no peritonitis, NG in place with small amt bilious fluid in cannister - flushed and I tris functioning appropriately.  Skin: warm and dry, no rashes  Psych: A&Ox3   Lab Results:  Recent Labs    11/02/21 1927 11/03/21 0525  WBC 9.8 7.5  HGB 13.9 12.7  HCT 42.5 39.6  PLT 284 263   BMET Recent Labs    11/03/21 0525 11/04/21 0333  NA 139 142  K 3.4* 3.9  CL 103 106  CO2 28 30  GLUCOSE 102* 81  BUN 10 10  CREATININE 0.65 0.83  CALCIUM 8.5* 8.4*   PT/INR No results for input(s): LABPROT, INR in the last 72 hours. CMP     Component Value Date/Time   NA 142 11/04/2021 0333   K 3.9 11/04/2021 0333   CL 106 11/04/2021 0333   CO2 30 11/04/2021 0333   GLUCOSE 81 11/04/2021 0333   BUN 10 11/04/2021 0333   CREATININE 0.83 11/04/2021 0333   CALCIUM 8.4 (L) 11/04/2021 0333   PROT 5.9 (L) 11/04/2021 0333   ALBUMIN 3.1 (L) 11/04/2021 0333   AST 13 (L) 11/04/2021 0333   ALT 14 11/04/2021 0333   ALKPHOS 69 11/04/2021 0333   BILITOT 0.8 11/04/2021 0333    GFRNONAA >60 11/04/2021 0333   GFRAA >60 08/15/2018 1102   Lipase     Component Value Date/Time   LIPASE 21 09/10/2021 1034       Studies/Results: Abd 1 View (KUB)  Result Date: 11/03/2021 CLINICAL DATA:  Small bowel obstruction. EXAM: ABDOMEN - 1 VIEW COMPARISON:  11/02/2021 FINDINGS: Scattered gas and an ordinary amount of stool are present in the colon. There is a paucity of small bowel gas. No dilated loops of bowel are seen to clearly indicate obstruction. Right upper quadrant abdominal surgical clips are noted. No acute osseous abnormality is seen. IMPRESSION: No evidence of bowel obstruction. Electronically Signed   By: Logan Bores M.D.   On: 11/03/2021 08:21   DG ABD ACUTE 2+V W 1V CHEST  Result Date: 11/02/2021 CLINICAL DATA:  Increasing abdominal pain. Small-bowel obstruction 2 months ago. EXAM: DG ABDOMEN ACUTE WITH 1 VIEW CHEST COMPARISON:  Abdomen series 09/15/2021, CT abdomen and pelvis with IV contrast 09/10/2021 FINDINGS: Within normal limits retained stool burden. There is mild dilatation of a few left mid abdominal small bowel segments up to 3.5 cm. No other small bowel dilatation is seen. There are no findings of acute pneumoperitoneum. No radiopaque calculi are identified. There are right upper quadrant surgical clips. Visceral shadows are stable. Heart size and mediastinal contours are within normal  limits. Both lungs are clear. There are bilateral shoulder arthroplasties and a right IJ port catheter with tip in the mid SVC. IMPRESSION: 1. A few mildly dilated left mid abdominal small bowel segments, could indicate evidence of a regional ileus or low-grade small bowel obstruction, with colonic gas seen through into the sigmoid segment. 2. No other radiographic evidence of acute process. Incidental findings as above. Electronically Signed   By: Telford Nab M.D.   On: 11/02/2021 21:09   DG Abd Portable 1V  Result Date: 11/03/2021 CLINICAL DATA:  NG tube placement.  EXAM: PORTABLE ABDOMEN - 1 VIEW COMPARISON:  Earlier film, same date. FINDINGS: The NG tube tip is in the antropyloric region of the stomach. Unremarkable bowel gas pattern. IMPRESSION: NG tube tip is in the antropyloric region of the stomach. Electronically Signed   By: Marijo Sanes M.D.   On: 11/03/2021 13:35    Anti-infectives: Anti-infectives (From admission, onward)    None        Assessment/Plan  SBO - Hx of Abdominal Hysterectomy, Open Cholecystectomy, Ex lap for SBO x2 in the 1980's, and Exploratory laparotomy for SBO in [facility] 2017 - Continue NG to LIWS.  - CT scan done this AM - await final read.  - No current indication for emergency surgery. Further recs to follow after CT.  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

## 2021-11-04 NOTE — Progress Notes (Signed)
PROGRESS NOTE    Caitlyn Pacheco  NTI:[id number] DOB: 1956-01-01 DOA: 11/02/2021 PCP: Harvie Junior, MD    Brief Narrative:  65 y.o. female with medical history significant for COPD, recurrent small bowel obstruction, history of CVA, HTN, HLD, hepatitis C s/p Epclusa, chronic pain syndrome, anxiety who is admitted with recurrent small bowel obstruction.  Assessment & Plan:   Principal Problem:   Small bowel obstruction (HCC) Active Problems:   HTN (hypertension)   Chronic pain syndrome   COPD (chronic obstructive pulmonary disease) (HCC)   History of transient ischemic attack (TIA)  Small bowel obstruction with enteritis SB inflammation: NG tube tempted to be placed in the ED but unsuccessful, later placed successfully on the floor per Surgery -General Surgery following -Cont NPO status -Per Surgery, CT ordered this AM, reviewed. Findings of severe wall thickening involving SB concerning for enteritis or inflammation -No leukocytosis -Given CT findings, had ordered empiric zosyn   COPD: Stable.  Continue Trelegy and albuterol as needed. Without wheezing this AM   Hypertension: Oral antihypertensives on hold while NPO.   Chronic pain syndrome/anxiety: Home oxycodone, Lyrica, Bentyl on hold while NPO.  On IV Dilaudid as needed.  Using IV diazepam 5 mg TID prn anxiety while NPO.   History of CVA: Would resume home aspirin, Plavix when able and when pt is able to reliably tolerate PO   GERD: Use IV Protonix 40 mg twice daily while NPO.   DVT prophylaxis: Heparin subq Code Status: Full Family Communication: Pt in room, family not at bedside  Status is: Inpatient  Remains inpatient appropriate because: Acuity of illness, needing NPO status for active SBO    Consultants:  General Surgery  Procedures:    Antimicrobials: Anti-infectives (From admission, onward)    None       Subjective: Still complaining of abd discomfort, not much change from  yesterday  Objective: Vitals:   11/04/21 0529 11/04/21 0740 11/04/21 0742 11/04/21 1211  BP: 122/69   127/63  Pulse: 68   72  Resp: 18   18  Temp: 98.1 F (36.7 C)   98.2 F (36.8 C)  TempSrc: Oral   Oral  SpO2: 96% 94% 94% 100%    Intake/Output Summary (Last 24 hours) at 11/04/2021 1517 Last data filed at 11/04/2021 1400 Gross per 24 hour  Intake 950 ml  Output 2000 ml  Net -1050 ml    There were no vitals filed for this visit.  Examination: General exam: Conversant, in no acute distress Respiratory system: normal chest rise, clear, no audible wheezing Cardiovascular system: regular rhythm, s1-s2 Gastrointestinal system: distended, decreased BS Central nervous system: No seizures, no tremors Extremities: No cyanosis, no joint deformities Skin: No rashes, no pallor Psychiatry: Affect normal // no auditory hallucinations   Data Reviewed: I have personally reviewed following labs and imaging studies  CBC: Recent Labs  Lab 11/02/21 1927 11/03/21 0525  WBC 9.8 7.5  NEUTROABS 7.6  --   HGB 13.9 12.7  HCT 42.5 39.6  MCV 72.6* 74.9*  PLT 284 867    Basic Metabolic Panel: Recent Labs  Lab 11/02/21 1927 11/03/21 0525 11/04/21 0333  NA 141 139 142  K 3.7 3.4* 3.9  CL 105 103 106  CO2 28 28 30   GLUCOSE 111* 102* 81  BUN 13 10 10   CREATININE 0.62 0.65 0.83  CALCIUM 9.1 8.5* 8.4*  MG  --   --  1.8    GFR: CrCl cannot be calculated (Unknown ideal weight.).  Liver Function Tests: Recent Labs  Lab 11/02/21 1927 11/04/21 0333  AST 16 13*  ALT 19 14  ALKPHOS 84 69  BILITOT 0.6 0.8  PROT 7.7 5.9*  ALBUMIN 4.1 3.1*    No results for input(s): LIPASE, AMYLASE in the last 168 hours. No results for input(s): AMMONIA in the last 168 hours. Coagulation Profile: No results for input(s): INR, PROTIME in the last 168 hours. Cardiac Enzymes: No results for input(s): CKTOTAL, CKMB, CKMBINDEX, TROPONINI in the last 168 hours. BNP (last 3 results) No results  for input(s): PROBNP in the last 8760 hours. HbA1C: No results for input(s): HGBA1C in the last 72 hours. CBG: No results for input(s): GLUCAP in the last 168 hours. Lipid Profile: No results for input(s): CHOL, HDL, LDLCALC, TRIG, CHOLHDL, LDLDIRECT in the last 72 hours. Thyroid Function Tests: No results for input(s): TSH, T4TOTAL, FREET4, T3FREE, THYROIDAB in the last 72 hours. Anemia Panel: No results for input(s): VITAMINB12, FOLATE, FERRITIN, TIBC, IRON, RETICCTPCT in the last 72 hours. Sepsis Labs: Recent Labs  Lab 11/02/21 1928  LATICACIDVEN 1.7     Recent Results (from the past 240 hour(s))  Resp Panel by RT-PCR (Flu A&B, Covid) Nasopharyngeal Swab     Status: None   Collection Time: 11/02/21  9:56 PM   Specimen: Nasopharyngeal Swab; Nasopharyngeal(NP) swabs in vial transport medium  Result Value Ref Range Status   SARS Coronavirus 2 by RT PCR NEGATIVE NEGATIVE Final    Comment: (NOTE) SARS-CoV-2 target nucleic acids are NOT DETECTED.  The SARS-CoV-2 RNA is generally detectable in upper respiratory specimens during the acute phase of infection. The lowest concentration of SARS-CoV-2 viral copies this assay can detect is 138 copies/mL. A negative result does not preclude SARS-Cov-2 infection and should not be used as the sole basis for treatment or other patient management decisions. A negative result may occur with  improper specimen collection/handling, submission of specimen other than nasopharyngeal swab, presence of viral mutation(s) within the areas targeted by this assay, and inadequate number of viral copies(<138 copies/mL). A negative result must be combined with clinical observations, patient history, and epidemiological information. The expected result is Negative.  Fact Sheet for Patients:  EntrepreneurPulse.com.au  Fact Sheet for Healthcare Providers:  IncredibleEmployment.be  This test is no t yet approved or  cleared by the [place] FDA and  has been authorized for detection and/or diagnosis of SARS-CoV-2 by FDA under an Emergency Use Authorization (EUA). This EUA will remain  in effect (meaning this test can be used) for the duration of the COVID-19 declaration under Section 564(b)(1) of the Act, 21 U.S.C.section 360bbb-3(b)(1), unless the authorization is terminated  or revoked sooner.       Influenza A by PCR NEGATIVE NEGATIVE Final   Influenza B by PCR NEGATIVE NEGATIVE Final    Comment: (NOTE) The Xpert Xpress SARS-CoV-2/FLU/RSV plus assay is intended as an aid in the diagnosis of influenza from Nasopharyngeal swab specimens and should not be used as a sole basis for treatment. Nasal washings and aspirates are unacceptable for Xpert Xpress SARS-CoV-2/FLU/RSV testing.  Fact Sheet for Patients: EntrepreneurPulse.com.au  Fact Sheet for Healthcare Providers: IncredibleEmployment.be  This test is not yet approved or cleared by the [place] FDA and has been authorized for detection and/or diagnosis of SARS-CoV-2 by FDA under an Emergency Use Authorization (EUA). This EUA will remain in effect (meaning this test can be used) for the duration of the COVID-19 declaration under Section 564(b)(1) of the Act, 21 U.S.C. section  360bbb-3(b)(1), unless the authorization is terminated or revoked.  Performed at [facility], [place] [address], [place], [place] [address]       Radiology Studies: Abd 1 View (KUB)  Result Date: 11/03/2021 CLINICAL DATA:  Small bowel obstruction. EXAM: ABDOMEN - 1 VIEW COMPARISON:  11/02/2021 FINDINGS: Scattered gas and an ordinary amount of stool are present in the colon. There is a paucity of small bowel gas. No dilated loops of bowel are seen to clearly indicate obstruction. Right upper quadrant abdominal surgical clips are noted. No acute osseous abnormality is seen. IMPRESSION: No evidence of  bowel obstruction. Electronically Signed   By: Logan Bores M.D.   On: 11/03/2021 08:21   CT ABDOMEN PELVIS W CONTRAST  Result Date: 11/04/2021 CLINICAL DATA:  Nausea, vomiting. EXAM: CT ABDOMEN AND PELVIS WITH CONTRAST TECHNIQUE: Multidetector CT imaging of the abdomen and pelvis was performed using the standard protocol following bolus administration of intravenous contrast. CONTRAST:  85mL OMNIPAQUE IOHEXOL 350 MG/ML SOLN COMPARISON:  September 10, 2021. FINDINGS: Lower chest: No acute abnormality. Hepatobiliary: Status post cholecystectomy. Mild intrahepatic and extrahepatic biliary dilatation is noted which may be related to prior cholecystectomy. No definite hepatic abnormality is noted. Pancreas: Unremarkable. No pancreatic ductal dilatation or surrounding inflammatory changes. Spleen: Normal in size without focal abnormality. Adrenals/Urinary Tract: Adrenal glands are unremarkable. Kidneys are normal, without renal calculi, focal lesion, or hydronephrosis. Bladder is unremarkable. Stomach/Bowel: Nasogastric tube tip is seen in distal stomach. The appendix is not clearly visualized. No colonic dilatation is noted. Small bowel reanastomosis is again noted in the right lower quadrant. There is now noted multiple loops of small bowel in the central abdomen which are thick walled with surrounding inflammatory changes concerning for focal enteritis or other inflammation. Vascular/Lymphatic: Aortic atherosclerosis. No enlarged abdominal or pelvic lymph nodes. Reproductive: Status post hysterectomy. No adnexal masses. Other: No abdominal wall hernia or abnormality. No abdominopelvic ascites. Musculoskeletal: No acute or significant osseous findings. IMPRESSION: There is interval development of significant wall thickening and surrounding inflammatory changes involving multiple small bowel loops in the central portion of the abdomen concerning for severe enteritis or other inflammation. No abnormal bowel dilatation  is noted. Mild intrahepatic and extrahepatic biliary dilatation is noted which may be related to prior cholecystectomy. Aortic Atherosclerosis (ICD10-I70.0). Electronically Signed   By: Marijo Conception M.D.   On: 11/04/2021 10:40   DG ABD ACUTE 2+V W 1V CHEST  Result Date: 11/02/2021 CLINICAL DATA:  Increasing abdominal pain. Small-bowel obstruction 2 months ago. EXAM: DG ABDOMEN ACUTE WITH 1 VIEW CHEST COMPARISON:  Abdomen series 09/15/2021, CT abdomen and pelvis with IV contrast 09/10/2021 FINDINGS: Within normal limits retained stool burden. There is mild dilatation of a few left mid abdominal small bowel segments up to 3.5 cm. No other small bowel dilatation is seen. There are no findings of acute pneumoperitoneum. No radiopaque calculi are identified. There are right upper quadrant surgical clips. Visceral shadows are stable. Heart size and mediastinal contours are within normal limits. Both lungs are clear. There are bilateral shoulder arthroplasties and a right IJ port catheter with tip in the mid SVC. IMPRESSION: 1. A few mildly dilated left mid abdominal small bowel segments, could indicate evidence of a regional ileus or low-grade small bowel obstruction, with colonic gas seen through into the sigmoid segment. 2. No other radiographic evidence of acute process. Incidental findings as above. Electronically Signed   By: Telford Nab M.D.   On: 11/02/2021 21:09   DG  Abd Portable 1V  Result Date: 11/03/2021 CLINICAL DATA:  NG tube placement. EXAM: PORTABLE ABDOMEN - 1 VIEW COMPARISON:  Earlier film, same date. FINDINGS: The NG tube tip is in the antropyloric region of the stomach. Unremarkable bowel gas pattern. IMPRESSION: NG tube tip is in the antropyloric region of the stomach. Electronically Signed   By: Marijo Sanes M.D.   On: 11/03/2021 13:35    Scheduled Meds:  Chlorhexidine Gluconate Cloth  6 each Topical Daily   umeclidinium bromide  1 puff Inhalation Daily   And   fluticasone  furoate-vilanterol  1 puff Inhalation Daily   heparin  5,000 Units Subcutaneous Q8H   pantoprazole (PROTONIX) IV  40 mg Intravenous Q12H   sodium chloride (PF)       sodium chloride flush  10-40 mL Intracatheter Q12H   Continuous Infusions:  promethazine (PHENERGAN) injection (IM or IVPB) 25 mg (11/04/21 1228)     LOS: 2 days   Marylu Lund, MD [facility] Pager On Amion  If 7PM-7AM, please contact night-coverage 11/04/2021, 3:17 PM

## 2021-11-04 NOTE — Telephone Encounter (Signed)
Pt stated that she was admitted to [facility] on Tuesday for a SBO. Pt currently has NG tube and being treated with IV zosyn for Enteritis that was shown on CT scan this AM. Pt stated that she wanted to make sure that Dr. Lyndel Safe was aware of what is going on with her. Pt was notified that Dr. Lyndel Safe will be sent a message and made aware of her current condition. Pt verbalized understanding with all questions answered.

## 2021-11-04 NOTE — Telephone Encounter (Signed)
Inbound call from patient, stated that she was in the hospital at [facility] for another bowel blockage. Stated that she would like to speak with you and see what Dr. Lyndel Safe wpuld like to do. Stated that she did not want to have surgery with them. Please advise.

## 2021-11-05 LAB — COMPREHENSIVE METABOLIC PANEL
ALT: 16 U/L (ref 0–44)
AST: 19 U/L (ref 15–41)
Albumin: 3.5 g/dL (ref 3.5–5.0)
Alkaline Phosphatase: 77 U/L (ref 38–126)
Anion gap: 10 (ref 5–15)
BUN: 11 mg/dL (ref 8–23)
CO2: 26 mmol/L (ref 22–32)
Calcium: 8.8 mg/dL — ABNORMAL LOW (ref 8.9–10.3)
Chloride: 103 mmol/L (ref 98–111)
Creatinine, Ser: 0.84 mg/dL (ref 0.44–1.00)
GFR, Estimated: >60 mL/min (ref >60)
Glucose, Bld: 68 mg/dL — ABNORMAL LOW (ref 70–99)
Potassium: 4 mmol/L (ref 3.5–5.1)
Sodium: 139 mmol/L (ref 135–145)
Total Bilirubin: 1 mg/dL (ref 0.3–1.2)
Total Protein: 6.7 g/dL (ref 6.5–8.1)

## 2021-11-05 LAB — CBC
HCT: 38.2 % (ref 36.0–46.0)
Hemoglobin: 12.2 g/dL (ref 12.0–15.0)
MCH: 23.7 pg — ABNORMAL LOW (ref 26.0–34.0)
MCHC: 31.9 g/dL (ref 30.0–36.0)
MCV: 74.3 fL — ABNORMAL LOW (ref 80.0–100.0)
Platelets: 242 10*3/uL (ref 150–400)
RBC: 5.14 MIL/uL — ABNORMAL HIGH (ref 3.87–5.11)
RDW: 14.9 % (ref 11.5–15.5)
WBC: 7 10*3/uL (ref 4.0–10.5)
nRBC: 0 % (ref 0.0–0.2)

## 2021-11-05 NOTE — Progress Notes (Signed)
PROGRESS NOTE    Caitlyn Pacheco  QQP:[id number] DOB: 11/17/1956 DOA: 11/02/2021 PCP: Harvie Junior, MD    Brief Narrative:  65 y.o. female with medical history significant for COPD, recurrent small bowel obstruction, history of CVA, HTN, HLD, hepatitis C s/p Epclusa, chronic pain syndrome, anxiety who is admitted with recurrent small bowel obstruction.  Assessment & Plan:   Principal Problem:   Small bowel obstruction (HCC) Active Problems:   HTN (hypertension)   Chronic pain syndrome   COPD (chronic obstructive pulmonary disease) (HCC)   History of transient ischemic attack (TIA)  Small bowel obstruction with enteritis SB inflammation: NG tube tempted to be placed in the ED but unsuccessful, later placed successfully on the floor per Surgery -General Surgery following -diet to be advanced as tolerated -CT findings of severe wall thickening involving SB concerning for enteritis or inflammation, now on zosyn -General Surgery has since signed off -cont analgesia and supportive care   COPD: Stable.  Continue Trelegy and albuterol as needed. No wheezing this AM   Hypertension: Oral antihypertensives on hold while NPO.   Chronic pain syndrome/anxiety: Home oxycodone, Lyrica, Bentyl on hold while NPO.  On IV Dilaudid as needed.  On IV diazepam 5 mg TID prn anxiety while NPO.   History of CVA: Would resume home aspirin, Plavix when able and when pt is able to reliably tolerate PO   GERD: Currently on BID PPI   DVT prophylaxis: Heparin subq Code Status: Full Family Communication: Pt in room, family not at bedside  Status is: Inpatient  Remains inpatient appropriate because: Acuity of illness, needing NPO status for active SBO    Consultants:  General Surgery  Procedures:    Antimicrobials: Anti-infectives (From admission, onward)    Start     Dose/Rate Route Frequency Ordered Stop   11/04/21 1630  piperacillin-tazobactam (ZOSYN) IVPB 3.375 g        3.375  g 12.5 mL/hr over 240 Minutes Intravenous Every 8 hours 11/04/21 1536         Subjective: Continues with abd pain but wanting to advance diet, hoping to be d/c'd soon  Objective: Vitals:   11/05/21 0403 11/05/21 0557 11/05/21 0824 11/05/21 1410  BP: 122/65 135/78  135/66  Pulse: 71 74  (!) 57  Resp: 17 18  16   Temp:  99.2 F (37.3 C)  98.4 F (36.9 C)  TempSrc:  Oral  Oral  SpO2: 96% 95% 95% 99%    Intake/Output Summary (Last 24 hours) at 11/05/2021 1442 Last data filed at 11/05/2021 1400 Gross per 24 hour  Intake 446.9 ml  Output 1900 ml  Net -1453.1 ml    There were no vitals filed for this visit.  Examination: General exam: Awake, laying in bed, in nad Respiratory system: Normal respiratory effort, no wheezing Cardiovascular system: regular rate, s1, s2 Gastrointestinal system: Soft, nondistended, positive BS Central nervous system: CN2-12 grossly intact, strength intact Extremities: Perfused, no clubbing Skin: Normal skin turgor, no notable skin lesions seen Psychiatry: Mood normal // no visual hallucinations   Data Reviewed: I have personally reviewed following labs and imaging studies  CBC: Recent Labs  Lab 11/02/21 1927 11/03/21 0525 11/05/21 0401  WBC 9.8 7.5 7.0  NEUTROABS 7.6  --   --   HGB 13.9 12.7 12.2  HCT 42.5 39.6 38.2  MCV 72.6* 74.9* 74.3*  PLT 284 263 712    Basic Metabolic Panel: Recent Labs  Lab 11/02/21 1927 11/03/21 0525 11/04/21 0333 11/05/21 0401  NA 141 139 142 139  K 3.7 3.4* 3.9 4.0  CL 105 103 106 103  CO2 28 28 30 26   GLUCOSE 111* 102* 81 68*  BUN 13 10 10 11   CREATININE 0.62 0.65 0.83 0.84  CALCIUM 9.1 8.5* 8.4* 8.8*  MG  --   --  1.8  --     GFR: CrCl cannot be calculated (Unknown ideal weight.). Liver Function Tests: Recent Labs  Lab 11/02/21 1927 11/04/21 0333 11/05/21 0401  AST 16 13* 19  ALT 19 14 16   ALKPHOS 84 69 77  BILITOT 0.6 0.8 1.0  PROT 7.7 5.9* 6.7  ALBUMIN 4.1 3.1* 3.5    No  results for input(s): LIPASE, AMYLASE in the last 168 hours. No results for input(s): AMMONIA in the last 168 hours. Coagulation Profile: No results for input(s): INR, PROTIME in the last 168 hours. Cardiac Enzymes: No results for input(s): CKTOTAL, CKMB, CKMBINDEX, TROPONINI in the last 168 hours. BNP (last 3 results) No results for input(s): PROBNP in the last 8760 hours. HbA1C: No results for input(s): HGBA1C in the last 72 hours. CBG: No results for input(s): GLUCAP in the last 168 hours. Lipid Profile: No results for input(s): CHOL, HDL, LDLCALC, TRIG, CHOLHDL, LDLDIRECT in the last 72 hours. Thyroid Function Tests: No results for input(s): TSH, T4TOTAL, FREET4, T3FREE, THYROIDAB in the last 72 hours. Anemia Panel: No results for input(s): VITAMINB12, FOLATE, FERRITIN, TIBC, IRON, RETICCTPCT in the last 72 hours. Sepsis Labs: Recent Labs  Lab 11/02/21 1928  LATICACIDVEN 1.7     Recent Results (from the past 240 hour(s))  Resp Panel by RT-PCR (Flu A&B, Covid) Nasopharyngeal Swab     Status: None   Collection Time: 11/02/21  9:56 PM   Specimen: Nasopharyngeal Swab; Nasopharyngeal(NP) swabs in vial transport medium  Result Value Ref Range Status   SARS Coronavirus 2 by RT PCR NEGATIVE NEGATIVE Final    Comment: (NOTE) SARS-CoV-2 target nucleic acids are NOT DETECTED.  The SARS-CoV-2 RNA is generally detectable in upper respiratory specimens during the acute phase of infection. The lowest concentration of SARS-CoV-2 viral copies this assay can detect is 138 copies/mL. A negative result does not preclude SARS-Cov-2 infection and should not be used as the sole basis for treatment or other patient management decisions. A negative result may occur with  improper specimen collection/handling, submission of specimen other than nasopharyngeal swab, presence of viral mutation(s) within the areas targeted by this assay, and inadequate number of viral copies(<138 copies/mL). A  negative result must be combined with clinical observations, patient history, and epidemiological information. The expected result is Negative.  Fact Sheet for Patients:  EntrepreneurPulse.com.au  Fact Sheet for Healthcare Providers:  IncredibleEmployment.be  This test is no t yet approved or cleared by the [place] FDA and  has been authorized for detection and/or diagnosis of SARS-CoV-2 by FDA under an Emergency Use Authorization (EUA). This EUA will remain  in effect (meaning this test can be used) for the duration of the COVID-19 declaration under Section 564(b)(1) of the Act, 21 U.S.C.section 360bbb-3(b)(1), unless the authorization is terminated  or revoked sooner.       Influenza A by PCR NEGATIVE NEGATIVE Final   Influenza B by PCR NEGATIVE NEGATIVE Final    Comment: (NOTE) The Xpert Xpress SARS-CoV-2/FLU/RSV plus assay is intended as an aid in the diagnosis of influenza from Nasopharyngeal swab specimens and should not be used as a sole basis for treatment. Nasal washings and aspirates are unacceptable  for Xpert Xpress SARS-CoV-2/FLU/RSV testing.  Fact Sheet for Patients: EntrepreneurPulse.com.au  Fact Sheet for Healthcare Providers: IncredibleEmployment.be  This test is not yet approved or cleared by the [place] FDA and has been authorized for detection and/or diagnosis of SARS-CoV-2 by FDA under an Emergency Use Authorization (EUA). This EUA will remain in effect (meaning this test can be used) for the duration of the COVID-19 declaration under Section 564(b)(1) of the Act, 21 U.S.C. section 360bbb-3(b)(1), unless the authorization is terminated or revoked.  Performed at [facility], [place] [address], [place], [place] [address]       Radiology Studies: CT ABDOMEN PELVIS W CONTRAST  Result Date: 11/04/2021 CLINICAL DATA:  Nausea, vomiting. EXAM: CT ABDOMEN  AND PELVIS WITH CONTRAST TECHNIQUE: Multidetector CT imaging of the abdomen and pelvis was performed using the standard protocol following bolus administration of intravenous contrast. CONTRAST:  97mL OMNIPAQUE IOHEXOL 350 MG/ML SOLN COMPARISON:  September 10, 2021. FINDINGS: Lower chest: No acute abnormality. Hepatobiliary: Status post cholecystectomy. Mild intrahepatic and extrahepatic biliary dilatation is noted which may be related to prior cholecystectomy. No definite hepatic abnormality is noted. Pancreas: Unremarkable. No pancreatic ductal dilatation or surrounding inflammatory changes. Spleen: Normal in size without focal abnormality. Adrenals/Urinary Tract: Adrenal glands are unremarkable. Kidneys are normal, without renal calculi, focal lesion, or hydronephrosis. Bladder is unremarkable. Stomach/Bowel: Nasogastric tube tip is seen in distal stomach. The appendix is not clearly visualized. No colonic dilatation is noted. Small bowel reanastomosis is again noted in the right lower quadrant. There is now noted multiple loops of small bowel in the central abdomen which are thick walled with surrounding inflammatory changes concerning for focal enteritis or other inflammation. Vascular/Lymphatic: Aortic atherosclerosis. No enlarged abdominal or pelvic lymph nodes. Reproductive: Status post hysterectomy. No adnexal masses. Other: No abdominal wall hernia or abnormality. No abdominopelvic ascites. Musculoskeletal: No acute or significant osseous findings. IMPRESSION: There is interval development of significant wall thickening and surrounding inflammatory changes involving multiple small bowel loops in the central portion of the abdomen concerning for severe enteritis or other inflammation. No abnormal bowel dilatation is noted. Mild intrahepatic and extrahepatic biliary dilatation is noted which may be related to prior cholecystectomy. Aortic Atherosclerosis (ICD10-I70.0). Electronically Signed   By: Marijo Conception M.D.   On: 11/04/2021 10:40    Scheduled Meds:  Chlorhexidine Gluconate Cloth  6 each Topical Daily   umeclidinium bromide  1 puff Inhalation Daily   And   fluticasone furoate-vilanterol  1 puff Inhalation Daily   heparin  5,000 Units Subcutaneous Q8H   pantoprazole (PROTONIX) IV  40 mg Intravenous Q12H   sodium chloride flush  10-40 mL Intracatheter Q12H   Continuous Infusions:  piperacillin-tazobactam (ZOSYN)  IV 3.375 g (11/05/21 1429)   promethazine (PHENERGAN) injection (IM or IVPB) 25 mg (11/05/21 0752)     LOS: 3 days   Marylu Lund, MD [facility] Pager On Amion  If 7PM-7AM, please contact night-coverage 11/05/2021, 2:42 PM

## 2021-11-05 NOTE — Progress Notes (Signed)
[facility] Progress Note     Subjective: CC:  Reports ongoing abdominal pain, nausea, and even emesis around NGT. Pain described as the same as yesterday. Did not tolerate PO contrast. Denies flatus or BM.  Objective: Vital signs in last 24 hours: Temp:  [98.2 F (36.8 C)-99.2 F (37.3 C)] 99.2 F (37.3 C) (12/16 0557) Pulse Rate:  [71-74] 74 (12/16 0557) Resp:  [17-18] 18 (12/16 0557) BP: (122-152)/(63-78) 135/78 (12/16 0557) SpO2:  [95 %-100 %] 95 % (12/16 0824) Last BM Date: 11/01/21  Intake/Output from previous day: 12/15 0701 - 12/16 0700 In: 302 [P.O.:60; IV Piggyback:242] Out: 2850 [Urine:2800; Emesis/NG output:50] Intake/Output this shift: No intake/output data recorded.  PE: Gen:  Alert, NAD, pleasant Card:  Regular rate and rhythm, pedal pulses 2+ BL Pulm:  Normal effort, clear to auscultation bilaterally Abd: Soft, mild global tenderness worse over umbilicus, no peritonitis, NG in place with small amt bilious fluid in cannister  Skin: warm and dry, no rashes  Psych: A&Ox3   Lab Results:  Recent Labs    11/03/21 0525 11/05/21 0401  WBC 7.5 7.0  HGB 12.7 12.2  HCT 39.6 38.2  PLT 263 242   BMET Recent Labs    11/04/21 0333 11/05/21 0401  NA 142 139  K 3.9 4.0  CL 106 103  CO2 30 26  GLUCOSE 81 68*  BUN 10 11  CREATININE 0.83 0.84  CALCIUM 8.4* 8.8*   PT/INR No results for input(s): LABPROT, INR in the last 72 hours. CMP     Component Value Date/Time   NA 139 11/05/2021 0401   K 4.0 11/05/2021 0401   CL 103 11/05/2021 0401   CO2 26 11/05/2021 0401   GLUCOSE 68 (L) 11/05/2021 0401   BUN 11 11/05/2021 0401   CREATININE 0.84 11/05/2021 0401   CALCIUM 8.8 (L) 11/05/2021 0401   PROT 6.7 11/05/2021 0401   ALBUMIN 3.5 11/05/2021 0401   AST 19 11/05/2021 0401   ALT 16 11/05/2021 0401   ALKPHOS 77 11/05/2021 0401   BILITOT 1.0 11/05/2021 0401   GFRNONAA >60 11/05/2021 0401   GFRAA >60 08/15/2018 1102   Lipase     Component  Value Date/Time   LIPASE 21 09/10/2021 1034       Studies/Results: CT ABDOMEN PELVIS W CONTRAST  Result Date: 11/04/2021 CLINICAL DATA:  Nausea, vomiting. EXAM: CT ABDOMEN AND PELVIS WITH CONTRAST TECHNIQUE: Multidetector CT imaging of the abdomen and pelvis was performed using the standard protocol following bolus administration of intravenous contrast. CONTRAST:  70mL OMNIPAQUE IOHEXOL 350 MG/ML SOLN COMPARISON:  September 10, 2021. FINDINGS: Lower chest: No acute abnormality. Hepatobiliary: Status post cholecystectomy. Mild intrahepatic and extrahepatic biliary dilatation is noted which may be related to prior cholecystectomy. No definite hepatic abnormality is noted. Pancreas: Unremarkable. No pancreatic ductal dilatation or surrounding inflammatory changes. Spleen: Normal in size without focal abnormality. Adrenals/Urinary Tract: Adrenal glands are unremarkable. Kidneys are normal, without renal calculi, focal lesion, or hydronephrosis. Bladder is unremarkable. Stomach/Bowel: Nasogastric tube tip is seen in distal stomach. The appendix is not clearly visualized. No colonic dilatation is noted. Small bowel reanastomosis is again noted in the right lower quadrant. There is now noted multiple loops of small bowel in the central abdomen which are thick walled with surrounding inflammatory changes concerning for focal enteritis or other inflammation. Vascular/Lymphatic: Aortic atherosclerosis. No enlarged abdominal or pelvic lymph nodes. Reproductive: Status post hysterectomy. No adnexal masses. Other: No abdominal wall hernia or abnormality. No abdominopelvic ascites.  Musculoskeletal: No acute or significant osseous findings. IMPRESSION: There is interval development of significant wall thickening and surrounding inflammatory changes involving multiple small bowel loops in the central portion of the abdomen concerning for severe enteritis or other inflammation. No abnormal bowel dilatation is noted. Mild  intrahepatic and extrahepatic biliary dilatation is noted which may be related to prior cholecystectomy. Aortic Atherosclerosis (ICD10-I70.0). Electronically Signed   By: Marijo Conception M.D.   On: 11/04/2021 10:40   DG Abd Portable 1V  Result Date: 11/03/2021 CLINICAL DATA:  NG tube placement. EXAM: PORTABLE ABDOMEN - 1 VIEW COMPARISON:  Earlier film, same date. FINDINGS: The NG tube tip is in the antropyloric region of the stomach. Unremarkable bowel gas pattern. IMPRESSION: NG tube tip is in the antropyloric region of the stomach. Electronically Signed   By: Marijo Sanes M.D.   On: 11/03/2021 13:35    Anti-infectives: Anti-infectives (From admission, onward)    Start     Dose/Rate Route Frequency Ordered Stop   11/04/21 1630  piperacillin-tazobactam (ZOSYN) IVPB 3.375 g        3.375 g 12.5 mL/hr over 240 Minutes Intravenous Every 8 hours 11/04/21 1536          Assessment/Plan  Small bowel enteritis  - Hx of Abdominal Hysterectomy, Open Cholecystectomy, Ex lap for SBO x2 in the 1980's, and Exploratory laparotomy for SBO in [facility] 2017 - no mechanical obstruction, may have reactive ileus due to enteritis. Now having some flatus. Minimal output from NGT.  - ok to D/C NGT and gradually advance diet per medical service.  - no acute surgical needs. General surgery will sign off. Please call as needed.    FEN - NPO, IVF VTE - SCDs, subq heparin ID - None   - Per [facility] -  CHF (last EF 65-70% on Echo 2018) COPD Chronic Pain Syndrome (Oxy 15mg  QID) Hep C s/p Epclusa x 12 weeks HTN HLD TIA/CVA on Plavix (last dose 2 weeks ago)  Thyroid disease     LOS: 3 days    Obie Dredge, [facility] Please see Amion for pager number during day hours 7:00am-4:30pm

## 2021-11-06 ENCOUNTER — Other Ambulatory Visit: Payer: Self-pay

## 2021-11-06 NOTE — Progress Notes (Signed)
PROGRESS NOTE    Caitlyn Pacheco  NID:[id number] DOB: 06/08/1956 DOA: 11/02/2021 PCP: Harvie Junior, MD    Brief Narrative:  65 y.o. female with medical history significant for COPD, recurrent small bowel obstruction, history of CVA, HTN, HLD, hepatitis C s/p Epclusa, chronic pain syndrome, anxiety who is admitted with recurrent small bowel obstruction.  Assessment & Plan:   Principal Problem:   Small bowel obstruction (HCC) Active Problems:   HTN (hypertension)   Chronic pain syndrome   COPD (chronic obstructive pulmonary disease) (HCC)   History of transient ischemic attack (TIA)  Small bowel obstruction with enteritis SB inflammation: -Initially had required NG placement, since removed by General Surgery -CT findings of severe wall thickening involving SB concerning for enteritis or inflammation, now on zosyn -General Surgery has since signed off -Passing flatus today -Pt requesting advancing diet. Has been tolerating popsicles. Will start full liquid diet   COPD: Stable.  Continue Trelegy and albuterol as needed. Remains without wheezing   Hypertension: Oral antihypertensives on hold while NPO.   Chronic pain syndrome/anxiety: Home oxycodone, Lyrica, Bentyl on hold while NPO.  On IV Dilaudid as needed.  On IV diazepam 5 mg TID prn anxiety Pt currently understandably upset over unexpected death in the family  Have ask chaplain to visit pt   History of CVA: Would resume home aspirin, Plavix when able and when pt is able to reliably tolerate PO   GERD: Currently on BID PPI   DVT prophylaxis: Heparin subq Code Status: Full Family Communication: Pt in room, family not at bedside  Status is: Inpatient  Remains inpatient appropriate because: Acuity of illness, needing NPO status for active SBO    Consultants:  General Surgery  Procedures:    Antimicrobials: Anti-infectives (From admission, onward)    Start     Dose/Rate Route Frequency Ordered Stop    11/04/21 1630  piperacillin-tazobactam (ZOSYN) IVPB 3.375 g        3.375 g 12.5 mL/hr over 240 Minutes Intravenous Every 8 hours 11/04/21 1536         Subjective: Reports some residual abd discomfort but is passing flatus and is eager to begin advancing diet. Tolerating popsicles  Objective: Vitals:   11/05/21 2037 11/06/21 0550 11/06/21 0810 11/06/21 1319  BP: 132/77 130/65  (!) 151/62  Pulse: (!) 56 (!) 56  64  Resp: 18 18  18   Temp: 98.2 F (36.8 C) 98.2 F (36.8 C)  98.8 F (37.1 C)  TempSrc: Oral Oral  Oral  SpO2: 100% 97% 96% 100%    Intake/Output Summary (Last 24 hours) at 11/06/2021 1521 Last data filed at 11/06/2021 1400 Gross per 24 hour  Intake 623.7 ml  Output 1400 ml  Net -776.3 ml    There were no vitals filed for this visit.  Examination: General exam: Conversant, in no acute distress Respiratory system: normal chest rise, clear, no audible wheezing Cardiovascular system: regular rhythm, s1-s2 Gastrointestinal system: Nondistended, nontender, pos BS Central nervous system: No seizures, no tremors Extremities: No cyanosis, no joint deformities Skin: No rashes, no pallor Psychiatry: Affect normal // no auditory hallucinations   Data Reviewed: I have personally reviewed following labs and imaging studies  CBC: Recent Labs  Lab 11/02/21 1927 11/03/21 0525 11/05/21 0401  WBC 9.8 7.5 7.0  NEUTROABS 7.6  --   --   HGB 13.9 12.7 12.2  HCT 42.5 39.6 38.2  MCV 72.6* 74.9* 74.3*  PLT 284 263 144    Basic Metabolic Panel:  Recent Labs  Lab 11/02/21 1927 11/03/21 0525 11/04/21 0333 11/05/21 0401  NA 141 139 142 139  K 3.7 3.4* 3.9 4.0  CL 105 103 106 103  CO2 28 28 30 26   GLUCOSE 111* 102* 81 68*  BUN 13 10 10 11   CREATININE 0.62 0.65 0.83 0.84  CALCIUM 9.1 8.5* 8.4* 8.8*  MG  --   --  1.8  --     GFR: CrCl cannot be calculated (Unknown ideal weight.). Liver Function Tests: Recent Labs  Lab 11/02/21 1927 11/04/21 0333 11/05/21 0401   AST 16 13* 19  ALT 19 14 16   ALKPHOS 84 69 77  BILITOT 0.6 0.8 1.0  PROT 7.7 5.9* 6.7  ALBUMIN 4.1 3.1* 3.5    No results for input(s): LIPASE, AMYLASE in the last 168 hours. No results for input(s): AMMONIA in the last 168 hours. Coagulation Profile: No results for input(s): INR, PROTIME in the last 168 hours. Cardiac Enzymes: No results for input(s): CKTOTAL, CKMB, CKMBINDEX, TROPONINI in the last 168 hours. BNP (last 3 results) No results for input(s): PROBNP in the last 8760 hours. HbA1C: No results for input(s): HGBA1C in the last 72 hours. CBG: No results for input(s): GLUCAP in the last 168 hours. Lipid Profile: No results for input(s): CHOL, HDL, LDLCALC, TRIG, CHOLHDL, LDLDIRECT in the last 72 hours. Thyroid Function Tests: No results for input(s): TSH, T4TOTAL, FREET4, T3FREE, THYROIDAB in the last 72 hours. Anemia Panel: No results for input(s): VITAMINB12, FOLATE, FERRITIN, TIBC, IRON, RETICCTPCT in the last 72 hours. Sepsis Labs: Recent Labs  Lab 11/02/21 1928  LATICACIDVEN 1.7     Recent Results (from the past 240 hour(s))  Resp Panel by RT-PCR (Flu A&B, Covid) Nasopharyngeal Swab     Status: None   Collection Time: 11/02/21  9:56 PM   Specimen: Nasopharyngeal Swab; Nasopharyngeal(NP) swabs in vial transport medium  Result Value Ref Range Status   SARS Coronavirus 2 by RT PCR NEGATIVE NEGATIVE Final    Comment: (NOTE) SARS-CoV-2 target nucleic acids are NOT DETECTED.  The SARS-CoV-2 RNA is generally detectable in upper respiratory specimens during the acute phase of infection. The lowest concentration of SARS-CoV-2 viral copies this assay can detect is 138 copies/mL. A negative result does not preclude SARS-Cov-2 infection and should not be used as the sole basis for treatment or other patient management decisions. A negative result may occur with  improper specimen collection/handling, submission of specimen other than nasopharyngeal swab, presence  of viral mutation(s) within the areas targeted by this assay, and inadequate number of viral copies(<138 copies/mL). A negative result must be combined with clinical observations, patient history, and epidemiological information. The expected result is Negative.  Fact Sheet for Patients:  EntrepreneurPulse.com.au  Fact Sheet for Healthcare Providers:  IncredibleEmployment.be  This test is no t yet approved or cleared by the [place] FDA and  has been authorized for detection and/or diagnosis of SARS-CoV-2 by FDA under an Emergency Use Authorization (EUA). This EUA will remain  in effect (meaning this test can be used) for the duration of the COVID-19 declaration under Section 564(b)(1) of the Act, 21 U.S.C.section 360bbb-3(b)(1), unless the authorization is terminated  or revoked sooner.       Influenza A by PCR NEGATIVE NEGATIVE Final   Influenza B by PCR NEGATIVE NEGATIVE Final    Comment: (NOTE) The Xpert Xpress SARS-CoV-2/FLU/RSV plus assay is intended as an aid in the diagnosis of influenza from Nasopharyngeal swab specimens and should not be  used as a sole basis for treatment. Nasal washings and aspirates are unacceptable for Xpert Xpress SARS-CoV-2/FLU/RSV testing.  Fact Sheet for Patients: EntrepreneurPulse.com.au  Fact Sheet for Healthcare Providers: IncredibleEmployment.be  This test is not yet approved or cleared by the [place] FDA and has been authorized for detection and/or diagnosis of SARS-CoV-2 by FDA under an Emergency Use Authorization (EUA). This EUA will remain in effect (meaning this test can be used) for the duration of the COVID-19 declaration under Section 564(b)(1) of the Act, 21 U.S.C. section 360bbb-3(b)(1), unless the authorization is terminated or revoked.  Performed at [facility], [place] [address], [place], [place] [address]        Radiology Studies: No results found.  Scheduled Meds:  Chlorhexidine Gluconate Cloth  6 each Topical Daily   umeclidinium bromide  1 puff Inhalation Daily   And   fluticasone furoate-vilanterol  1 puff Inhalation Daily   heparin  5,000 Units Subcutaneous Q8H   pantoprazole (PROTONIX) IV  40 mg Intravenous Q12H   sodium chloride flush  10-40 mL Intracatheter Q12H   Continuous Infusions:  piperacillin-tazobactam (ZOSYN)  IV 3.375 g (11/06/21 1413)   promethazine (PHENERGAN) injection (IM or IVPB) 25 mg (11/05/21 2100)     LOS: 4 days   Marylu Lund, MD [facility] Pager On Amion  If 7PM-7AM, please contact night-coverage 11/06/2021, 3:21 PM

## 2021-11-07 LAB — COMPREHENSIVE METABOLIC PANEL
ALT: 19 U/L (ref 0–44)
AST: 22 U/L (ref 15–41)
Albumin: 3.7 g/dL (ref 3.5–5.0)
Alkaline Phosphatase: 67 U/L (ref 38–126)
Anion gap: 7 (ref 5–15)
BUN: 6 mg/dL — ABNORMAL LOW (ref 8–23)
CO2: 25 mmol/L (ref 22–32)
Calcium: 8.5 mg/dL — ABNORMAL LOW (ref 8.9–10.3)
Chloride: 105 mmol/L (ref 98–111)
Creatinine, Ser: 1.04 mg/dL — ABNORMAL HIGH (ref 0.44–1.00)
GFR, Estimated: >60 mL/min (ref >60)
Glucose, Bld: 115 mg/dL — ABNORMAL HIGH (ref 70–99)
Potassium: 3.6 mmol/L (ref 3.5–5.1)
Sodium: 137 mmol/L (ref 135–145)
Total Bilirubin: 0.5 mg/dL (ref 0.3–1.2)
Total Protein: 6.7 g/dL (ref 6.5–8.1)

## 2021-11-07 LAB — CBC
HCT: 38.1 % (ref 36.0–46.0)
Hemoglobin: 12.2 g/dL (ref 12.0–15.0)
MCH: 23.8 pg — ABNORMAL LOW (ref 26.0–34.0)
MCHC: 32 g/dL (ref 30.0–36.0)
MCV: 74.4 fL — ABNORMAL LOW (ref 80.0–100.0)
Platelets: 252 10*3/uL (ref 150–400)
RBC: 5.12 MIL/uL — ABNORMAL HIGH (ref 3.87–5.11)
RDW: 15 % (ref 11.5–15.5)
WBC: 5.3 10*3/uL (ref 4.0–10.5)
nRBC: 0 % (ref 0.0–0.2)

## 2021-11-07 MED ORDER — NYSTATIN 100000 UNIT/GM EX CREA
TOPICAL_CREAM | Freq: Three times a day (TID) | CUTANEOUS | Status: DC
  Administered 2021-11-07 – 2021-11-08 (×2): 1 via TOPICAL
  Filled 2021-11-07: qty 15

## 2021-11-07 MED ORDER — OXYCODONE HCL 5 MG PO TABS
15.0000 mg | ORAL_TABLET | Freq: Four times a day (QID) | ORAL | Status: DC | PRN
  Administered 2021-11-07 – 2021-11-08 (×4): 15 mg via ORAL
  Filled 2021-11-07 (×4): qty 3

## 2021-11-07 MED ORDER — CARBAMIDE PEROXIDE 6.5 % OT SOLN
5.0000 [drp] | Freq: Two times a day (BID) | OTIC | Status: DC | PRN
  Filled 2021-11-07: qty 15

## 2021-11-07 MED ORDER — OXYCODONE HCL 5 MG PO TABS
5.0000 mg | ORAL_TABLET | ORAL | Status: DC | PRN
  Administered 2021-11-07: 14:00:00 5 mg via ORAL
  Filled 2021-11-07: qty 1

## 2021-11-07 MED ORDER — HYDROMORPHONE HCL 1 MG/ML IJ SOLN
1.0000 mg | INTRAMUSCULAR | Status: DC | PRN
  Administered 2021-11-07 – 2021-11-08 (×5): 1 mg via INTRAVENOUS
  Filled 2021-11-07 (×6): qty 1

## 2021-11-07 NOTE — Progress Notes (Signed)
°   11/07/21 1455  Clinical Encounter Type  Visited With Patient not available  Visit Type Initial  Referral From Nurse  Consult/Referral To Chaplain   Chaplain received page earlier in the day for Pt requesting prayer. At the time, Pt asked for chaplain to call back after an hour. Chaplain called back as requested and there was no answer. Please page again if Pt would like to talk today. Otherwise, a chaplain will follow-up tomorrow.  This note was prepared by Marijo File, MDiv. Chaplain remains available as needed through the on-call pager: [phone].

## 2021-11-07 NOTE — Progress Notes (Signed)
PROGRESS NOTE    Caitlyn Pacheco  BJS:[id number] DOB: February 14, 1956 DOA: 11/02/2021 PCP: Harvie Junior, MD    Brief Narrative:  65 y.o. female with medical history significant for COPD, recurrent small bowel obstruction, history of CVA, HTN, HLD, hepatitis C s/p Epclusa, chronic pain syndrome, anxiety who is admitted with recurrent small bowel obstruction.  Assessment & Plan:   Principal Problem:   Small bowel obstruction (HCC) Active Problems:   HTN (hypertension)   Chronic pain syndrome   COPD (chronic obstructive pulmonary disease) (HCC)   History of transient ischemic attack (TIA)  Small bowel obstruction with enteritis SB inflammation: -Initially had required NG placement, since removed by General Surgery -CT findings of severe wall thickening involving SB concerning for enteritis or inflammation, now on zosyn -General Surgery has since signed off -reports passing flatus and stool -Pt request further advancing diet. Will advance to soft today -Still with abd pain. Will resume home dosed oxycodone   COPD: Stable.  Continue Trelegy and albuterol as needed. No wheezing    Hypertension: Oral antihypertensives on hold while NPO.   Chronic pain syndrome/anxiety: Home oxycodone, Lyrica, Bentyl on hold while NPO.  On IV Dilaudid as needed.  On IV diazepam 5 mg TID prn anxiety Pt currently understandably upset over unexpected death in the family  Appreciate assistance by chaplain service   History of CVA: Would resume home aspirin, Plavix when able and when pt is able to reliably tolerate PO   GERD: Currently on BID PPI   DVT prophylaxis: Heparin subq Code Status: Full Family Communication: Pt in room, family not at bedside  Status is: Inpatient  Remains inpatient appropriate because: Acuity of illness, needing NPO status for active SBO    Consultants:  General Surgery  Procedures:    Antimicrobials: Anti-infectives (From admission, onward)    Start      Dose/Rate Route Frequency Ordered Stop   11/04/21 1630  piperacillin-tazobactam (ZOSYN) IVPB 3.375 g        3.375 g 12.5 mL/hr over 240 Minutes Intravenous Every 8 hours 11/04/21 1536         Subjective: Still having abd pains but is eager to advance diet  Objective: Vitals:   11/06/21 1319 11/06/21 2156 11/07/21 0624 11/07/21 1228  BP: (!) 151/62 137/74 138/66 (!) 144/72  Pulse: 64 60 (!) 49 (!) 58  Resp: 18 16 18 16   Temp: 98.8 F (37.1 C) 98 F (36.7 C) 98 F (36.7 C) 98.3 F (36.8 C)  TempSrc: Oral Oral Oral Oral  SpO2: 100% 100% 100% 100%    Intake/Output Summary (Last 24 hours) at 11/07/2021 1749 Last data filed at 11/07/2021 1700 Gross per 24 hour  Intake 878.22 ml  Output 2100 ml  Net -1221.78 ml    There were no vitals filed for this visit.  Examination: General exam: Awake, laying in bed, in nad Respiratory system: Normal respiratory effort, no wheezing Cardiovascular system: regular rate, s1, s2 Gastrointestinal system: Soft, nondistended, positive BS Central nervous system: CN2-12 grossly intact, strength intact Extremities: Perfused, no clubbing Skin: Normal skin turgor, no notable skin lesions seen Psychiatry: Mood normal // no visual hallucinations   Data Reviewed: I have personally reviewed following labs and imaging studies  CBC: Recent Labs  Lab 11/02/21 1927 11/03/21 0525 11/05/21 0401 11/07/21 0401  WBC 9.8 7.5 7.0 5.3  NEUTROABS 7.6  --   --   --   HGB 13.9 12.7 12.2 12.2  HCT 42.5 39.6 38.2 38.1  MCV  72.6* 74.9* 74.3* 74.4*  PLT 284 263 242 681    Basic Metabolic Panel: Recent Labs  Lab 11/02/21 1927 11/03/21 0525 11/04/21 0333 11/05/21 0401 11/07/21 0401  NA 141 139 142 139 137  K 3.7 3.4* 3.9 4.0 3.6  CL 105 103 106 103 105  CO2 28 28 30 26 25   GLUCOSE 111* 102* 81 68* 115*  BUN 13 10 10 11  6*  CREATININE 0.62 0.65 0.83 0.84 1.04*  CALCIUM 9.1 8.5* 8.4* 8.8* 8.5*  MG  --   --  1.8  --   --     GFR: CrCl cannot  be calculated (Unknown ideal weight.). Liver Function Tests: Recent Labs  Lab 11/02/21 1927 11/04/21 0333 11/05/21 0401 11/07/21 0401  AST 16 13* 19 22  ALT 19 14 16 19   ALKPHOS 84 69 77 67  BILITOT 0.6 0.8 1.0 0.5  PROT 7.7 5.9* 6.7 6.7  ALBUMIN 4.1 3.1* 3.5 3.7    No results for input(s): LIPASE, AMYLASE in the last 168 hours. No results for input(s): AMMONIA in the last 168 hours. Coagulation Profile: No results for input(s): INR, PROTIME in the last 168 hours. Cardiac Enzymes: No results for input(s): CKTOTAL, CKMB, CKMBINDEX, TROPONINI in the last 168 hours. BNP (last 3 results) No results for input(s): PROBNP in the last 8760 hours. HbA1C: No results for input(s): HGBA1C in the last 72 hours. CBG: No results for input(s): GLUCAP in the last 168 hours. Lipid Profile: No results for input(s): CHOL, HDL, LDLCALC, TRIG, CHOLHDL, LDLDIRECT in the last 72 hours. Thyroid Function Tests: No results for input(s): TSH, T4TOTAL, FREET4, T3FREE, THYROIDAB in the last 72 hours. Anemia Panel: No results for input(s): VITAMINB12, FOLATE, FERRITIN, TIBC, IRON, RETICCTPCT in the last 72 hours. Sepsis Labs: Recent Labs  Lab 11/02/21 1928  LATICACIDVEN 1.7     Recent Results (from the past 240 hour(s))  Resp Panel by RT-PCR (Flu A&B, Covid) Nasopharyngeal Swab     Status: None   Collection Time: 11/02/21  9:56 PM   Specimen: Nasopharyngeal Swab; Nasopharyngeal(NP) swabs in vial transport medium  Result Value Ref Range Status   SARS Coronavirus 2 by RT PCR NEGATIVE NEGATIVE Final    Comment: (NOTE) SARS-CoV-2 target nucleic acids are NOT DETECTED.  The SARS-CoV-2 RNA is generally detectable in upper respiratory specimens during the acute phase of infection. The lowest concentration of SARS-CoV-2 viral copies this assay can detect is 138 copies/mL. A negative result does not preclude SARS-Cov-2 infection and should not be used as the sole basis for treatment or other patient  management decisions. A negative result may occur with  improper specimen collection/handling, submission of specimen other than nasopharyngeal swab, presence of viral mutation(s) within the areas targeted by this assay, and inadequate number of viral copies(<138 copies/mL). A negative result must be combined with clinical observations, patient history, and epidemiological information. The expected result is Negative.  Fact Sheet for Patients:  EntrepreneurPulse.com.au  Fact Sheet for Healthcare Providers:  IncredibleEmployment.be  This test is no t yet approved or cleared by the [place] FDA and  has been authorized for detection and/or diagnosis of SARS-CoV-2 by FDA under an Emergency Use Authorization (EUA). This EUA will remain  in effect (meaning this test can be used) for the duration of the COVID-19 declaration under Section 564(b)(1) of the Act, 21 U.S.C.section 360bbb-3(b)(1), unless the authorization is terminated  or revoked sooner.       Influenza A by PCR NEGATIVE NEGATIVE Final  Influenza B by PCR NEGATIVE NEGATIVE Final    Comment: (NOTE) The Xpert Xpress SARS-CoV-2/FLU/RSV plus assay is intended as an aid in the diagnosis of influenza from Nasopharyngeal swab specimens and should not be used as a sole basis for treatment. Nasal washings and aspirates are unacceptable for Xpert Xpress SARS-CoV-2/FLU/RSV testing.  Fact Sheet for Patients: EntrepreneurPulse.com.au  Fact Sheet for Healthcare Providers: IncredibleEmployment.be  This test is not yet approved or cleared by the [place] FDA and has been authorized for detection and/or diagnosis of SARS-CoV-2 by FDA under an Emergency Use Authorization (EUA). This EUA will remain in effect (meaning this test can be used) for the duration of the COVID-19 declaration under Section 564(b)(1) of the Act, 21 U.S.C. section 360bbb-3(b)(1),  unless the authorization is terminated or revoked.  Performed at [facility], [place] [address], [place], [place] [address]       Radiology Studies: No results found.  Scheduled Meds:  Chlorhexidine Gluconate Cloth  6 each Topical Daily   umeclidinium bromide  1 puff Inhalation Daily   And   fluticasone furoate-vilanterol  1 puff Inhalation Daily   heparin  5,000 Units Subcutaneous Q8H   nystatin cream   Topical TID   pantoprazole (PROTONIX) IV  40 mg Intravenous Q12H   sodium chloride flush  10-40 mL Intracatheter Q12H   Continuous Infusions:  piperacillin-tazobactam (ZOSYN)  IV 3.375 g (11/07/21 1420)   promethazine (PHENERGAN) injection (IM or IVPB) 25 mg (11/05/21 2100)     LOS: 5 days   Marylu Lund, MD [facility] Pager On Amion  If 7PM-7AM, please contact night-coverage 11/07/2021, 5:49 PM

## 2021-11-08 LAB — COMPREHENSIVE METABOLIC PANEL
ALT: 21 U/L (ref 0–44)
AST: 21 U/L (ref 15–41)
Albumin: 3.4 g/dL — ABNORMAL LOW (ref 3.5–5.0)
Alkaline Phosphatase: 62 U/L (ref 38–126)
Anion gap: 6 (ref 5–15)
BUN: 7 mg/dL — ABNORMAL LOW (ref 8–23)
CO2: 24 mmol/L (ref 22–32)
Calcium: 8.5 mg/dL — ABNORMAL LOW (ref 8.9–10.3)
Chloride: 109 mmol/L (ref 98–111)
Creatinine, Ser: 1.01 mg/dL — ABNORMAL HIGH (ref 0.44–1.00)
GFR, Estimated: >60 mL/min (ref >60)
Glucose, Bld: 99 mg/dL (ref 70–99)
Potassium: 3.9 mmol/L (ref 3.5–5.1)
Sodium: 139 mmol/L (ref 135–145)
Total Bilirubin: 0.4 mg/dL (ref 0.3–1.2)
Total Protein: 6.3 g/dL — ABNORMAL LOW (ref 6.5–8.1)

## 2021-11-08 LAB — CBC
HCT: 36.4 % (ref 36.0–46.0)
Hemoglobin: 11.5 g/dL — ABNORMAL LOW (ref 12.0–15.0)
MCH: 23.7 pg — ABNORMAL LOW (ref 26.0–34.0)
MCHC: 31.6 g/dL (ref 30.0–36.0)
MCV: 74.9 fL — ABNORMAL LOW (ref 80.0–100.0)
Platelets: 238 10*3/uL (ref 150–400)
RBC: 4.86 MIL/uL (ref 3.87–5.11)
RDW: 15.1 % (ref 11.5–15.5)
WBC: 4.3 10*3/uL (ref 4.0–10.5)
nRBC: 0 % (ref 0.0–0.2)

## 2021-11-08 MED ORDER — AMOXICILLIN-POT CLAVULANATE 875-125 MG PO TABS: 1.0000 | ORAL_TABLET | Freq: Two times a day (BID) | ORAL | 0 refills | Status: AC

## 2021-11-08 MED ORDER — HEPARIN SOD (PORK) LOCK FLUSH 100 UNIT/ML IV SOLN
500.0000 [IU] | INTRAVENOUS | Status: AC | PRN
  Administered 2021-11-08: 17:00:00 500 [IU]
  Filled 2021-11-08: qty 5

## 2021-11-08 MED ORDER — PANTOPRAZOLE SODIUM 40 MG PO TBEC: 40.0000 mg | DELAYED_RELEASE_TABLET | Freq: Every day | ORAL | 0 refills | Status: DC

## 2021-11-08 NOTE — Progress Notes (Signed)
Chaplain engaged in an initial visit with Caitlyn Pacheco.  Caitlyn Pacheco shared about her healthcare journey with her intestines and bowels.  She noted how many surgeries she has had, and how hard it has been on her body.  Though she has undergone so much in her body and health, Caitlyn Pacheco depends on her faith.  She trusts and believes in God to get her through, as well as trusts God's plan for her life.  Though she expressed some unknowns in her body and her health, Caitlyn Pacheco thinks deeply about the ways God has been with her.   Caitlyn Pacheco also shared about the significant loses she has had within the last couple of years.  She had an identical twin sister, Caitlyn Pacheco, who passed about three years ago.  Her mother had just passed ten months before her sister.  She also lost another sister in August.  Recently, she has been holding the loss of her niece who passed from cancer.  Caitlyn Pacheco has really leaned on her faith to get her through the hardest times of her life.  She shared that her relationship with God is why she continues to keep going.   Chaplain provided the space for Caitlyn Pacheco to talk about her spirituality and grief.  Chaplain also was able to affirm and join in with Caitlyn Pacheco as she shared about her faith.  Chaplain provided presence, listening, and support.  Caitlyn Pacheco also voiced how important prayer is for her.  She values joining in with others to pray.  Chaplain and Caitlyn Pacheco prayed together.     11/08/21 1100  Clinical Encounter Type  Visited With Patient  Visit Type Spiritual support  Spiritual Encounters  Spiritual Needs Prayer;Grief support  Stress Factors  Patient Stress Factors Health changes;Major life changes

## 2021-11-08 NOTE — Discharge Summary (Signed)
Physician Discharge Summary  Caitlyn Pacheco LPF:[id number] DOB: 27-Nov-1955 DOA: 11/02/2021  PCP: Harvie Junior, MD  Admit date: 11/02/2021 Discharge date: 11/08/2021  Admitted From: Home Disposition:  Home  Recommendations for Outpatient Follow-up:  Follow up with PCP in 1-2 weeks  Discharge Condition:Improved CODE STATUS:Full Diet recommendation: Soft, advance as tolerated   Brief/Interim Summary: 65 y.o. female with medical history significant for COPD, recurrent small bowel obstruction, history of CVA, HTN, HLD, hepatitis C s/p Epclusa, chronic pain syndrome, anxiety who is admitted with recurrent small bowel obstruction.  Discharge Diagnoses:  Principal Problem:   Small bowel obstruction ([place]) Active Problems:   HTN (hypertension)   Chronic pain syndrome   COPD (chronic obstructive pulmonary disease) (HCC)   History of transient ischemic attack (TIA)  Principal Problem:   Small bowel obstruction ([facility]) Active Problems:   HTN (hypertension)   Chronic pain syndrome   COPD (chronic obstructive pulmonary disease) (HCC)   History of transient ischemic attack (TIA)   Small bowel obstruction with enteritis SB inflammation: -Initially had required NG placement, since removed by General Surgery -CT findings of severe wall thickening involving SB concerning for enteritis or inflammation, was continued on zosyn -General Surgery has since signed off -Pt now passing flatus and stools -Successfully advanced diet to soft diet -Still with abd pain, likely chronic. Cont home dosed oxycodone   COPD: Stable.  Continue Trelegy and albuterol as needed. No wheezing    Hypertension: Oral antihypertensives on hold while NPO.   Chronic pain syndrome/anxiety: Continue home narcotics as tolerated   History of CVA: On ASA at home -Cont home regimen on d/c -Cont plavix per home regimen   GERD: Currently on BID PPI    Discharge Instructions   Allergies as of 11/08/2021        Reactions   Morphine And Related Other (See Comments), Hives, Itching   Per her doctor Per her doctor   Acetaminophen Other (See Comments)   Liver enlargement- hep C   Ibuprofen    Per her doctor   Nsaids    Per her doctor   Prednisone Other (See Comments)   Per her doctor   Statins Other (See Comments)   Affected liver enzymes Affected liver enzymes        Medication List     TAKE these medications    albuterol 108 (90 Base) MCG/ACT inhaler Commonly known as: VENTOLIN HFA Inhale 2 puffs into the lungs every 6 (six) hours as needed for wheezing or shortness of breath.   albuterol 1.25 MG/3ML nebulizer solution Commonly known as: ACCUNEB Take 3 mLs by nebulization 3 (three) times daily as needed for wheezing or shortness of breath.   amoxicillin-clavulanate 875-125 MG tablet Commonly known as: Augmentin Take 1 tablet by mouth 2 (two) times daily for 3 days.   aspirin 81 MG chewable tablet Chew 81 mg by mouth daily.   bismuth subsalicylate 532 DJ/24QA suspension Commonly known as: PEPTO BISMOL Take 30 mLs by mouth every 6 (six) hours as needed for indigestion.   budesonide-formoterol 160-4.5 MCG/ACT inhaler Commonly known as: SYMBICORT Inhale 2 puffs into the lungs 2 (two) times daily.   carbamide peroxide 6.5 % OTIC solution Commonly known as: DEBROX 5 drops 2 (two) times daily as needed (ear pain).   clopidogrel 75 MG tablet Commonly known as: PLAVIX Take 75 mg by mouth daily.   diazepam 10 MG tablet Commonly known as: VALIUM Take 10 mg by mouth 3 (three) times daily as needed for  anxiety.   dicyclomine 10 MG capsule Commonly known as: BENTYL Take 1 capsule (10 mg total) by mouth 2 (two) times daily.   ezetimibe 10 MG tablet Commonly known as: ZETIA Take 10 mg by mouth daily.   famotidine 40 MG tablet Commonly known as: PEPCID Take 40 mg by mouth 2 (two) times daily.   fenofibrate 160 MG tablet Take 160 mg by mouth daily.   fluticasone  50 MCG/ACT nasal spray Commonly known as: FLONASE Place 2 sprays into both nostrils daily as needed for allergies or rhinitis.   Fluticasone-Umeclidin-Vilant 100-62.5-25 MCG/ACT Aepb Take 1 puff by mouth daily.   furosemide 40 MG tablet Commonly known as: LASIX Take 0.5 tablets (20 mg total) by mouth daily. What changed:  how much to take when to take this reasons to take this   gemfibrozil 600 MG tablet Commonly known as: LOPID Take 600 mg by mouth 2 (two) times daily.   lubiprostone 24 MCG capsule Commonly known as: AMITIZA Take 24 mcg by mouth 2 (two) times daily with a meal.   naloxone 4 MG/0.1ML Liqd nasal spray kit Commonly known as: NARCAN Place 1 spray into the nose once as needed (over dose).   Olmesartan-amLODIPine-HCTZ 40-10-25 MG Tabs Take 1 tablet by mouth daily.   oxyCODONE 15 MG immediate release tablet Commonly known as: ROXICODONE Take 15 mg by mouth 4 (four) times daily as needed for pain.   pantoprazole 40 MG tablet Commonly known as: PROTONIX Take 1 tablet (40 mg total) by mouth 2 (two) times daily. For 4 weeks and then decrease to daily What changed: additional instructions   pantoprazole 40 MG tablet Commonly known as: Protonix Take 1 tablet (40 mg total) by mouth daily. What changed: You were already taking a medication with the same name, and this prescription was added. Make sure you understand how and when to take each.   pregabalin 200 MG capsule Commonly known as: LYRICA Take 200 mg by mouth 3 (three) times daily.   Promethegan 25 MG suppository Generic drug: promethazine Place 25 mg rectally every 6 (six) hours as needed for nausea/vomiting.   SUMAtriptan 100 MG tablet Commonly known as: IMITREX Take 1 tablet by mouth 2 (two) times daily as needed for migraine.   zolpidem 10 MG tablet Commonly known as: AMBIEN Take 10 mg by mouth at bedtime as needed for sleep.        Follow-up Information     Harvie Junior, MD Follow  up in 2 week(s).   Specialty: Family Medicine Why: Hospital follow up Contact information: [place] [address] [phone]                Allergies  Allergen Reactions   Morphine And Related Other (See Comments), Hives and Itching    Per her doctor Per her doctor   Acetaminophen Other (See Comments)    Liver enlargement- hep C   Ibuprofen     Per her doctor   Nsaids     Per her doctor   Prednisone Other (See Comments)    Per her doctor   Statins Other (See Comments)    Affected liver enzymes Affected liver enzymes    Consultations: General Surgery  Procedures/Studies: Abd 1 View (KUB)  Result Date: 11/03/2021 CLINICAL DATA:  Small bowel obstruction. EXAM: ABDOMEN - 1 VIEW COMPARISON:  11/02/2021 FINDINGS: Scattered gas and an ordinary amount of stool are present in the colon. There is a paucity of small bowel gas. No  dilated loops of bowel are seen to clearly indicate obstruction. Right upper quadrant abdominal surgical clips are noted. No acute osseous abnormality is seen. IMPRESSION: No evidence of bowel obstruction. Electronically Signed   By: Logan Bores M.D.   On: 11/03/2021 08:21   CT ABDOMEN PELVIS W CONTRAST  Result Date: 11/04/2021 CLINICAL DATA:  Nausea, vomiting. EXAM: CT ABDOMEN AND PELVIS WITH CONTRAST TECHNIQUE: Multidetector CT imaging of the abdomen and pelvis was performed using the standard protocol following bolus administration of intravenous contrast. CONTRAST:  4mL OMNIPAQUE IOHEXOL 350 MG/ML SOLN COMPARISON:  September 10, 2021. FINDINGS: Lower chest: No acute abnormality. Hepatobiliary: Status post cholecystectomy. Mild intrahepatic and extrahepatic biliary dilatation is noted which may be related to prior cholecystectomy. No definite hepatic abnormality is noted. Pancreas: Unremarkable. No pancreatic ductal dilatation or surrounding inflammatory changes. Spleen: Normal in size without focal abnormality. Adrenals/Urinary Tract:  Adrenal glands are unremarkable. Kidneys are normal, without renal calculi, focal lesion, or hydronephrosis. Bladder is unremarkable. Stomach/Bowel: Nasogastric tube tip is seen in distal stomach. The appendix is not clearly visualized. No colonic dilatation is noted. Small bowel reanastomosis is again noted in the right lower quadrant. There is now noted multiple loops of small bowel in the central abdomen which are thick walled with surrounding inflammatory changes concerning for focal enteritis or other inflammation. Vascular/Lymphatic: Aortic atherosclerosis. No enlarged abdominal or pelvic lymph nodes. Reproductive: Status post hysterectomy. No adnexal masses. Other: No abdominal wall hernia or abnormality. No abdominopelvic ascites. Musculoskeletal: No acute or significant osseous findings. IMPRESSION: There is interval development of significant wall thickening and surrounding inflammatory changes involving multiple small bowel loops in the central portion of the abdomen concerning for severe enteritis or other inflammation. No abnormal bowel dilatation is noted. Mild intrahepatic and extrahepatic biliary dilatation is noted which may be related to prior cholecystectomy. Aortic Atherosclerosis (ICD10-I70.0). Electronically Signed   By: Marijo Conception M.D.   On: 11/04/2021 10:40   DG ABD ACUTE 2+V W 1V CHEST  Result Date: 11/02/2021 CLINICAL DATA:  Increasing abdominal pain. Small-bowel obstruction 2 months ago. EXAM: DG ABDOMEN ACUTE WITH 1 VIEW CHEST COMPARISON:  Abdomen series 09/15/2021, CT abdomen and pelvis with IV contrast 09/10/2021 FINDINGS: Within normal limits retained stool burden. There is mild dilatation of a few left mid abdominal small bowel segments up to 3.5 cm. No other small bowel dilatation is seen. There are no findings of acute pneumoperitoneum. No radiopaque calculi are identified. There are right upper quadrant surgical clips. Visceral shadows are stable. Heart size and  mediastinal contours are within normal limits. Both lungs are clear. There are bilateral shoulder arthroplasties and a right IJ port catheter with tip in the mid SVC. IMPRESSION: 1. A few mildly dilated left mid abdominal small bowel segments, could indicate evidence of a regional ileus or low-grade small bowel obstruction, with colonic gas seen through into the sigmoid segment. 2. No other radiographic evidence of acute process. Incidental findings as above. Electronically Signed   By: Telford Nab M.D.   On: 11/02/2021 21:09   DG Abd Portable 1V  Result Date: 11/03/2021 CLINICAL DATA:  NG tube placement. EXAM: PORTABLE ABDOMEN - 1 VIEW COMPARISON:  Earlier film, same date. FINDINGS: The NG tube tip is in the antropyloric region of the stomach. Unremarkable bowel gas pattern. IMPRESSION: NG tube tip is in the antropyloric region of the stomach. Electronically Signed   By: Marijo Sanes M.D.   On: 11/03/2021 13:35    Subjective: Tolerating soft diet  Discharge Exam: Vitals:   11/08/21 0902 11/08/21 1238  BP:  134/75  Pulse:  (!) 56  Resp:  18  Temp:  97.9 F (36.6 C)  SpO2: 98% 100%   Vitals:   11/07/21 1228 11/08/21 0549 11/08/21 0902 11/08/21 1238  BP: (!) 144/72 120/70  134/75  Pulse: (!) 58 (!) 55  (!) 56  Resp: $Remo'16 18  18  'inZhU$ Temp: 98.3 F (36.8 C) 98 F (36.7 C)  97.9 F (36.6 C)  TempSrc: Oral Oral  Oral  SpO2: 100% 100% 98% 100%    General: Pt is alert, awake, not in acute distress Cardiovascular: RRR, S1/S2 + Respiratory: CTA bilaterally, no wheezing, no rhonchi Abdominal: Soft, NT, ND, bowel sounds + Extremities: no edema, no cyanosis   The results of significant diagnostics from this hospitalization (including imaging, microbiology, ancillary and laboratory) are listed below for reference.     Microbiology: Recent Results (from the past 240 hour(s))  Resp Panel by RT-PCR (Flu A&B, Covid) Nasopharyngeal Swab     Status: None   Collection Time: 11/02/21  9:56 PM    Specimen: Nasopharyngeal Swab; Nasopharyngeal(NP) swabs in vial transport medium  Result Value Ref Range Status   SARS Coronavirus 2 by RT PCR NEGATIVE NEGATIVE Final    Comment: (NOTE) SARS-CoV-2 target nucleic acids are NOT DETECTED.  The SARS-CoV-2 RNA is generally detectable in upper respiratory specimens during the acute phase of infection. The lowest concentration of SARS-CoV-2 viral copies this assay can detect is 138 copies/mL. A negative result does not preclude SARS-Cov-2 infection and should not be used as the sole basis for treatment or other patient management decisions. A negative result may occur with  improper specimen collection/handling, submission of specimen other than nasopharyngeal swab, presence of viral mutation(s) within the areas targeted by this assay, and inadequate number of viral copies(<138 copies/mL). A negative result must be combined with clinical observations, patient history, and epidemiological information. The expected result is Negative.  Fact Sheet for Patients:  EntrepreneurPulse.com.au  Fact Sheet for Healthcare Providers:  IncredibleEmployment.be  This test is no t yet approved or cleared by the [place] FDA and  has been authorized for detection and/or diagnosis of SARS-CoV-2 by FDA under an Emergency Use Authorization (EUA). This EUA will remain  in effect (meaning this test can be used) for the duration of the COVID-19 declaration under Section 564(b)(1) of the Act, 21 U.S.C.section 360bbb-3(b)(1), unless the authorization is terminated  or revoked sooner.       Influenza A by PCR NEGATIVE NEGATIVE Final   Influenza B by PCR NEGATIVE NEGATIVE Final    Comment: (NOTE) The Xpert Xpress SARS-CoV-2/FLU/RSV plus assay is intended as an aid in the diagnosis of influenza from Nasopharyngeal swab specimens and should not be used as a sole basis for treatment. Nasal washings and aspirates are  unacceptable for Xpert Xpress SARS-CoV-2/FLU/RSV testing.  Fact Sheet for Patients: EntrepreneurPulse.com.au  Fact Sheet for Healthcare Providers: IncredibleEmployment.be  This test is not yet approved or cleared by the [place] FDA and has been authorized for detection and/or diagnosis of SARS-CoV-2 by FDA under an Emergency Use Authorization (EUA). This EUA will remain in effect (meaning this test can be used) for the duration of the COVID-19 declaration under Section 564(b)(1) of the Act, 21 U.S.C. section 360bbb-3(b)(1), unless the authorization is terminated or revoked.  Performed at [facility], [place] [address] [place], [place] [address]      Labs: BNP (last 3 results) No  results for input(s): BNP in the last 8760 hours. Basic Metabolic Panel: Recent Labs  Lab 11/03/21 0525 11/04/21 0333 11/05/21 0401 11/07/21 0401 11/08/21 0346  NA 139 142 139 137 139  K 3.4* 3.9 4.0 3.6 3.9  CL 103 106 103 105 109  CO2 $Re'28 30 26 25 24  'xki$ GLUCOSE 102* 81 68* 115* 99  BUN $Re'10 10 11 'eIH$ 6* 7*  CREATININE 0.65 0.83 0.84 1.04* 1.01*  CALCIUM 8.5* 8.4* 8.8* 8.5* 8.5*  MG  --  1.8  --   --   --    Liver Function Tests: Recent Labs  Lab 11/02/21 1927 11/04/21 0333 11/05/21 0401 11/07/21 0401 11/08/21 0346  AST 16 13* $Remo'19 22 21  'afAJC$ ALT $Rem'19 14 16 19 21  'eYuM$ ALKPHOS 84 69 77 67 62  BILITOT 0.6 0.8 1.0 0.5 0.4  PROT 7.7 5.9* 6.7 6.7 6.3*  ALBUMIN 4.1 3.1* 3.5 3.7 3.4*   No results for input(s): LIPASE, AMYLASE in the last 168 hours. No results for input(s): AMMONIA in the last 168 hours. CBC: Recent Labs  Lab 11/02/21 1927 11/03/21 0525 11/05/21 0401 11/07/21 0401 11/08/21 0346  WBC 9.8 7.5 7.0 5.3 4.3  NEUTROABS 7.6  --   --   --   --   HGB 13.9 12.7 12.2 12.2 11.5*  HCT 42.5 39.6 38.2 38.1 36.4  MCV 72.6* 74.9* 74.3* 74.4* 74.9*  PLT 284 263 242 252 238   Cardiac Enzymes: No results for input(s): CKTOTAL, CKMB,  CKMBINDEX, TROPONINI in the last 168 hours. BNP: Invalid input(s): POCBNP CBG: No results for input(s): GLUCAP in the last 168 hours. D-Dimer No results for input(s): DDIMER in the last 72 hours. Hgb A1c No results for input(s): HGBA1C in the last 72 hours. Lipid Profile No results for input(s): CHOL, HDL, LDLCALC, TRIG, CHOLHDL, LDLDIRECT in the last 72 hours. Thyroid function studies No results for input(s): TSH, T4TOTAL, T3FREE, THYROIDAB in the last 72 hours.  Invalid input(s): FREET3 Anemia work up No results for input(s): VITAMINB12, FOLATE, FERRITIN, TIBC, IRON, RETICCTPCT in the last 72 hours. Urinalysis No results found for: COLORURINE, APPEARANCEUR, [place], [place], GLUCOSEU, [place], [place], [place], PROTEINUR, UROBILINOGEN, NITRITE, LEUKOCYTESUR Sepsis Labs Invalid input(s): PROCALCITONIN,  WBC,  LACTICIDVEN Microbiology Recent Results (from the past 240 hour(s))  Resp Panel by RT-PCR (Flu A&B, Covid) Nasopharyngeal Swab     Status: None   Collection Time: 11/02/21  9:56 PM   Specimen: Nasopharyngeal Swab; Nasopharyngeal(NP) swabs in vial transport medium  Result Value Ref Range Status   SARS Coronavirus 2 by RT PCR NEGATIVE NEGATIVE Final    Comment: (NOTE) SARS-CoV-2 target nucleic acids are NOT DETECTED.  The SARS-CoV-2 RNA is generally detectable in upper respiratory specimens during the acute phase of infection. The lowest concentration of SARS-CoV-2 viral copies this assay can detect is 138 copies/mL. A negative result does not preclude SARS-Cov-2 infection and should not be used as the sole basis for treatment or other patient management decisions. A negative result may occur with  improper specimen collection/handling, submission of specimen other than nasopharyngeal swab, presence of viral mutation(s) within the areas targeted by this assay, and inadequate number of viral copies(<138 copies/mL). A negative result must be combined with clinical  observations, patient history, and epidemiological information. The expected result is Negative.  Fact Sheet for Patients:  EntrepreneurPulse.com.au  Fact Sheet for Healthcare Providers:  IncredibleEmployment.be  This test is no t yet approved or cleared by the [place] and  has been authorized  for detection and/or diagnosis of SARS-CoV-2 by FDA under an Emergency Use Authorization (EUA). This EUA will remain  in effect (meaning this test can be used) for the duration of the COVID-19 declaration under Section 564(b)(1) of the Act, 21 U.S.C.section 360bbb-3(b)(1), unless the authorization is terminated  or revoked sooner.       Influenza A by PCR NEGATIVE NEGATIVE Final   Influenza B by PCR NEGATIVE NEGATIVE Final    Comment: (NOTE) The Xpert Xpress SARS-CoV-2/FLU/RSV plus assay is intended as an aid in the diagnosis of influenza from Nasopharyngeal swab specimens and should not be used as a sole basis for treatment. Nasal washings and aspirates are unacceptable for Xpert Xpress SARS-CoV-2/FLU/RSV testing.  Fact Sheet for Patients: EntrepreneurPulse.com.au  Fact Sheet for Healthcare Providers: IncredibleEmployment.be  This test is not yet approved or cleared by the [place] FDA and has been authorized for detection and/or diagnosis of SARS-CoV-2 by FDA under an Emergency Use Authorization (EUA). This EUA will remain in effect (meaning this test can be used) for the duration of the COVID-19 declaration under Section 564(b)(1) of the Act, 21 U.S.C. section 360bbb-3(b)(1), unless the authorization is terminated or revoked.  Performed at [facility], [place] [address], [place], [place] [address]    Time spent: 55min  SIGNED:   Marylu Lund, MD  [facility] 11/08/2021, 3:16 PM  If 7PM-7AM, please contact night-coverage

## 2021-11-08 NOTE — Progress Notes (Signed)
Pt alert and oriented, tolerating diet. D/c instructions given. Pt d/cd to home.

## 2021-11-08 NOTE — Telephone Encounter (Signed)
Thanks for letting me know.  I will follow-up on that RG

## 2021-12-21 ENCOUNTER — Ambulatory Visit: Payer: Medicare Other | Admitting: Gastroenterology

## 2021-12-27 ENCOUNTER — Encounter: Payer: Self-pay | Admitting: Gastroenterology

## 2021-12-27 ENCOUNTER — Other Ambulatory Visit: Payer: Self-pay

## 2021-12-27 ENCOUNTER — Ambulatory Visit (INDEPENDENT_AMBULATORY_CARE_PROVIDER_SITE_OTHER): Payer: Medicare Other | Admitting: Gastroenterology

## 2021-12-27 VITALS — BP 130/86 | HR 94 | Ht 66.0 in | Wt 191.2 lb

## 2021-12-27 DIAGNOSIS — F5105 Insomnia due to other mental disorder: Secondary | ICD-10-CM | POA: Diagnosis not present

## 2021-12-27 DIAGNOSIS — Z8719 Personal history of other diseases of the digestive system: Secondary | ICD-10-CM | POA: Diagnosis not present

## 2021-12-27 DIAGNOSIS — Z800 Family history of malignant neoplasm of digestive organs: Secondary | ICD-10-CM

## 2021-12-27 DIAGNOSIS — F419 Anxiety disorder, unspecified: Secondary | ICD-10-CM

## 2021-12-27 DIAGNOSIS — K219 Gastro-esophageal reflux disease without esophagitis: Secondary | ICD-10-CM | POA: Diagnosis not present

## 2021-12-27 MED ORDER — DICYCLOMINE HCL 10 MG PO CAPS: 10.0000 mg | ORAL_CAPSULE | Freq: Four times a day (QID) | ORAL | 11 refills | Status: DC

## 2021-12-27 MED ORDER — LUBIPROSTONE 24 MCG PO CAPS: 24.0000 ug | ORAL_CAPSULE | Freq: Every day | ORAL | 4 refills | Status: AC

## 2021-12-27 MED ORDER — PANTOPRAZOLE SODIUM 40 MG PO TBEC: 40.0000 mg | DELAYED_RELEASE_TABLET | Freq: Every day | ORAL | 4 refills | Status: AC

## 2021-12-27 NOTE — Patient Instructions (Signed)
If you are age 64 or older, your body mass index should be between 23-30. Your Body mass index is 30.87 kg/m. If this is out of the aforementioned range listed, please consider follow up with your Primary Care Provider.  If you are age 58 or younger, your body mass index should be between 19-25. Your Body mass index is 30.87 kg/m. If this is out of the aformentioned range listed, please consider follow up with your Primary Care Provider.   __________________________________________________________  The [facility] providers would like to encourage you to use [facility] to communicate with providers for non-urgent requests or questions.  Due to long hold times on the telephone, sending your provider a message by [facility] may be a faster and more efficient way to get a response.  Please allow 48 business hours for a response.  Please remember that this is for non-urgent requests.   We have sent the following medications to your pharmacy for you to pick up at your convenience:  Bentyl 10 mg Protonix 40mg  Amatiza 24 mcg  Start drinking Boost milk shakes 2 daily,  We have placed a referral to Dr Forestine Na, there office will call you to schedule an appoiment  It was a pleasure to see you today!  Jackquline Denmark, M.D.

## 2021-12-27 NOTE — Progress Notes (Signed)
IMPRESSION and PLAN:    #1. Recurrent SBO (mid small bowel with transition point along the ventral peritoneal wall LEFT of midline. Suspect adhesions related to prior surgery). S/P X-Lap in [place] March 2017, Feb 2012  #2. FH colon ca (dad at age 66). Neg colon 05/2019     #3. 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 S/P vaccines for hep A and B.   #3. GERD  #4.  Has significant anxiety/insomnia/fibromyalgia.   Plan:  -Appt with Dr Forestine Na ([facility]) -Bentyl 10mg  po QID #120, 11 refiils -Continue protonix 40mg  PO QD #90, 4RF -Continue amitiza 13mcg po qd #90, 4RF -Boost 1 BID/milk shakes.  HPI:    Chief Complaint:   Caitlyn Pacheco is a 66 y.o. female  with medical history significant for COPD, recurrent small bowel obstruction, history of CVA, HTN, HLD, hepatitis C s/p Epclusa, chronic pain syndrome, anxiety who is admitted with recurrent small bowel obstruction  Adm [facility], req NG tube. CT showed PSBO, managed conservatively  On liquid diet currently As solids make her sick Has nausea but no vomiting Generalized abdominal pain with bloating.  Takes Amitiza which results in bowel movements.  No melena or hematochezia.  Has lost weight as below   Wt Readings from Last 3 Encounters:  12/27/21 191 lb 4 oz (86.8 kg)  09/10/21 194 lb 2 oz (88.1 kg)  08/20/21 194 lb 2 oz (88.1 kg)        Additional GI history/procedures:  Colon 05/2019 -Diminutive colonic polyp status post polypectomy. -Minimal colonic diverticulosis -Small internal hemorrhoids. -Otherwise normal colonoscopy to TI.  CT AP with contrast 10/2021 There is interval development of significant wall thickening and surrounding inflammatory changes involving multiple small bowel loops in the central portion of the abdomen concerning for severe enteritis or other inflammation. No abnormal bowel dilatation is noted. Mild intrahepatic and extrahepatic biliary  dilatation is noted which may be related to prior cholecystectomy. Aortic Atherosclerosis (ICD10-I70.0).  CT AP 08/2021 1. Small bowel obstruction pattern in the mid small bowel. Transition point along the ventral peritoneal wall LEFT of midline. Suspect adhesions related to prior surgery. 2. No pneumatosis or portal venous gas.  No perforation   CT AP with contrast 12/2020 1. No acute findings or explanation for the patient's symptoms. 2. No evidence of urinary tract calculus or hydronephrosis. 3. Stable mild extrahepatic biliary dilatation post cholecystectomy, likely physiologic. Correlate with liver function studies. 4. Aortic Atherosclerosis (ICD10-I70.0). Past Medical History:  Diagnosis Date   Anxiety    Blood transfusion without reported diagnosis    CHF (congestive heart failure) (HCC)    Chronic pain syndrome    COPD (chronic obstructive pulmonary disease) (HCC)    GERD (gastroesophageal reflux disease)    HCV (hepatitis C virus)    Hyperlipidemia    Hypertension    Neuromuscular disorder ([place])    nerve damage due to stroke.   Osteoarthritis    Osteoporosis    Sleep apnea    Doesn't use CPAP   Stroke ([place]) 1996   one stroke and two TIA's   Thyroid disease     Current Outpatient Medications  Medication Sig Dispense Refill   albuterol (ACCUNEB) 1.25 MG/3ML nebulizer solution Take 3 mLs by nebulization 3 (three) times daily as needed for wheezing or shortness of breath.      albuterol (VENTOLIN HFA) 108 (90 Base) MCG/ACT inhaler Inhale 2 puffs into the lungs  every 6 (six) hours as needed for wheezing or shortness of breath.     aspirin 81 MG chewable tablet Chew 81 mg by mouth daily.     bismuth subsalicylate (PEPTO BISMOL) 262 MG/15ML suspension Take 30 mLs by mouth every 6 (six) hours as needed for indigestion.     budesonide-formoterol (SYMBICORT) 160-4.5 MCG/ACT inhaler Inhale 2 puffs into the lungs 2 (two) times daily.     carbamide peroxide (DEBROX) 6.5 %  OTIC solution 5 drops 2 (two) times daily as needed (ear pain).     clopidogrel (PLAVIX) 75 MG tablet Take 75 mg by mouth daily.     diazepam (VALIUM) 10 MG tablet Take 10 mg by mouth 3 (three) times daily as needed for anxiety.     dicyclomine (BENTYL) 10 MG capsule Take 1 capsule (10 mg total) by mouth 2 (two) times daily. 60 capsule 2   ezetimibe (ZETIA) 10 MG tablet Take 10 mg by mouth daily.     famotidine (PEPCID) 40 MG tablet Take 40 mg by mouth 2 (two) times daily.     fenofibrate 160 MG tablet Take 160 mg by mouth daily.     fluticasone (FLONASE) 50 MCG/ACT nasal spray Place 2 sprays into both nostrils daily as needed for allergies or rhinitis.     Fluticasone-Umeclidin-Vilant 100-62.5-25 MCG/ACT AEPB Take 1 puff by mouth daily.     furosemide (LASIX) 40 MG tablet Take 0.5 tablets (20 mg total) by mouth daily. (Patient taking differently: Take 40 mg by mouth 2 (two) times daily as needed for fluid.) 30 tablet 0   gemfibrozil (LOPID) 600 MG tablet Take 600 mg by mouth 2 (two) times daily.     lubiprostone (AMITIZA) 24 MCG capsule Take 24 mcg by mouth 2 (two) times daily with a meal.     naloxone (NARCAN) nasal spray 4 mg/0.1 mL Place 1 spray into the nose once as needed (over dose).     Olmesartan-amLODIPine-HCTZ 40-10-25 MG TABS Take 1 tablet by mouth daily.     oxyCODONE (ROXICODONE) 15 MG immediate release tablet Take 15 mg by mouth 4 (four) times daily as needed for pain.     pantoprazole (PROTONIX) 40 MG tablet Take 1 tablet (40 mg total) by mouth daily. 30 tablet 0   pregabalin (LYRICA) 200 MG capsule Take 200 mg by mouth 3 (three) times daily.      PROMETHEGAN 25 MG suppository Place 25 mg rectally every 6 (six) hours as needed for nausea/vomiting.     SUMAtriptan (IMITREX) 100 MG tablet Take 1 tablet by mouth 2 (two) times daily as needed for migraine.     zolpidem (AMBIEN) 10 MG tablet Take 10 mg by mouth at bedtime as needed for sleep.     No current facility-administered  medications for this visit.    Past Surgical History:  Procedure Laterality Date   ABDOMINAL HYSTERECTOMY     ABDOMINAL SURGERY     CHOLECYSTECTOMY     COLONOSCOPY  12/26/2014   Very minimal sigmoid diverticulosis. Small internal hemorrhoids.    ESOPHAGOGASTRODUODENOSCOPY  01/21/2011   Normal EGD.    THYROID SURGERY     TOTAL SHOULDER REPLACEMENT Right 09/01/2018    Family History  Problem Relation Age of Onset   Hepatitis C Sister    Rectal cancer Paternal Grandfather    Colon cancer Maternal Aunt    Rectal cancer Maternal Aunt    Esophageal cancer Neg Hx    Stomach cancer Neg Hx  Social History   Tobacco Use   Smoking status: Every Day    Packs/day: 0.50    Types: Cigarettes   Smokeless tobacco: Never   Tobacco comments:    pt rpeorts using about 6 per day  Vaping Use   Vaping Use: Never used  Substance Use Topics   Alcohol use: No   Drug use: No    Allergies  Allergen Reactions   Morphine And Related Other (See Comments), Hives and Itching    Per her doctor Per her doctor   Acetaminophen Other (See Comments)    Liver enlargement- hep C   Ibuprofen     Per her doctor   Nsaids     Per her doctor   Prednisone Other (See Comments)    Per her doctor   Statins Other (See Comments)    Affected liver enzymes Affected liver enzymes     Review of Systems: All systems reviewed and negative except where noted in HPI.  Has not been sleeping well.   Physical Exam:     BP 130/86    Pulse 94    Ht $R'5\' 6"'cx$  (1.676 m)    Wt 191 lb 4 oz (86.8 kg)    SpO2 99%    BMI 30.87 kg/m  GENERAL:  Alert, oriented, cooperative, not in acute distress. PSYCH: :Pleasant, normal mood and affect. HEENT:  conjunctiva pink, mucous membranes moist, neck supple without masses. No jaundice. CARDIAC:  S1 S2 normal. No murmers. PULM: Normal respiratory effort, lungs CTA bilaterally, no wheezing. ABDOMEN: Mildly distended.  Generalized abdominal wall tenderness.  Bowel sounds are  present.  Multiple well-healed surgical scars. Rectal exam: Deferred SKIN:  turgor, no lesions seen. Musculoskeletal:  Normal muscle tone, normal strength. NEURO: Alert and oriented x 3, no focal neurologic deficits.   Data Reviewed: I have personally reviewed following labs and imaging studies CBC Latest Ref Rng & Units 11/08/2021 11/07/2021 11/05/2021  WBC 4.0 - 10.5 K/uL 4.3 5.3 7.0  Hemoglobin 12.0 - 15.0 g/dL 11.5(L) 12.2 12.2  Hematocrit 36.0 - 46.0 % 36.4 38.1 38.2  Platelets 150 - 400 K/uL 238 252 242   Hepatic Function Latest Ref Rng & Units 11/08/2021 11/07/2021 11/05/2021  Total Protein 6.5 - 8.1 g/dL 6.3(L) 6.7 6.7  Albumin 3.5 - 5.0 g/dL 3.4(L) 3.7 3.5  AST 15 - 41 U/L $Remo'21 22 19  'gnUOK$ ALT 0 - 44 U/L $Remo'21 19 16  'qtDbB$ Alk Phosphatase 38 - 126 U/L 62 67 77  Total Bilirubin 0.3 - 1.2 mg/dL 0.4 0.5 1.0  25 minutes spent with the patient today. Greater than 50% was spent in counseling and coordination of care with the patient    Ronaldo Miyamoto 12/27/2021, 2:29 PM   CC Harvie Junior, MD

## 2021-12-28 ENCOUNTER — Telehealth: Payer: Self-pay | Admitting: Gastroenterology

## 2021-12-28 NOTE — Telephone Encounter (Signed)
Tried to call the patient. No answer and no VM set up. Her referral for Dr. Amalia Hailey General Surgeon is faxed today.

## 2021-12-28 NOTE — Telephone Encounter (Signed)
Patient called inquiring about the referral Dr. Lyndel Safe had made for her yesterday.  Please call patient and advise.  Thank you.

## 2022-04-07 ENCOUNTER — Telehealth: Payer: Self-pay | Admitting: Gastroenterology

## 2022-04-07 NOTE — Telephone Encounter (Signed)
Inbound call from patient stating that she needed to make an appointment to see Dr. Lyndel Safe. Patient stated she is having a hard time swallowing anything she tries to eat or drink and its hurting badly. Patient was offered Dr. Steve Rattler next opening and patient stated she could not wait that long. Patient is requesting a call back to discuss. Please advise.

## 2022-04-07 NOTE — Telephone Encounter (Signed)
Pt stated that she has lost 17 pounds in one week and cannot eat and even water is getting hung in her throat. Pt requesting to see Dr. Lyndel Safe. Pt was notified with the symptoms that she is describing then she needs to go to the ED for evaluation and treatment: Pt stated that she would go to the ED as directed . Pt still requested office visit with Dr Lyndel Safe: Pt scheduled for 05/06/2022 at 2:10 PM: Pt made aware:  Address provided:  Pt verbalized understanding with all questions answered.

## 2022-05-06 ENCOUNTER — Ambulatory Visit: Payer: Medicare Other | Admitting: Gastroenterology

## 2022-05-10 ENCOUNTER — Encounter: Payer: Self-pay | Admitting: Gastroenterology

## 2022-05-10 ENCOUNTER — Ambulatory Visit (INDEPENDENT_AMBULATORY_CARE_PROVIDER_SITE_OTHER): Payer: Medicare Other | Admitting: Gastroenterology

## 2022-05-10 ENCOUNTER — Ambulatory Visit (INDEPENDENT_AMBULATORY_CARE_PROVIDER_SITE_OTHER)
Admission: RE | Admit: 2022-05-10 | Discharge: 2022-05-10 | Disposition: A | Discharge status: Home / Self Care | Payer: Medicare Other | Source: Ambulatory Visit | Attending: Gastroenterology | Admitting: Gastroenterology

## 2022-05-10 VITALS — BP 128/80 | HR 75 | Ht 66.0 in | Wt 181.5 lb

## 2022-05-10 DIAGNOSIS — G8929 Other chronic pain: Secondary | ICD-10-CM

## 2022-05-10 DIAGNOSIS — R1310 Dysphagia, unspecified: Secondary | ICD-10-CM

## 2022-05-10 DIAGNOSIS — K56609 Unspecified intestinal obstruction, unspecified as to partial versus complete obstruction: Secondary | ICD-10-CM

## 2022-05-10 DIAGNOSIS — K581 Irritable bowel syndrome with constipation: Secondary | ICD-10-CM

## 2022-05-10 DIAGNOSIS — B182 Chronic viral hepatitis C: Secondary | ICD-10-CM

## 2022-05-10 DIAGNOSIS — K219 Gastro-esophageal reflux disease without esophagitis: Secondary | ICD-10-CM

## 2022-05-10 DIAGNOSIS — R109 Unspecified abdominal pain: Secondary | ICD-10-CM

## 2022-05-10 MED ORDER — DICYCLOMINE HCL 10 MG PO CAPS: 10.0000 mg | ORAL_CAPSULE | Freq: Two times a day (BID) | ORAL | 3 refills | Status: AC

## 2022-05-10 MED ORDER — PANTOPRAZOLE SODIUM 40 MG PO TBEC: 40.0000 mg | DELAYED_RELEASE_TABLET | Freq: Two times a day (BID) | ORAL | 3 refills | Status: AC

## 2022-05-10 NOTE — Progress Notes (Signed)
IMPRESSION and PLAN:    #1. GERD with dysphagia. H/O wt loss is concerning  #2. Recurrent SBO (mid small bowel with transition point along the ventral peritoneal wall LEFT of midline. Suspect adhesions related to prior surgery). S/P X-Lap in [place] March 2017, Feb 2012  #3. IBS-C/OIC with chronic abdo pain  #4. FH colon ca (dad at age 66). Neg colon 05/2019. Next due 05/2026.    #5. 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 S/P vaccines for hep A and B.  #6.  Has significant anxiety/insomnia/fibromyalgia.   Plan: -EGD with dil after holding plavix x 5 days -X ray KUB 2 V today -Continue protonix 40mg  PO Bid  -Miralax 17g po BID -Continue amitiza 73mcg po qd #90, 4RF -bentyl 10mg  po BID #60 prn, 2RF  HPI:    Chief Complaint:   CHRIS NARASIMHAN is a 66 y.o. female  with H/O COPD, recurrent small bowel obstruction, history of CVA on plavix, HTN, HLD, hepatitis C s/p Epclusa, chronic pain syndrome on narcotics, anxiety  With 3 week H/O progressive dysphagia to both solids and liquids.  Associated heartburn, 10 pound weight loss over the last 4 months as below.  Some odynophagia.  Has been taking Protonix 40 mg p.o. twice daily.  Sent to GI clinic for possible EGD with Dil.  No N/V.   Has chronic generalized abdominal pain with constipation.  Somewhat better with Bentyl and Amitiza.  She is also been using MiraLAX 17 g p.o. twice daily.  Continues to be on narcotics.  No melena or hematochezia.  Has lost weight as below   Wt Readings from Last 3 Encounters:  05/10/22 181 lb 8 oz (82.3 kg)  12/27/21 191 lb 4 oz (86.8 kg)  09/10/21 194 lb 2 oz (88.1 kg)        Additional GI history/procedures:  Colon 05/2019 -Diminutive colonic polyp status post polypectomy. Bx- TA -Minimal colonic diverticulosis -Small internal hemorrhoids. -Otherwise normal colonoscopy to TI. -Rpt 7 yrs.  CT AP with contrast 10/2021 There is interval development of  significant wall thickening and surrounding inflammatory changes involving multiple small bowel loops in the central portion of the abdomen concerning for severe enteritis or other inflammation. No abnormal bowel dilatation is noted. Mild intrahepatic and extrahepatic biliary dilatation is noted which may be related to prior cholecystectomy. Aortic Atherosclerosis (ICD10-I70.0).  CT AP 08/2021 1. Small bowel obstruction pattern in the mid small bowel. Transition point along the ventral peritoneal wall LEFT of midline. Suspect adhesions related to prior surgery. 2. No pneumatosis or portal venous gas.  No perforation   CT AP with contrast 12/2020 1. No acute findings or explanation for the patient's symptoms. 2. No evidence of urinary tract calculus or hydronephrosis. 3. Stable mild extrahepatic biliary dilatation post cholecystectomy, likely physiologic. Correlate with liver function studies. 4. Aortic Atherosclerosis (ICD10-I70.0).  Adm to [facility] x [facility], req NG tube. CT showed PSBO, managed conservatively Past Medical History:  Diagnosis Date   Anxiety    Blood transfusion without reported diagnosis    CHF (congestive heart failure) (HCC)    Chronic pain syndrome    COPD (chronic obstructive pulmonary disease) (HCC)    GERD (gastroesophageal reflux disease)    HCV (hepatitis C virus)    Hyperlipidemia    Hypertension    Neuromuscular disorder ([place])    nerve damage due to stroke.   Osteoarthritis    Osteoporosis    Sleep apnea  Doesn't use CPAP   Stroke ([place]) 1996   one stroke and two TIA's   Thyroid disease     Current Outpatient Medications  Medication Sig Dispense Refill   albuterol (ACCUNEB) 1.25 MG/3ML nebulizer solution Take 3 mLs by nebulization 3 (three) times daily as needed for wheezing or shortness of breath.      albuterol (VENTOLIN HFA) 108 (90 Base) MCG/ACT inhaler Inhale 2 puffs into the lungs every 6 (six) hours as needed for wheezing or shortness of breath.      aspirin 81 MG chewable tablet Chew 81 mg by mouth daily.     bismuth subsalicylate (PEPTO BISMOL) 262 MG/15ML suspension Take 30 mLs by mouth every 6 (six) hours as needed for indigestion.     budesonide-formoterol (SYMBICORT) 160-4.5 MCG/ACT inhaler Inhale 2 puffs into the lungs 2 (two) times daily.     carbamide peroxide (DEBROX) 6.5 % OTIC solution 5 drops 2 (two) times daily as needed (ear pain).     diazepam (VALIUM) 10 MG tablet Take 10 mg by mouth 3 (three) times daily as needed for anxiety.     dicyclomine (BENTYL) 10 MG capsule Take 1 capsule (10 mg total) by mouth in the morning, at noon, in the evening, and at bedtime. 120 capsule 11   ezetimibe (ZETIA) 10 MG tablet Take 10 mg by mouth daily.     famotidine (PEPCID) 40 MG tablet Take 40 mg by mouth 2 (two) times daily.     fenofibrate 160 MG tablet Take 160 mg by mouth daily.     fluticasone (FLONASE) 50 MCG/ACT nasal spray Place 2 sprays into both nostrils daily as needed for allergies or rhinitis.     Fluticasone-Umeclidin-Vilant 100-62.5-25 MCG/ACT AEPB Take 1 puff by mouth daily.     furosemide (LASIX) 40 MG tablet Take 0.5 tablets (20 mg total) by mouth daily. (Patient taking differently: Take 40 mg by mouth 2 (two) times daily as needed for fluid.) 30 tablet 0   gemfibrozil (LOPID) 600 MG tablet Take 600 mg by mouth 2 (two) times daily.     lubiprostone (AMITIZA) 24 MCG capsule Take 1 capsule (24 mcg total) by mouth daily. 90 capsule 4   naloxone (NARCAN) nasal spray 4 mg/0.1 mL Place 1 spray into the nose once as needed (over dose).     Olmesartan-amLODIPine-HCTZ 40-10-25 MG TABS Take 1 tablet by mouth daily.     oxyCODONE (ROXICODONE) 15 MG immediate release tablet Take 15 mg by mouth 4 (four) times daily as needed for pain.     pantoprazole (PROTONIX) 40 MG tablet Take 1 tablet (40 mg total) by mouth daily. 90 tablet 4   pregabalin (LYRICA) 200 MG capsule Take 200 mg by mouth 3 (three) times daily.      PROMETHEGAN 25 MG  suppository Place 25 mg rectally every 6 (six) hours as needed for nausea/vomiting.     SUMAtriptan (IMITREX) 100 MG tablet Take 1 tablet by mouth 2 (two) times daily as needed for migraine.     zolpidem (AMBIEN) 10 MG tablet Take 10 mg by mouth at bedtime as needed for sleep.     clopidogrel (PLAVIX) 75 MG tablet Take 75 mg by mouth daily. (Patient not taking: Reported on 05/10/2022)     No current facility-administered medications for this visit.    Past Surgical History:  Procedure Laterality Date   ABDOMINAL HYSTERECTOMY     ABDOMINAL SURGERY     CHOLECYSTECTOMY     COLONOSCOPY  12/26/2014  Very minimal sigmoid diverticulosis. Small internal hemorrhoids.    ESOPHAGOGASTRODUODENOSCOPY  01/21/2011   Normal EGD.    THYROID SURGERY     TOTAL SHOULDER REPLACEMENT Right 09/01/2018    Family History  Problem Relation Age of Onset   Hepatitis C Sister    Rectal cancer Paternal Grandfather    Colon cancer Maternal Aunt    Rectal cancer Maternal Aunt    Esophageal cancer Neg Hx    Stomach cancer Neg Hx     Social History   Tobacco Use   Smoking status: Every Day    Packs/day: 0.50    Types: Cigarettes   Smokeless tobacco: Never   Tobacco comments:    pt rpeorts using about 6 per day  Vaping Use   Vaping Use: Never used  Substance Use Topics   Alcohol use: No   Drug use: No    Allergies  Allergen Reactions   Morphine And Related Other (See Comments), Hives and Itching    Per her doctor Per her doctor   Acetaminophen Other (See Comments)    Liver enlargement- hep C   Ibuprofen     Per her doctor   Nsaids     Per her doctor   Prednisone Other (See Comments)    Per her doctor   Statins Other (See Comments)    Affected liver enzymes Affected liver enzymes     Review of Systems: All systems reviewed and negative except where noted in HPI.  Has not been sleeping well.   Physical Exam:     BP 128/80   Pulse 75   Ht $R'5\' 6"'Qt$  (1.676 m)   Wt 181 lb 8 oz (82.3  kg)   SpO2 98%   BMI 29.29 kg/m  GENERAL:  Alert, oriented, cooperative, not in acute distress. PSYCH: :Pleasant, normal mood and affect. HEENT:  conjunctiva pink, mucous membranes moist, neck supple without masses. No jaundice. CARDIAC:  S1 S2 normal. No murmers. PULM: Normal respiratory effort, lungs CTA bilaterally, no wheezing. ABDOMEN: Mildly distended.  Generalized abdominal wall tenderness.  Bowel sounds are present.  Multiple well-healed surgical scars. Rectal exam: Deferred SKIN:  turgor, no lesions seen. Musculoskeletal:  Normal muscle tone, normal strength. NEURO: Alert and oriented x 3, no focal neurologic deficits.   Data Reviewed: I have personally reviewed following labs and imaging studies    Latest Ref Rng & Units 11/08/2021    3:46 AM 11/07/2021    4:01 AM 11/05/2021    4:01 AM  CBC  WBC 4.0 - 10.5 K/uL 4.3  5.3  7.0   Hemoglobin 12.0 - 15.0 g/dL 11.5  12.2  12.2   Hematocrit 36.0 - 46.0 % 36.4  38.1  38.2   Platelets 150 - 400 K/uL 238  252  242       Latest Ref Rng & Units 11/08/2021    3:46 AM 11/07/2021    4:01 AM 11/05/2021    4:01 AM  Hepatic Function  Total Protein 6.5 - 8.1 g/dL 6.3  6.7  6.7   Albumin 3.5 - 5.0 g/dL 3.4  3.7  3.5   AST 15 - 41 U/L $Remo'21  22  19   'RytDg$ ALT 0 - 44 U/L $Remo'21  19  16   'QhfgG$ Alk Phosphatase 38 - 126 U/L 62  67  77   Total Bilirubin 0.3 - 1.2 mg/dL 0.4  0.5  1.0       Winter Trefz,MD 05/10/2022, 1:50 PM   CC York Ram M,  MD 

## 2022-05-10 NOTE — Patient Instructions (Addendum)
If you are age 64 or older, your body mass index should be between 23-30. Your Body mass index is 29.29 kg/m. If this is out of the aforementioned range listed, please consider follow up with your Primary Care Provider.  If you are age 16 or younger, your body mass index should be between 19-25. Your Body mass index is 29.29 kg/m. If this is out of the aformentioned range listed, please consider follow up with your Primary Care Provider.   ________________________________________________________  The [facility] providers would like to encourage you to use [facility] to communicate with providers for non-urgent requests or questions.  Due to long hold times on the telephone, sending your provider a message by [facility] may be a faster and more efficient way to get a response.  Please allow 48 business hours for a response.  Please remember that this is for non-urgent requests.  _______________________________________________________  Dennis Bast have stated to [place] that you are already of your off your plavix and have been for 5 days and stated that you would like to have June 29 as your procedure date and that we will not be able to obtain clearance from your primary care doctor although we can try but we will not get it for your blood thinner and that you stopped it on your own.  Your provider has requested that you have an abdominal x ray before leaving today. Please go to the basement floor to our Radiology department for the test.  We have sent the following medications to your pharmacy for you to pick up at your convenience: Protonix 2 times a day Miralax 17g 2 times a day in 8oz of water Bentyl 2 times a day as needed   Continue Amitizia  You have been scheduled for an endoscopy. Please follow written instructions given to you at your visit today. If you use inhalers (even only as needed), please bring them with you on the day of your procedure.  Please call with any questions or concerns.  Thank  you,  Dr. Jackquline Denmark

## 2022-05-12 ENCOUNTER — Telehealth: Payer: Self-pay

## 2022-05-12 NOTE — Telephone Encounter (Signed)
Spoke to [place] from Dr Jimmye Norman office (p [phone] fax [phone]) and she said she received my cardiac clearance from 6-20 she had it in her hand. They were off June 19th and they have been busy but she will give it to the doctor to sign off today and fax it back to me as soon as possible.

## 2022-05-12 NOTE — Telephone Encounter (Signed)
Dr Jimmye Norman said that pt said can hold Plavix 5 days prior to procedure and that can resume post op day 1  Called patient and said that she has stopped it already for 8 days already and she aware.

## 2022-05-19 ENCOUNTER — Ambulatory Visit (AMBULATORY_SURGERY_CENTER): Payer: Medicare Other | Admitting: Gastroenterology

## 2022-05-19 ENCOUNTER — Encounter: Payer: Self-pay | Admitting: Gastroenterology

## 2022-05-19 VITALS — BP 141/84 | HR 58 | Temp 98.9°F | Resp 12 | Ht 66.0 in | Wt 181.0 lb

## 2022-05-19 DIAGNOSIS — K222 Esophageal obstruction: Secondary | ICD-10-CM | POA: Diagnosis not present

## 2022-05-19 DIAGNOSIS — K2289 Other specified disease of esophagus: Secondary | ICD-10-CM | POA: Diagnosis not present

## 2022-05-19 DIAGNOSIS — K449 Diaphragmatic hernia without obstruction or gangrene: Secondary | ICD-10-CM | POA: Diagnosis not present

## 2022-05-19 DIAGNOSIS — K219 Gastro-esophageal reflux disease without esophagitis: Secondary | ICD-10-CM

## 2022-05-19 DIAGNOSIS — R1013 Epigastric pain: Secondary | ICD-10-CM

## 2022-05-19 MED ORDER — SODIUM CHLORIDE 0.9 % IV SOLN: 500.0000 mL | INTRAVENOUS | Status: DC

## 2022-05-19 NOTE — Op Note (Signed)
[facility] Patient Name: Caitlyn Pacheco Procedure Date: 05/19/2022 2:33 PM MRN: [id number] Endoscopist: Jackquline Denmark , MD Age: 66 Referring MD:  Date of Birth: Aug 10, 1956 Gender: Female Account #: [id number] Procedure:                Upper GI endoscopy Indications:              Dysphagia Medicines:                Monitored Anesthesia Care Procedure:                Pre-Anesthesia Assessment:                           - Prior to the procedure, a History and Physical                            was performed, and patient medications and                            allergies were reviewed. The patient's tolerance of                            previous anesthesia was also reviewed. The risks                            and benefits of the procedure and the sedation                            options and risks were discussed with the patient.                            All questions were answered, and informed consent                            was obtained. Prior Anticoagulants: The patient has                            taken Plavix (clopidogrel), last dose was 5 days                            prior to procedure. ASA Grade Assessment: III - A                            patient with severe systemic disease. After                            reviewing the risks and benefits, the patient was                            deemed in satisfactory condition to undergo the                            procedure.  After obtaining informed consent, the endoscope was                            passed under direct vision. Throughout the                            procedure, the patient's blood pressure, pulse, and                            oxygen saturations were monitored continuously. The                            GIF [id number] #[id number] was introduced through the                            mouth, and advanced to the second part of duodenum.                            The  upper GI endoscopy was accomplished without                            difficulty. The patient tolerated the procedure                            well. Scope In: Scope Out: Findings:                 The examined esophagus was normal. Biopsies were                            obtained from the proximal and distal esophagus                            with cold forceps for histology to r/o eosinophilic                            esophagitis.                           A widely patent Schatzki ring was found at the                            gastroesophageal junction, 35 cm from the incisors.                            The scope was withdrawn. Dilation was performed                            with a Maloney dilator with mild resistance at 20                            Fr and 54 Fr.                           A small hiatal hernia was present.  The exam was otherwise without abnormality.                            Multiple biopsies were obtained from the stomach                            and from the duodenum. Complications:            No immediate complications. Estimated Blood Loss:     Estimated blood loss: none. Impression:               - Widely patent Schatzki ring. Dilated.                           - Small hiatal hernia.                           - The examination was otherwise normal. Recommendation:           - Patient has a contact number available for                            emergencies. The signs and symptoms of potential                            delayed complications were discussed with the                            patient. Return to normal activities tomorrow.                            Written discharge instructions were provided to the                            patient.                           - Post dilatation diet.                           - Continue present medications Protonix 40 BID x 2                            weeks, then reduce it  to once a day                           - Await pathology results.                           - Resume Plavix from 05/20/2022                           - The findings and recommendations were discussed                            with the patient's family. Jackquline Denmark, MD 05/19/2022 2:44:39 PM This report has been signed electronically.

## 2022-05-19 NOTE — Patient Instructions (Addendum)
Please read handouts provided. Continue present medications. Await pathology results. Continue Protonix twice daily for 2 weeks, then reduce to once a day. Resume Plavix 05/20/2022. Dilation Diet.  YOU HAD AN ENDOSCOPIC PROCEDURE TODAY AT [facility]:   Refer to the procedure report that was given to you for any specific questions about what was found during the examination.  If the procedure report does not answer your questions, please call your gastroenterologist to clarify.  If you requested that your care partner not be given the details of your procedure findings, then the procedure report has been included in a sealed envelope for you to review at your convenience later.  YOU SHOULD EXPECT: Some feelings of bloating in the abdomen. Passage of more gas than usual.  Walking can help get rid of the air that was put into your GI tract during the procedure and reduce the bloating. If you had a lower endoscopy (such as a colonoscopy or flexible sigmoidoscopy) you may notice spotting of blood in your stool or on the toilet paper. If you underwent a bowel prep for your procedure, you may not have a normal bowel movement for a few days.  Please Note:  You might notice some irritation and congestion in your nose or some drainage.  This is from the oxygen used during your procedure.  There is no need for concern and it should clear up in a day or so.  SYMPTOMS TO REPORT IMMEDIATELY:   Following upper endoscopy (EGD)  Vomiting of blood or coffee ground material  New chest pain or pain under the shoulder blades  Painful or persistently difficult swallowing  New shortness of breath  Fever of 100F or higher  Black, tarry-looking stools  For urgent or emergent issues, a gastroenterologist can be reached at any hour by calling [phone]. Do not use MyChart messaging for urgent concerns.    DIET:   Drink plenty of fluids but you should avoid alcoholic beverages for 24  hours.  ACTIVITY:  You should plan to take it easy for the rest of today and you should NOT DRIVE or use heavy machinery until tomorrow (because of the sedation medicines used during the test).    FOLLOW UP: Our staff will call the number listed on your records the next business day following your procedure.  We will call around 7:15- 8:00 am to check on you and address any questions or concerns that you may have regarding the information given to you following your procedure. If we do not reach you, we will leave a message.  If you develop any symptoms (ie: fever, flu-like symptoms, shortness of breath, cough etc.) before then, please call [phone].  If you test positive for Covid 19 in the 2 weeks post procedure, please call and report this information to [place].    If any biopsies were taken you will be contacted by phone or by letter within the next 1-3 weeks.  Please call us at [phone] if you have not heard about the biopsies in 3 weeks.    SIGNATURES/CONFIDENTIALITY: You and/or your care partner have signed paperwork which will be entered into your electronic medical record.  These signatures attest to the fact that that the information above on your After Visit Summary has been reviewed and is understood.  Full responsibility of the confidentiality of this discharge information lies with you and/or your care-partner.

## 2022-05-19 NOTE — Progress Notes (Signed)
Patient left all handouts and paperwork from procedure in PACU. Patient called at 1545 to tell patient she had left paperwork and nurse would mail all paperwork to her. Patient also informed to begin Plavix on 05/20/2022. Dilation Diet for this evening reviewed with patient, no questions noted. Discharge instructions also reviewed again with patient and caretaker on phone, no questions noted. B.Prajwal Fellner RN.

## 2022-05-19 NOTE — Progress Notes (Signed)
Report to PACU, RN, vss, BBS= Clear.  

## 2022-05-19 NOTE — Progress Notes (Signed)
Called to room to assist during endoscopic procedure.  Patient ID and intended procedure confirmed with present staff. Received instructions for my participation in the procedure from the performing physician.  

## 2022-05-19 NOTE — Progress Notes (Signed)
IMPRESSION and PLAN:    #1. GERD with dysphagia. H/O wt loss is concerning  #2. Recurrent SBO (mid small bowel with transition point along the ventral peritoneal wall LEFT of midline. Suspect adhesions related to prior surgery). S/P X-Lap in [place] March 2017, Feb 2012  #3. IBS-C/OIC with chronic abdo pain  #4. FH colon ca (dad at age 64). Neg colon 05/2019. Next due 05/2026.    #5. 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 S/P vaccines for hep A and B.  #6.  Has significant anxiety/insomnia/fibromyalgia.   Plan: -EGD with dil after holding plavix x 5 days -X ray KUB 2 V today -Continue protonix 72m PO Bid  -Miralax 17g po BID -Continue amitiza 225m po qd #90, 4RF -bentyl 1037mo BID #60 prn, 2RF  HPI:    Chief Complaint:   Caitlyn Pacheco a 66 38o. female  with H/O COPD, recurrent small bowel obstruction, history of CVA on plavix, HTN, HLD, hepatitis C s/p Epclusa, chronic pain syndrome on narcotics, anxiety  With 3 week H/O progressive dysphagia to both solids and liquids.  Associated heartburn, 10 pound weight loss over the last 4 months as below.  Some odynophagia.  Has been taking Protonix 40 mg p.o. twice daily.  Sent to GI clinic for possible EGD with Dil.  No N/V.   Has chronic generalized abdominal pain with constipation.  Somewhat better with Bentyl and Amitiza.  She is also been using MiraLAX 17 g p.o. twice daily.  Continues to be on narcotics.  No melena or hematochezia.  Has lost weight as below   Wt Readings from Last 3 Encounters:  05/19/22 181 lb (82.1 kg)  05/10/22 181 lb 8 oz (82.3 kg)  12/27/21 191 lb 4 oz (86.8 kg)        Additional GI history/procedures:  Colon 05/2019 -Diminutive colonic polyp status post polypectomy. Bx- TA -Minimal colonic diverticulosis -Small internal hemorrhoids. -Otherwise normal colonoscopy to TI. -Rpt 7 yrs.  CT AP with contrast 10/2021 There is interval development of  significant wall thickening and surrounding inflammatory changes involving multiple small bowel loops in the central portion of the abdomen concerning for severe enteritis or other inflammation. No abnormal bowel dilatation is noted. Mild intrahepatic and extrahepatic biliary dilatation is noted which may be related to prior cholecystectomy. Aortic Atherosclerosis (ICD10-I70.0).  CT AP 08/2021 1. Small bowel obstruction pattern in the mid small bowel. Transition point along the ventral peritoneal wall LEFT of midline. Suspect adhesions related to prior surgery. 2. No pneumatosis or portal venous gas.  No perforation   CT AP with contrast 12/2020 1. No acute findings or explanation for the patient's symptoms. 2. No evidence of urinary tract calculus or hydronephrosis. 3. Stable mild extrahepatic biliary dilatation post cholecystectomy, likely physiologic. Correlate with liver function studies. 4. Aortic Atherosclerosis (ICD10-I70.0).  Adm to [facility] x 2, req NG tube. 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   Osteoarthritis    Osteoporosis    Sleep apnea  Doesn't use CPAP   Stroke ([place]) 1996   one stroke and two TIA's   Thyroid disease     Current Outpatient Medications  Medication Sig Dispense Refill   albuterol (ACCUNEB) 1.25 MG/3ML nebulizer solution Take 3 mLs by nebulization 3 (three) times daily as needed for wheezing or shortness of breath.      albuterol (VENTOLIN HFA) 108 (90 Base) MCG/ACT inhaler Inhale 2 puffs into the lungs every 6 (six) hours as needed for wheezing or shortness of breath.      aspirin 81 MG chewable tablet Chew 81 mg by mouth daily.     bismuth subsalicylate (PEPTO BISMOL) 262 MG/15ML suspension Take 30 mLs by mouth every 6 (six) hours as needed for indigestion.     diazepam (VALIUM) 10 MG tablet Take 10 mg by mouth 3 (three) times daily as needed for anxiety.     dicyclomine (BENTYL) 10 MG capsule Take 1 capsule (10 mg total) by mouth 2 (two) times daily. 60 capsule 3   ezetimibe (ZETIA) 10 MG tablet Take 10 mg by mouth daily.     famotidine (PEPCID) 40 MG tablet Take 40 mg by mouth 2 (two) times daily.     fenofibrate 160 MG tablet Take 160 mg by mouth daily.     Fluticasone-Umeclidin-Vilant 100-62.5-25 MCG/ACT AEPB Take 1 puff by mouth daily.     lubiprostone (AMITIZA) 24 MCG capsule Take 1 capsule (24 mcg total) by mouth daily. 90 capsule 4   Olmesartan-amLODIPine-HCTZ 40-10-25 MG TABS Take 1 tablet by mouth daily.     oxyCODONE (ROXICODONE) 15 MG immediate release tablet Take 15 mg by mouth 4 (four) times daily as needed for pain.     pantoprazole (PROTONIX) 40 MG tablet Take 1 tablet (40 mg total) by mouth daily. 90 tablet 4   pantoprazole (PROTONIX) 40 MG tablet Take 1 tablet (40 mg total) by mouth 2 (two) times daily. 180 tablet 3   pregabalin (LYRICA) 200 MG capsule Take 200 mg by mouth 3 (three) times daily.      PROMETHEGAN 25 MG suppository Place 25 mg rectally every 6 (six) hours as needed for nausea/vomiting.     SUMAtriptan (IMITREX) 100 MG tablet Take 1 tablet by mouth 2 (two) times daily as needed for migraine.     budesonide-formoterol (SYMBICORT) 160-4.5 MCG/ACT inhaler Inhale 2 puffs into the lungs 2 (two) times daily.     carbamide peroxide (DEBROX) 6.5 % OTIC solution 5 drops 2 (two) times daily as needed (ear pain).     clopidogrel (PLAVIX) 75 MG tablet Take 75 mg by mouth daily. (Patient not taking: Reported on 05/10/2022)     fluticasone (FLONASE) 50 MCG/ACT nasal spray Place 2 sprays into both nostrils daily as needed for allergies or  rhinitis.     furosemide (LASIX) 40 MG tablet Take 0.5 tablets (20 mg total) by mouth daily. (Patient taking differently: Take 40 mg by mouth 2 (two) times daily as needed for fluid.) 30 tablet 0   gemfibrozil (LOPID) 600 MG tablet Take 600 mg by mouth 2 (two) times daily. (Patient not taking: Reported on 05/19/2022)     naloxone [facility]) nasal spray 4 mg/0.1 mL Place 1 spray into the nose once as needed (over dose). (Patient not taking: Reported on 05/19/2022)     zolpidem (AMBIEN) 10 MG tablet Take 10 mg by mouth at bedtime as needed for sleep.     Current Facility-Administered Medications  Medication Dose Route Frequency Provider Last Rate Last Admin  0.9 %  sodium chloride infusion  500 mL Intravenous Continuous Jackquline Denmark, MD        Past Surgical History:  Procedure Laterality Date   ABDOMINAL HYSTERECTOMY     ABDOMINAL SURGERY     CHOLECYSTECTOMY     COLONOSCOPY  12/26/2014   Very minimal sigmoid diverticulosis. Small internal hemorrhoids.    ESOPHAGOGASTRODUODENOSCOPY  01/21/2011   Normal EGD.    THYROID SURGERY     TOTAL SHOULDER REPLACEMENT Right 09/01/2018    Family History  Problem Relation Age of Onset   Hepatitis C Sister    Rectal cancer Paternal Grandfather    Colon cancer Maternal Aunt    Rectal cancer Maternal Aunt    Esophageal cancer Neg Hx    Stomach cancer Neg Hx     Social History   Tobacco Use   Smoking status: Every Day    Types: Cigarettes   Smokeless tobacco: Never   Tobacco comments:    pt rpeorts using about 6 per day  Vaping Use   Vaping Use: Never used  Substance Use Topics   Alcohol use: No   Drug use: No    Allergies  Allergen Reactions   Morphine And Related Other (See Comments), Hives and Itching    Per her doctor Per her doctor   Acetaminophen Other (See Comments)    Liver enlargement- hep C   Ibuprofen     Per her doctor   Nsaids     Per her doctor   Prednisone Other (See Comments)    Per her doctor   Statins Other  (See Comments)    Affected liver enzymes Affected liver enzymes     Review of Systems: All systems reviewed and negative except where noted in HPI.  Has not been sleeping well.   Physical Exam:     BP (!) 171/95   Pulse (!) 58   Temp 98.9 F (37.2 C)   Ht _0  (1.676 m)   Wt 181 lb (82.1 kg)   SpO2 99%   BMI 29.21 kg/m  GENERAL:  Alert, oriented, cooperative, not in acute distress. PSYCH: :Pleasant, normal mood and affect. HEENT:  conjunctiva pink, mucous membranes moist, neck supple without masses. No jaundice. CARDIAC:  S1 S2 normal. No murmers. PULM: Normal respiratory effort, lungs CTA bilaterally, no wheezing. ABDOMEN: Mildly distended.  Generalized abdominal wall tenderness.  Bowel sounds are present.  Multiple well-healed surgical scars. Rectal exam: Deferred SKIN:  turgor, no lesions seen. Musculoskeletal:  Normal muscle tone, normal strength. NEURO: Alert and oriented x 3, no focal neurologic deficits.   Data Reviewed: I have personally reviewed following labs and imaging studies    Latest Ref Rng & Units 11/08/2021    3:46 AM 11/07/2021    4:01 AM 11/05/2021    4:01 AM  CBC  WBC 4.0 - 10.5 K/uL 4.3  5.3  7.0   Hemoglobin 12.0 - 15.0 g/dL 11.5  12.2  12.2   Hematocrit 36.0 - 46.0 % 36.4  38.1  38.2   Platelets 150 - 400 K/uL 238  252  242       Latest Ref Rng & Units 11/08/2021    3:46 AM 11/07/2021    4:01 AM 11/05/2021    4:01 AM  Hepatic Function  Total Protein 6.5 - 8.1 g/dL 6.3  6.7  6.7   Albumin 3.5 - 5.0 g/dL 3.4  3.7  3.5   AST 15 - 41 U/L 21  22  19  ALT 0 - 44 U/L _0 Alk Phosphatase 38 - 126 U/L 62  67  77   Total Bilirubin 0.3 - 1.2 mg/dL 0.4  0.5  1.0       Kathy Wahid,MD 05/19/2022, 1:53 PM   CC Harvie Junior, MD

## 2022-05-20 ENCOUNTER — Telehealth: Payer: Self-pay | Admitting: *Deleted

## 2022-05-20 NOTE — Telephone Encounter (Signed)
  Follow up Call-     05/19/2022    1:19 PM  Call back number  Post procedure Call Back phone  # [phone]  Permission to leave phone message Yes     Patient questions:  Do you have a fever, pain , or abdominal swelling? No. Pain Score  0 *  Have you tolerated food without any problems? Yes.    Have you been able to return to your normal activities? Yes.    Do you have any questions about your discharge instructions: Diet   No. Medications  No. Follow up visit  No.  Do you have questions or concerns about your Care? No.  Actions: * If pain score is 4 or above: No action needed, pain <4.

## 2022-06-01 ENCOUNTER — Encounter: Payer: Self-pay | Admitting: Gastroenterology

## 2024-09-30 ENCOUNTER — Other Ambulatory Visit (HOSPITAL_COMMUNITY): Payer: Self-pay | Admitting: Surgery

## 2024-09-30 DIAGNOSIS — T82598A Other mechanical complication of other cardiac and vascular devices and implants, initial encounter: Secondary | ICD-10-CM

## 2024-10-02 ENCOUNTER — Other Ambulatory Visit (HOSPITAL_COMMUNITY): Payer: Self-pay | Admitting: Student

## 2024-10-03 ENCOUNTER — Ambulatory Visit (HOSPITAL_COMMUNITY)
Admission: RE | Admit: 2024-10-03 | Discharge: 2024-10-03 | Disposition: A | Discharge status: Home / Self Care | Source: Ambulatory Visit | Attending: Surgery | Admitting: Surgery

## 2024-10-03 ENCOUNTER — Other Ambulatory Visit (HOSPITAL_COMMUNITY): Payer: Self-pay | Admitting: Surgery

## 2024-10-03 ENCOUNTER — Other Ambulatory Visit: Payer: Self-pay

## 2024-10-03 DIAGNOSIS — I509 Heart failure, unspecified: Secondary | ICD-10-CM | POA: Diagnosis not present

## 2024-10-03 DIAGNOSIS — T82598A Other mechanical complication of other cardiac and vascular devices and implants, initial encounter: Secondary | ICD-10-CM

## 2024-10-03 DIAGNOSIS — J449 Chronic obstructive pulmonary disease, unspecified: Secondary | ICD-10-CM | POA: Diagnosis not present

## 2024-10-03 DIAGNOSIS — Z8673 Personal history of transient ischemic attack (TIA), and cerebral infarction without residual deficits: Secondary | ICD-10-CM | POA: Insufficient documentation

## 2024-10-03 DIAGNOSIS — Y839 Surgical procedure, unspecified as the cause of abnormal reaction of the patient, or of later complication, without mention of misadventure at the time of the procedure: Secondary | ICD-10-CM | POA: Insufficient documentation

## 2024-10-03 DIAGNOSIS — G894 Chronic pain syndrome: Secondary | ICD-10-CM | POA: Insufficient documentation

## 2024-10-03 DIAGNOSIS — E785 Hyperlipidemia, unspecified: Secondary | ICD-10-CM | POA: Diagnosis not present

## 2024-10-03 DIAGNOSIS — I110 Hypertensive heart disease with heart failure: Secondary | ICD-10-CM | POA: Diagnosis not present

## 2024-10-03 DIAGNOSIS — F17210 Nicotine dependence, cigarettes, uncomplicated: Secondary | ICD-10-CM | POA: Diagnosis not present

## 2024-10-03 HISTORY — PX: IR REMOVE CV FIBRIN SHEATH: IMG699

## 2024-10-03 HISTORY — PX: IR VENOCAVAGRAM SVC: IMG679

## 2024-10-03 HISTORY — PX: IR FLUORO RM 30-60 MIN: IMG2384

## 2024-10-03 HISTORY — PX: IR US GUIDE VASC ACCESS RIGHT: IMG2390

## 2024-10-03 MED ORDER — HYDROMORPHONE HCL 1 MG/ML IJ SOLN
INTRAMUSCULAR | Status: AC
  Filled 2024-10-03: qty 1

## 2024-10-03 MED ORDER — OXYCODONE HCL 5 MG PO TABS
10.0000 mg | ORAL_TABLET | Freq: Four times a day (QID) | ORAL | Status: DC | PRN
  Administered 2024-10-03: 10 mg via ORAL
  Filled 2024-10-03: qty 2

## 2024-10-03 MED ORDER — LIDOCAINE-EPINEPHRINE (PF) 1 %-1:200000 IJ SOLN
10.0000 mL | Freq: Once | INTRAMUSCULAR | Status: AC
  Administered 2024-10-03: 10 mL

## 2024-10-03 MED ORDER — SODIUM CHLORIDE 0.9 % IV SOLN
12.5000 mg | Freq: Once | INTRAVENOUS | Status: AC
  Administered 2024-10-03: 12.5 mg via INTRAVENOUS
  Filled 2024-10-03 (×2): qty 0.5

## 2024-10-03 MED ORDER — DIPHENHYDRAMINE HCL 50 MG/ML IJ SOLN
INTRAMUSCULAR | Status: AC | PRN
  Administered 2024-10-03 (×2): 25 mg via INTRAVENOUS

## 2024-10-03 MED ORDER — LIDOCAINE-EPINEPHRINE 1 %-1:100000 IJ SOLN
INTRAMUSCULAR | Status: AC
  Filled 2024-10-03: qty 1

## 2024-10-03 MED ORDER — HEPARIN SOD (PORK) LOCK FLUSH 100 UNIT/ML IV SOLN
INTRAVENOUS | Status: AC
Start: 2024-10-03 — End: 2024-10-03
  Filled 2024-10-03: qty 5

## 2024-10-03 MED ORDER — HYDROMORPHONE HCL 1 MG/ML IJ SOLN
INTRAMUSCULAR | Status: AC | PRN
  Administered 2024-10-03 (×2): .5 mg via INTRAVENOUS

## 2024-10-03 MED ORDER — MIDAZOLAM HCL (PF) 2 MG/2ML IJ SOLN
INTRAMUSCULAR | Status: AC | PRN
Start: 2024-10-03 — End: 2024-10-03
  Administered 2024-10-03 (×3): 1 mg via INTRAVENOUS

## 2024-10-03 MED ORDER — IOHEXOL 300 MG/ML  SOLN: 100.0000 mL | Freq: Once | INTRAMUSCULAR | Status: DC | PRN

## 2024-10-03 MED ORDER — MIDAZOLAM HCL 2 MG/2ML IJ SOLN
INTRAMUSCULAR | Status: AC
  Filled 2024-10-03: qty 2

## 2024-10-03 MED ORDER — DIPHENHYDRAMINE HCL 50 MG/ML IJ SOLN
INTRAMUSCULAR | Status: AC
  Filled 2024-10-03: qty 1

## 2024-10-03 MED ORDER — SODIUM CHLORIDE 0.9 % IV SOLN
12.5000 mg | Freq: Once | INTRAVENOUS | Status: DC
  Filled 2024-10-03: qty 0.5

## 2024-10-03 MED ORDER — SODIUM CHLORIDE 0.9 % IV SOLN: INTRAVENOUS | Status: DC

## 2024-10-03 NOTE — Progress Notes (Signed)
 Spoke with PA wyatt and PA charles in regards to administering pain medication and giving IV phenergan . Discharge education provided to patient and daughter via telephone. No further questions at this time. Patient able to tolerate PO intake and ambulate. Groin site clean,dry, and intact at discharge. Patient stayed one hour after pain medication per PA charles able to discharge after IV phenergan  administered, patient is alert at time of discharge.

## 2024-10-03 NOTE — H&P (Signed)
 Chief Complaint: Port malfunction - IR consulted for port evaluation  Referring Provider(s): Janit Ade, MD  Supervising Physician: Jenna Hacker  Patient Status: [facility] - Out-pt  History of Present Illness: Caitlyn Pacheco is a 68 y.o. female with hx of HTN, HLD, CHF, stroke, COPD, Chronic pain syndrome. She reports she has had port for vascular access for the last 8 years. Originally placed by a careers adviser with [facility] in [place], [place] on the left side, then moved to the right 3-4 years ago by the same team. Pt states she gets regular monthly lab draws and port flush in [place]. They attempted to access her port on 09/23/24 with no return of blood. She reports prior malpositioned catheter tip, as well as previous fibrin sheath. No recent imaging of chest/port. Pt states her previous surgeon who placed the port is no longer with the practice she is with, and patient was referred to IR here at [place]. Pt presents today for evaluation of port malfunction.   Today with complaint of headache, back and bilateral leg pain - which is baseline with chronic pain syndrome. Has been NPO since midnight.    Patient is Full Code  Past Medical History:  Diagnosis Date   Anxiety    Blood transfusion without reported diagnosis    CHF (congestive heart failure) (HCC)    Chronic pain syndrome    COPD (chronic obstructive pulmonary disease) (HCC)    GERD (gastroesophageal reflux disease)    HCV (hepatitis C virus)    Hyperlipidemia    Hypertension    Neuromuscular disorder (HCC)    nerve damage due to stroke.   Osteoarthritis    Osteoporosis    Sleep apnea    Doesn't use CPAP   Stroke [facility]) 1996   one stroke and two TIA's   Thyroid disease     Past Surgical History:  Procedure Laterality Date   ABDOMINAL HYSTERECTOMY     ABDOMINAL SURGERY     CHOLECYSTECTOMY     COLONOSCOPY  12/26/2014   Very minimal sigmoid diverticulosis. Small internal hemorrhoids.     ESOPHAGOGASTRODUODENOSCOPY  01/21/2011   Normal EGD.    THYROID SURGERY     TOTAL SHOULDER REPLACEMENT Right 09/01/2018    Allergies: Morphine  and codeine, Acetaminophen , Ibuprofen, Nsaids, Prednisone, and Statins  Medications: Prior to Admission medications   Medication Sig Start Date End Date Taking? Authorizing Provider  albuterol  (ACCUNEB ) 1.25 MG/3ML nebulizer solution Take 3 mLs by nebulization 3 (three) times daily as needed for wheezing or shortness of breath.  10/31/11  Yes [provider]  albuterol  (VENTOLIN  HFA) 108 (90 Base) MCG/ACT inhaler Inhale 2 puffs into the lungs every 6 (six) hours as needed for wheezing or shortness of breath.   Yes [provider]  aspirin  81 MG chewable tablet Chew 81 mg by mouth daily. 08/13/12  Yes [provider]  bismuth subsalicylate (PEPTO BISMOL) 262 MG/15ML suspension Take 30 mLs by mouth every 6 (six) hours as needed for indigestion.   Yes [provider]  budesonide-formoterol  (SYMBICORT) 160-4.5 MCG/ACT inhaler Inhale 2 puffs into the lungs 2 (two) times daily.   Yes [provider]  carbamide peroxide (DEBROX) 6.5 % OTIC solution 5 drops 2 (two) times daily as needed (ear pain).   Yes [provider]  diazepam  (VALIUM ) 10 MG tablet Take 10 mg by mouth 3 (three) times daily as needed for anxiety. 10/05/21  Yes [provider]  dicyclomine  (BENTYL ) 10 MG capsule Take 1  capsule (10 mg total) by mouth 2 (two) times daily. 05/10/22  Yes Charlanne Groom, MD  ezetimibe (ZETIA) 10 MG tablet Take 10 mg by mouth daily. 10/19/21  Yes [provider]  famotidine  (PEPCID ) 40 MG tablet Take 40 mg by mouth 2 (two) times daily. 09/29/21  Yes [provider]  fenofibrate  160 MG tablet Take 160 mg by mouth daily. 07/22/21  Yes [provider]  Fluticasone -Umeclidin-Vilant 100-62.5-25 MCG/ACT AEPB Take 1 puff by mouth daily. 08/17/21  Yes [provider]  lubiprostone   (AMITIZA ) 24 MCG capsule Take 1 capsule (24 mcg total) by mouth daily. 12/27/21  Yes Charlanne Groom, MD  oxyCODONE  (ROXICODONE ) 15 MG immediate release tablet Take 15 mg by mouth 4 (four) times daily as needed for pain.   Yes [provider]  pantoprazole  (PROTONIX ) 40 MG tablet Take 1 tablet (40 mg total) by mouth daily. 12/27/21  Yes Charlanne Groom, MD  pregabalin  (LYRICA ) 200 MG capsule Take 200 mg by mouth 3 (three) times daily.    Yes [provider]  PROMETHEGAN 25 MG suppository Place 25 mg rectally every 6 (six) hours as needed for nausea/vomiting. 09/30/21  Yes [provider]  SUMAtriptan  (IMITREX ) 100 MG tablet Take 1 tablet by mouth 2 (two) times daily as needed for migraine. 07/12/21  Yes [provider]  zolpidem (AMBIEN) 10 MG tablet Take 10 mg by mouth at bedtime as needed for sleep. 10/13/21  Yes [provider]  clopidogrel  (PLAVIX ) 75 MG tablet Take 75 mg by mouth daily. Patient not taking: Reported on 05/10/2022    [provider]  fluticasone  (FLONASE ) 50 MCG/ACT nasal spray Place 2 sprays into both nostrils daily as needed for allergies or rhinitis.    [provider]  furosemide  (LASIX ) 40 MG tablet Take 0.5 tablets (20 mg total) by mouth daily. Patient taking differently: Take 40 mg by mouth 2 (two) times daily as needed for fluid. 04/08/17   Dhungel, Nishant, MD  gemfibrozil (LOPID) 600 MG tablet Take 600 mg by mouth 2 (two) times daily. Patient not taking: Reported on 05/19/2022 10/05/21   [provider]  naloxone [facility]) nasal spray 4 mg/0.1 mL Place 1 spray into the nose once as needed (over dose). Patient not taking: Reported on 05/19/2022 02/18/17   [provider]  Olmesartan -amLODIPine -HCTZ 40-10-25 MG TABS Take 1 tablet by mouth daily. 10/22/21   [provider]  pantoprazole  (PROTONIX ) 40 MG tablet Take 1 tablet (40 mg total) by mouth 2 (two) times daily. 05/10/22   Charlanne Groom, MD      Family History  Problem Relation Age of Onset   Hepatitis C Sister    Rectal cancer Paternal Grandfather    Colon cancer Maternal Aunt    Rectal cancer Maternal Aunt    Esophageal cancer Neg Hx    Stomach cancer Neg Hx     Social History   Socioeconomic History   Marital status: Single    Spouse name: Not on file   Number of children: 3   Years of education: Not on file   Highest education level: Not on file  Occupational History   Not on file  Tobacco Use   Smoking status: Every Day    Types: Cigarettes   Smokeless tobacco: Never   Tobacco comments:    pt rpeorts using about 6 per day  Vaping Use   Vaping status: Never Used  Substance and Sexual Activity   Alcohol use: No   Drug use:  No   Sexual activity: Yes  Other Topics Concern   Not on file  Social History Narrative   Not on file   Social Drivers of Health   Financial Resource Strain: Not on file  Food Insecurity: No Food Insecurity (10/27/2023)   [facility] from [facility]   Hunger Vital Sign    Within the past 12 months, you worried that your food would run out before you got the money to buy more.: Never true    Within the past 12 months, the food you bought just didn't last and you didn't have money to get more.: Never true  Transportation Needs: No Transportation Needs (10/30/2023)   Received from [facility] - Transportation    Lack of Transportation (Medical): No    Lack of Transportation (Non-Medical): No  Physical Activity: Not on file  Stress: No Stress Concern Present (10/27/2023)   Received from [facility] - Occupational Stress Questionnaire    Feeling of Stress : Not at all  Social Connections: Not on file     Review of Systems: A 12 point ROS discussed and pertinent positives are indicated in the HPI above.  All other systems are negative.  Vital Signs: BP (!) 153/84 (BP Location: Right Arm)   Pulse 71   Temp 99 F (37.2 C)  (Oral)   Resp 16   Ht 5' 6 (1.676 m)   Wt 187 lb (84.8 kg)   SpO2 98%   BMI 30.18 kg/m   Advance Care Plan: No documents on file  Physical Exam Vitals and nursing note reviewed.  Constitutional:      Appearance: Normal appearance.  HENT:     Mouth/Throat:     Mouth: Mucous membranes are moist.     Pharynx: Oropharynx is clear.  Cardiovascular:     Rate and Rhythm: Normal rate and regular rhythm.  Pulmonary:     Effort: Pulmonary effort is normal.     Breath sounds: Normal breath sounds.  Abdominal:     Palpations: Abdomen is soft.     Tenderness: There is no abdominal tenderness.  Musculoskeletal:     Right lower leg: No edema.     Left lower leg: No edema.  Skin:    General: Skin is warm and dry.  Neurological:     Mental Status: She is alert and oriented to person, place, and time. Mental status is at baseline.     Imaging: No results found.  Labs:  CBC: No results for input(s): WBC, HGB, HCT, PLT in the last 8760 hours.  COAGS: No results for input(s): INR, APTT in the last 8760 hours.  BMP: No results for input(s): NA, K, CL, CO2, GLUCOSE, BUN, CALCIUM, CREATININE, GFRNONAA, GFRAA in the last 8760 hours.  Invalid input(s): CMP  LIVER FUNCTION TESTS: No results for input(s): BILITOT, AST, ALT, ALKPHOS, PROT, ALBUMIN in the last 8760 hours.  TUMOR MARKERS: No results for input(s): AFPTM, CEA, CA199, CHROMGRNA in the last 8760 hours.  Assessment and Plan:  Caitlyn Pacheco is a 68 y.o. female with hx of HTN, HLD, CHF, stroke, COPD, Chronic pain syndrome. She reports she has had port for vascular access for the last 8 years. Originally placed by a careers adviser with [facility] in [place], [place] on the left side, then moved to the right 3-4 years ago by the same team. Pt states she gets regular monthly lab draws and port flush in [place]. They  attempted to access her port on 09/23/24 with no return of blood.  She reports prior malpositioned catheter tip, as well as previous fibrin sheath. No recent imaging of chest/port. Pt states her previous surgeon who placed the port is no longer with the practice she is with, and patient was referred to IR here at [place]. Pt presents today for evaluation of port malfunction.   Today with complaint of headache, back and bilateral leg pain - which is baseline with chronic pain syndrome. Has been NPO since midnight.   Risks and benefits of image-guided Port-a-catheter manipulation/exchange were discussed with the patient including, but not limited to bleeding, infection, pneumothorax, or fibrin sheath development and need for additional procedures. All of the patient's questions were answered, patient is agreeable to proceed. Consent signed and in chart.   Thank you for allowing our service to participate in ROSHNI BURBANO 's care.  Electronically Signed: Kimble VEAR Clas, PA-C   10/03/2024, 11:04 AM      I spent a total of  30 Minutes   in face to face in clinical consultation, greater than 50% of which was counseling/coordinating care for port-a-catheter malfunction evaluation.

## 2024-10-04 NOTE — Progress Notes (Signed)
 Text and email sent @ [phone] Mychart not set up. No [facility] sent. Setup invited sent via text.  Chart closed @ 0847.

## 2024-10-24 NOTE — Telephone Encounter (Signed)
 Only good for 90 days,

## 2024-10-24 NOTE — Telephone Encounter (Signed)
Refaxed new order.

## 2024-10-24 NOTE — Telephone Encounter (Signed)
 Order has been refaxed.

## 2024-10-24 NOTE — Telephone Encounter (Signed)
 Kendra/Special Procedures at [facility] is calling requesting a new order for the patient for a port flush. The order that was faxed was from August 2025 and it is only good for 90 days.  Fax number [phone]  Patient was scheduled at 9:15 am but has not checked in.  Call back number [phone]  Per April, she did refax a new order with todays date on it.

## 2024-10-29 MED ORDER — IOHEXOL 300 MG/ML  SOLN
100.0000 mL | Freq: Once | INTRAMUSCULAR | Status: AC | PRN
  Administered 2024-10-03: 30 mL via INTRAVENOUS

## 2024-10-29 NOTE — Addendum Note (Signed)
 Encounter addended by: Janice Lynwood BROCKS on: 10/29/2024 8:22 AM  Actions taken: Imaging Exam ended, Order list changed, MAR administration accepted
